# Patient Record
Sex: Male | Born: 1950
Health system: Southern US, Community
[De-identification: ages and names within clinical notes are randomized; demographics above are authoritative.]

## PROBLEM LIST (undated history)

## (undated) DIAGNOSIS — Z9889 Other specified postprocedural states: Secondary | ICD-10-CM

## (undated) DIAGNOSIS — I4510 Unspecified right bundle-branch block: Secondary | ICD-10-CM

## (undated) DIAGNOSIS — E559 Vitamin D deficiency, unspecified: Secondary | ICD-10-CM

## (undated) DIAGNOSIS — G4733 Obstructive sleep apnea (adult) (pediatric): Secondary | ICD-10-CM

## (undated) DIAGNOSIS — E785 Hyperlipidemia, unspecified: Secondary | ICD-10-CM

## (undated) DIAGNOSIS — I10 Essential (primary) hypertension: Secondary | ICD-10-CM

## (undated) DIAGNOSIS — G459 Transient cerebral ischemic attack, unspecified: Secondary | ICD-10-CM

## (undated) DIAGNOSIS — I679 Cerebrovascular disease, unspecified: Secondary | ICD-10-CM

## (undated) DIAGNOSIS — I2510 Atherosclerotic heart disease of native coronary artery without angina pectoris: Secondary | ICD-10-CM

## (undated) HISTORY — DX: Obstructive sleep apnea (adult) (pediatric): G47.33

## (undated) HISTORY — DX: Vitamin D deficiency, unspecified: E55.9

## (undated) HISTORY — DX: Hyperlipidemia, unspecified: E78.5

## (undated) HISTORY — DX: Transient cerebral ischemic attack, unspecified: G45.9

## (undated) HISTORY — DX: Other specified postprocedural states: Z98.890

## (undated) HISTORY — DX: Unspecified right bundle-branch block: I45.10

---

## 2001-09-18 ENCOUNTER — Encounter: Payer: Self-pay | Admitting: Cardiology

## 2001-09-18 ENCOUNTER — Ambulatory Visit (HOSPITAL_COMMUNITY): Admission: RE | Admit: 2001-09-18 | Discharge: 2001-09-18 | Payer: Self-pay | Admitting: Cardiology

## 2001-09-22 ENCOUNTER — Ambulatory Visit (HOSPITAL_COMMUNITY): Admission: RE | Admit: 2001-09-22 | Discharge: 2001-09-22 | Payer: Self-pay | Admitting: *Deleted

## 2011-04-28 DIAGNOSIS — R002 Palpitations: Secondary | ICD-10-CM

## 2013-01-23 ENCOUNTER — Other Ambulatory Visit (INDEPENDENT_AMBULATORY_CARE_PROVIDER_SITE_OTHER): Payer: Self-pay | Admitting: Otolaryngology

## 2013-01-23 DIAGNOSIS — H905 Unspecified sensorineural hearing loss: Secondary | ICD-10-CM

## 2013-02-15 ENCOUNTER — Ambulatory Visit
Admission: RE | Admit: 2013-02-15 | Discharge: 2013-02-15 | Disposition: A | Discharge status: Home / Self Care | Payer: 59 | Source: Ambulatory Visit | Attending: Otolaryngology | Admitting: Otolaryngology

## 2013-02-15 DIAGNOSIS — H905 Unspecified sensorineural hearing loss: Secondary | ICD-10-CM

## 2013-02-15 MED ORDER — GADOBENATE DIMEGLUMINE 529 MG/ML IV SOLN
20.0000 mL | Freq: Once | INTRAVENOUS | Status: AC | PRN
  Administered 2013-02-15: 20 mL via INTRAVENOUS

## 2013-10-26 DIAGNOSIS — E559 Vitamin D deficiency, unspecified: Secondary | ICD-10-CM | POA: Insufficient documentation

## 2013-10-26 DIAGNOSIS — R5381 Other malaise: Secondary | ICD-10-CM | POA: Insufficient documentation

## 2015-10-08 ENCOUNTER — Encounter: Payer: Self-pay | Admitting: *Deleted

## 2015-10-09 ENCOUNTER — Encounter: Payer: Self-pay | Admitting: Cardiology

## 2015-10-09 ENCOUNTER — Encounter: Payer: Self-pay | Admitting: *Deleted

## 2015-10-09 ENCOUNTER — Ambulatory Visit (INDEPENDENT_AMBULATORY_CARE_PROVIDER_SITE_OTHER): Payer: 59 | Admitting: Cardiology

## 2015-10-09 VITALS — BP 158/80 | HR 67 | Ht 71.0 in | Wt 225.0 lb

## 2015-10-09 DIAGNOSIS — R030 Elevated blood-pressure reading, without diagnosis of hypertension: Secondary | ICD-10-CM | POA: Diagnosis not present

## 2015-10-09 DIAGNOSIS — R002 Palpitations: Secondary | ICD-10-CM | POA: Diagnosis not present

## 2015-10-09 DIAGNOSIS — R0600 Dyspnea, unspecified: Secondary | ICD-10-CM

## 2015-10-09 DIAGNOSIS — R55 Syncope and collapse: Secondary | ICD-10-CM

## 2015-10-09 DIAGNOSIS — IMO0001 Reserved for inherently not codable concepts without codable children: Secondary | ICD-10-CM

## 2015-10-09 DIAGNOSIS — R0609 Other forms of dyspnea: Secondary | ICD-10-CM | POA: Diagnosis not present

## 2015-10-09 DIAGNOSIS — G4733 Obstructive sleep apnea (adult) (pediatric): Secondary | ICD-10-CM

## 2015-10-09 NOTE — Patient Instructions (Signed)
Your physician recommends that you continue on your current medications as directed. Please refer to the Current Medication list given to you today. Your physician has requested that you have an echocardiogram. Echocardiography is a painless test that uses sound waves to create images of your heart. It provides your doctor with information about the size and shape of your heart and how well your heart's chambers and valves are working. This procedure takes approximately one hour. There are no restrictions for this procedure. Your physician has requested that you have en exercise stress myoview. For further information please visit [email or web address]. Please follow instruction sheet, as given. Your physician has recommended that you wear an event monitor. Event monitors are medical devices that record the heart's electrical activity. Doctors most often [place] these monitors to diagnose arrhythmias. Arrhythmias are problems with the speed or rhythm of the heartbeat. The monitor is a small, portable device. You can wear one while you do your normal daily activities. This is usually used to diagnose what is causing palpitations/syncope (passing out). Your physician recommends that you schedule a follow-up appointment in: 6 weeks.

## 2015-10-09 NOTE — Progress Notes (Signed)
Cardiology Office Note  Date: 10/09/2015   ID: Jesus Juarez, DOB Jun 06, 1950, MRN [id number]  PCP: Rory Percy, MD  Consulting Cardiologist: Rozann Lesches, MD   Chief Complaint  Patient presents with  . Exertional dyspnea  . Near Syncope  . Palpitations    History of Present Illness: Jesus Juarez is a 65 y.o. male referred for cardiology consultation by Dr. Nadara Mustard. He is here today with his wife. He states that over the last few months he has been more aware of a feeling of exertional shortness of breath and fatigue. He and his wife enjoy walking in his neighborhood, and with his typical route he reports feeling more breathless without any other obvious change in speed or effort. Also feels a vague discomfort on the right side of his chest and sometimes a brief sense of mild palpitations.  He describes a more distinct episode that occurred while he was visiting his son, was playing with his grandchild on the floor, stood up and then walked toward the door when he suddenly felt a sense of rapid palpitations and lightheadedness causing him to almost fall down to the floor. He had to catch himself. This lasted for approximately 30 seconds to a minute.  He reports previous cardiac evaluation approximately 20 years ago at which time he had normal coronary arteries at heart catheterization (I cannot locate the formal report). He has not undergone any interval ischemic or structural cardiac testing. No family history of premature CAD, but he does state that his mother has SVT.  He has also had elevations in blood pressure that are being followed by Dr. Nadara Mustard. He was not able to tolerate lisinopril approximately one year ago. He is not on any antihypertensive medicines at this time. He also has a history of long-standing obstructive sleep apnea. He does not feel rested in the morning, does use CPAP regularly. Dr. Nadara Mustard is working on getting him a follow-up sleep study assessment.  He is  retired from [employer]. Tries to stay active. No unusual degree of stress.  Past Medical History  Diagnosis Date  . Obstructive sleep apnea   . Vitamin D deficiency   . History of cardiac catheterization     Normal coronaries - approximately 20 years ago    History reviewed. No pertinent past surgical history.  Current Outpatient Prescriptions  Medication Sig Dispense Refill  . aspirin 81 MG tablet Take 81 mg by mouth. occasionally    . B Complex Vitamins (VITAMIN-B COMPLEX PO) Take by mouth. Occasionally    . cholecalciferol (VITAMIN D) 1000 units tablet Take 1,000 Units by mouth 2 (two) times daily.    . Multiple Vitamin (MULTIVITAMIN) tablet Take 1 tablet by mouth daily.     No current facility-administered medications for this visit.   Allergies:  Penicillins   Social History: The patient  reports that he has never smoked. He does not have any smokeless tobacco history on file. He reports that he does not drink alcohol or use illicit drugs.   Family History: The patient's family history includes Hypertension in his mother; Supraventricular tachycardia in his mother.   ROS:  Please see the history of present illness. Otherwise, complete review of systems is positive for occasional headaches.  All other systems are reviewed and negative.   Physical Exam: VS:  BP 178/98 mmHg  Pulse 72  Ht 5\' 11"  (1.803 m)  Wt 225 lb (102.059 kg)  BMI 31.39 kg/m2  SpO2 98%, BMI Body mass  index is 31.39 kg/(m^2).  Wt Readings from Last 3 Encounters:  10/09/15 225 lb (102.059 kg)  10/02/15 220 lb (99.791 kg)    General: Patient appears comfortable at rest. HEENT: Conjunctiva and lids normal, oropharynx clear. Neck: Supple, no elevated JVP or carotid bruits, no thyromegaly. Lungs: Clear to auscultation, nonlabored breathing at rest. Cardiac: Regular rate and rhythm, no S3 or significant systolic murmur, no pericardial rub. Abdomen: Soft, nontender, bowel sounds present, no guarding or  rebound. Extremities: No pitting edema, distal pulses 2+. Skin: Warm and dry. Musculoskeletal: No kyphosis. Neuropsychiatric: Alert and oriented x3, affect grossly appropriate.  ECG: I personally reviewed the tracing from 10/02/2015 which showed normal sinus rhythm.  Recent Labwork:  March 2017: Cholesterol 183, triglycerides 216, HDL 40, LDL 100, hemoglobin 15.3, platelets 247, BUN 16, creatinine 1.2, potassium 4.6, AST 17, ALT 14   Other Studies Reviewed Today:  Brain MRI 02/15/2013: FINDINGS: Stable and normal for age cerebral volume. No restricted diffusion to suggest acute infarction. No midline shift, mass effect, evidence of mass lesion, ventriculomegaly, extra-axial collection or acute intracranial hemorrhage. Cervicomedullary junction and pituitary are within normal limits. Negative visualized cervical spine. Major intracranial vascular flow voids are stable. Stable and normal for age gray and white matter signal throughout the brain. No abnormal gray or white matter enhancement.  There is a benign appearing right parietal bone lesion with intrinsic increased T1 and T2 signal. No abnormal diffusion. This is unchanged and compatible with a small benign osseous hemangioma. Elsewhere normal bone marrow signal. Visualized orbit soft tissues are within normal limits. Minor paranasal sinus mucosal thickening. Negative scalp soft tissues.  Dedicated internal auditory canal imaging. Normal cerebellopontine angles. Normal bilateral cisternal and intracanalicular 7th and 8th cranial nerves segments. Preserved T2 signal in the cochlea and vestibular structures. Trace mastoid fluid, more so on the left. Negative visualized nasopharynx. No abnormal enhancement identified.  IMPRESSION: 1. Normal IAC imaging aside from trace mastoid fluid, significance doubtful.  2. Stable and normal MRI appearance of the brain.  Assessment and Plan:  1. Newly recognized exertional dyspnea  and fatigue over the last few months. He has had elevated blood pressure, currently not on antihypertensive regimen, recent LDL 100. Reportedly underwent reassuring cardiac evaluation approximately 20 years ago. He also has long-standing obstructive sleep apnea and there is potential that he is not getting adequate treatment with his current CPAP regimen. He has not had any interval ischemic or structural cardiac testing and we have discussed arranging an echocardiogram as well as an exercise Cardiolite for objective evaluation.  2. Intermittent palpitations and episode of near-syncope as outlined above. I reviewed his recent ECG. We will obtain a 30 day event recorder to try to investigate this further. He does not report very frequent palpitations but the most recent event was fairly prominent.  3. Elevated blood pressure, not on long-standing antihypertensives. Reportedly did not tolerate lisinopril about one year ago. Keep follow-up with Dr. Nadara Mustard for further treatment.  4. Long-standing obstructive sleep apnea, on CPAP.  Current medicines were reviewed with the patient today.   Orders Placed This Encounter  Procedures  . NM Myocar Multi W/Spect W/Wall Motion / EF  . Cardiac event monitor  . ECHOCARDIOGRAM COMPLETE    Disposition: FU with me after testing.   Signed, Satira Sark, MD, [facility] 10/09/2015 9:43 AM    [facility] [place], [place], [place] [address] Phone: [phone]; Fax: [phone]

## 2015-10-09 NOTE — Telephone Encounter (Signed)
This encounter was created in error - please disregard.

## 2015-10-15 ENCOUNTER — Other Ambulatory Visit: Payer: Self-pay

## 2015-10-15 ENCOUNTER — Ambulatory Visit (INDEPENDENT_AMBULATORY_CARE_PROVIDER_SITE_OTHER): Payer: 59

## 2015-10-15 DIAGNOSIS — R55 Syncope and collapse: Secondary | ICD-10-CM | POA: Diagnosis not present

## 2015-10-15 DIAGNOSIS — R0609 Other forms of dyspnea: Secondary | ICD-10-CM | POA: Diagnosis not present

## 2015-10-15 DIAGNOSIS — R0600 Dyspnea, unspecified: Secondary | ICD-10-CM

## 2015-10-15 LAB — ECHOCARDIOGRAM COMPLETE
AVLVOTPG: 8 mmHg
CHL CUP MV DEC (S): 235
EERAT: 10.84
EWDT: 235 ms
FS: 30 % (ref 28–44)
IVS/LV PW RATIO, ED: 1.09
LA diam end sys: 39 mm
LA vol index: 28 mL/m2
LA vol: 64 mL
LADIAMINDEX: 1.7 cm/m2
LASIZE: 39 mm
LAVOLA4C: 63.4 mL
LV PW d: 11.7 mm — AB (ref 0.6–1.1)
LV SIMPSON'S DISK: 64
LV TDI E'LATERAL: 7.16
LV TDI E'MEDIAL: 6.58
LV dias vol index: 35 mL/m2
LV sys vol: 29 mL (ref 21–61)
LVDIAVOL: 81 mL (ref 62–150)
LVEEAVG: 10.84
LVEEMED: 10.84
LVELAT: 7.16 cm/s
LVOT VTI: 29.7 cm
LVOT area: 3.14 cm2
LVOT peak vel: 137 cm/s
LVOTD: 20 mm
LVOTSV: 93 mL
LVSYSVOLIN: 13 mL/m2
MV Peak grad: 2 mmHg
MVPKAVEL: 102 m/s
MVPKEVEL: 77.6 m/s
Stroke v: 52 ml
TAPSE: 23.6 mm

## 2015-10-16 ENCOUNTER — Telehealth: Payer: Self-pay | Admitting: *Deleted

## 2015-10-16 NOTE — Telephone Encounter (Signed)
-----   Message from Satira Sark, MD sent at 10/16/2015 11:42 AM EDT ----- Results reviewed. LVEF is normal range which is reassuring. He has mild diastolic dysfunction (LV stiffness) which could go along with elevated blood pressure and his OSA. No definite findings to explain increasing dyspnea on exertion by this test. Await stress testing. A copy of this test should be forwarded to Rory Percy, MD.

## 2015-10-16 NOTE — Telephone Encounter (Addendum)
Patient informed and copy sent to PCP. 

## 2015-10-21 ENCOUNTER — Encounter (HOSPITAL_COMMUNITY): Payer: Self-pay

## 2015-10-21 ENCOUNTER — Encounter (HOSPITAL_COMMUNITY)
Admission: RE | Admit: 2015-10-21 | Discharge: 2015-10-21 | Disposition: A | Discharge status: Home / Self Care | Payer: 59 | Source: Ambulatory Visit | Attending: Cardiology | Admitting: Cardiology

## 2015-10-21 ENCOUNTER — Inpatient Hospital Stay (HOSPITAL_COMMUNITY): Admission: RE | Admit: 2015-10-21 | Payer: 59 | Source: Ambulatory Visit

## 2015-10-21 DIAGNOSIS — R0600 Dyspnea, unspecified: Secondary | ICD-10-CM

## 2015-10-21 DIAGNOSIS — R0609 Other forms of dyspnea: Secondary | ICD-10-CM | POA: Diagnosis not present

## 2015-10-21 DIAGNOSIS — R55 Syncope and collapse: Secondary | ICD-10-CM | POA: Diagnosis present

## 2015-10-21 HISTORY — DX: Essential (primary) hypertension: I10

## 2015-10-21 LAB — NM MYOCAR MULTI W/SPECT W/WALL MOTION / EF
CHL CUP NUCLEAR SDS: 2
CHL CUP NUCLEAR SRS: 0
CHL CUP NUCLEAR SSS: 2
Estimated workload: 10.1 METS
Exercise duration (min): 8 min
Exercise duration (sec): 14 s
LHR: 0.3
LV sys vol: 37 mL
LVDIAVOL: 90 mL (ref 62–150)
MPHR: 156 {beats}/min
Peak HR: 142 {beats}/min
Percent HR: 91 %
RPE: 13
Rest HR: 62 {beats}/min
TID: 1.01

## 2015-10-21 MED ORDER — REGADENOSON 0.4 MG/5ML IV SOLN
INTRAVENOUS | Status: AC
  Filled 2015-10-21: qty 5

## 2015-10-21 MED ORDER — SODIUM CHLORIDE 0.9% FLUSH
INTRAVENOUS | Status: AC
  Administered 2015-10-21: 10 mL via INTRAVENOUS
  Filled 2015-10-21: qty 10

## 2015-10-21 MED ORDER — TECHNETIUM TC 99M TETROFOSMIN IV KIT
30.0000 | PACK | Freq: Once | INTRAVENOUS | Status: AC | PRN
  Administered 2015-10-21: 30.5 via INTRAVENOUS

## 2015-10-21 MED ORDER — TECHNETIUM TC 99M TETROFOSMIN IV KIT
10.0000 | PACK | Freq: Once | INTRAVENOUS | Status: AC | PRN
  Administered 2015-10-21: 10.9 via INTRAVENOUS

## 2015-10-22 ENCOUNTER — Ambulatory Visit (INDEPENDENT_AMBULATORY_CARE_PROVIDER_SITE_OTHER): Payer: 59

## 2015-10-22 ENCOUNTER — Telehealth: Payer: Self-pay | Admitting: Cardiology

## 2015-10-22 DIAGNOSIS — R55 Syncope and collapse: Secondary | ICD-10-CM

## 2015-10-22 DIAGNOSIS — R002 Palpitations: Secondary | ICD-10-CM | POA: Diagnosis not present

## 2015-10-22 NOTE — Telephone Encounter (Signed)
Copy to pmd.   

## 2015-10-22 NOTE — Telephone Encounter (Signed)
Would like results of stress test

## 2015-10-22 NOTE — Telephone Encounter (Signed)
Notes Recorded by Laurine Blazer, LPN on 075-GRM at 624THL PM Patient notified via voice mail.  -------------------------------------------------------------------------------------------- Satira Sark, MD  Sent: Wed October 22, 2015 4:13 PM   To: Laurine Blazer, LPN    Sometimes there can be abnormal ST segment changes on the exercise portion of a Cardiolite study that do not necessarily correspond to a perfusion abnormality or diagnostic CAD. In this case, these changes are a less concerning abnormality when the actual perfusion images are normal.  Notes Recorded by Laurine Blazer, LPN on 075-GRM at 579FGE PM Patient notified. Patient wanting more explanation in regards to the abnormal ST segment changes noted. Did explain nonspecific finding in regards to normal stress test without any perfusion defects.  Notes Recorded by Satira Sark, MD on 10/21/2015 at 3:00 PM Results reviewed. Reassuring overall in that the perfusion imaging was normal as was LVEF. The abnormal ST segment changes are noted, however less specific in the setting of normal myocardial perfusion. Do not necessarily need to push forward with further cardiac testing, although if his symptoms continue and do not improve with treatment of OSA, we can always discuss a cardiac catheterization. A copy of this test should be forwarded to Rory Percy, MD.

## 2015-11-27 NOTE — Progress Notes (Deleted)
Cardiology Office Note  Date: 11/27/2015   ID: Jesus Juarez, DOB 1950-10-13, MRN [id number]  PCP: Rory Percy, MD  Primary Cardiologist: Rozann Lesches, MD   No chief complaint on file.   History of Present Illness: Jesus Juarez is a 65 y.o. male seen in consultation back in June for evaluation of exertional dyspnea and fatigue as well as an episode of palpitations and reported near syncope. He was referred for further cardiac testing.  Echocardiogram and Myoview results are outlined below. He had normal LVEF with mild diastolic dysfunction and no major valvular abnormalities. Abnormal ST segment changes were seen on GXT portion of his stress test, however perfusion imaging was normal. He also wore a 30 day event recorder which showed sinus rhythm and sinus tachycardia, no arrhythmias or pauses.   Past Medical History:  Diagnosis Date  . History of cardiac catheterization    Normal coronaries - approximately 20 years ago  . Hypertension   . Obstructive sleep apnea   . Vitamin D deficiency     No past surgical history on file.  Current Outpatient Prescriptions  Medication Sig Dispense Refill  . aspirin 81 MG tablet Take 81 mg by mouth. occasionally    . B Complex Vitamins (VITAMIN-B COMPLEX PO) Take by mouth. Occasionally    . cholecalciferol (VITAMIN D) 1000 units tablet Take 1,000 Units by mouth 2 (two) times daily.    . Multiple Vitamin (MULTIVITAMIN) tablet Take 1 tablet by mouth daily.     No current facility-administered medications for this visit.    Allergies:  Penicillins   Social History: The patient  reports that he has never smoked. He does not have any smokeless tobacco history on file. He reports that he does not drink alcohol or use drugs.   Family History: The patient's family history includes Hypertension in his mother; Supraventricular tachycardia in his mother.   ROS:  Please see the history of present illness. Otherwise, complete review of  systems is positive for {NONE DEFAULTED:18576::"none"}.  All other systems are reviewed and negative.   Physical Exam: VS:  There were no vitals taken for this visit., BMI There is no height or weight on file to calculate BMI.  Wt Readings from Last 3 Encounters:  10/09/15 225 lb (102.1 kg)  10/02/15 220 lb (99.8 kg)    General: Patient appears comfortable at rest. HEENT: Conjunctiva and lids normal, oropharynx clear with moist mucosa. Neck: Supple, no elevated JVP or carotid bruits, no thyromegaly. Lungs: Clear to auscultation, nonlabored breathing at rest. Cardiac: Regular rate and rhythm, no S3 or significant systolic murmur, no pericardial rub. Abdomen: Soft, nontender, no hepatomegaly, bowel sounds present, no guarding or rebound. Extremities: No pitting edema, distal pulses 2+. Skin: Warm and dry. Musculoskeletal: No kyphosis. Neuropsychiatric: Alert and oriented x3, affect grossly appropriate.  ECG: I personally reviewed the tracing from 10/02/2015 which showed normal sinus rhythm.  Recent Labwork:  March 2017: Cholesterol 183, triglycerides 216, HDL 40, LDL 100, hemoglobin 15.3, platelets 247, BUN 16, creatinine 1.2, potassium 4.6, AST 17, ALT 14   Other Studies Reviewed Today:  Echocardiogram 10/15/2015: Study Conclusions  - Left ventricle: The cavity size was normal. Wall thickness was   increased in a pattern of mild LVH. Systolic function was normal.   The estimated ejection fraction was in the range of 60% to 65%.   Wall motion was normal; there were no regional wall motion   abnormalities. Doppler parameters are consistent with abnormal  left ventricular relaxation (grade 1 diastolic dysfunction). - Mitral valve: Calcified annulus. - Left atrium: The atrium was at the upper limits of normal in   size. - Right atrium: The atrium was mildly dilated. Central venous   pressure (est): 3 mm Hg. - Atrial septum: No defect or patent foramen ovale was identified. -  Tricuspid valve: There was physiologic regurgitation. - Pulmonary arteries: PA peak pressure: 16 mm Hg (S). - Pericardium, extracardiac: There was no pericardial effusion.  Impressions:  - Mild LVH with LVEF 60-65%. Grade 1 diastolic dysfunction with   normal estimated LV filling pressure. Mildly calcified mitral   annulus. Upper normal left atrial chamber size. Normal estimated   PASP 16 mmHg.  Exercise Myoview 10/21/2015:  Blood pressure demonstrated a hypertensive response to exercise.  Horizontal ST segment depression ST segment depression of 2 mm was noted during stress in the II, III and aVF leads. Nonspecific finding in setting of hypertensive response  The study is normal. There are no myocardial perfusion defects.  This is a low risk study.  The left ventricular ejection fraction is normal (55-65%).  Assessment and Plan:    Current medicines were reviewed with the patient today.  No orders of the defined types were placed in this encounter.   Disposition:  Signed, Satira Sark, MD, [facility] 11/27/2015 8:00 AM    [facility] [place], [place], [place] [address] Phone: [phone]; Fax: [phone]

## 2015-11-28 ENCOUNTER — Ambulatory Visit: Payer: 59 | Admitting: Cardiology

## 2015-11-28 ENCOUNTER — Telehealth: Payer: Self-pay | Admitting: Cardiology

## 2015-11-28 NOTE — Telephone Encounter (Signed)
Patient informed and copy sent to PCP. 

## 2015-11-28 NOTE — Telephone Encounter (Signed)
-----   Message from Satira Sark, MD sent at 11/27/2015  8:36 AM EDT ----- Results reviewed. Will discuss at office visit pending for tomorrow. No arrhythmias were noted. A copy of this test should be forwarded to Rory Percy, MD.

## 2015-11-28 NOTE — Telephone Encounter (Signed)
Jesus Juarez had to cancel his appointment today due to be out of town.  He has re-scheduled. He would like to know the results of his heart monitor.  Please call cell #.

## 2015-12-30 ENCOUNTER — Encounter: Payer: Self-pay | Admitting: Cardiology

## 2015-12-30 ENCOUNTER — Ambulatory Visit (INDEPENDENT_AMBULATORY_CARE_PROVIDER_SITE_OTHER): Payer: 59 | Admitting: Cardiology

## 2015-12-30 VITALS — BP 175/91 | HR 73 | Ht 71.0 in | Wt 225.2 lb

## 2015-12-30 DIAGNOSIS — R0609 Other forms of dyspnea: Secondary | ICD-10-CM | POA: Diagnosis not present

## 2015-12-30 DIAGNOSIS — IMO0001 Reserved for inherently not codable concepts without codable children: Secondary | ICD-10-CM

## 2015-12-30 DIAGNOSIS — G4733 Obstructive sleep apnea (adult) (pediatric): Secondary | ICD-10-CM

## 2015-12-30 DIAGNOSIS — R002 Palpitations: Secondary | ICD-10-CM | POA: Diagnosis not present

## 2015-12-30 DIAGNOSIS — R030 Elevated blood-pressure reading, without diagnosis of hypertension: Secondary | ICD-10-CM

## 2015-12-30 DIAGNOSIS — Z9989 Dependence on other enabling machines and devices: Secondary | ICD-10-CM

## 2015-12-30 DIAGNOSIS — R0600 Dyspnea, unspecified: Secondary | ICD-10-CM

## 2015-12-30 NOTE — Patient Instructions (Addendum)
Your physician recommends that you schedule a follow-up appointment in: [place]  Your physician recommends that you continue on your current medications as directed. Please refer to the Current Medication list given to you today.  You have been referred to PULMONOLOGY   Thank you for choosing [facility]!!

## 2015-12-30 NOTE — Progress Notes (Signed)
Cardiology Office Note  Date: 12/30/2015   ID: Jesus Juarez, DOB [birth year], MRN [id number]  PCP: Jesus Percy, MD  Primary Cardiologist: Jesus Lesches, MD   Chief Complaint  Patient presents with  . Follow-up testing    History of Present Illness: Jesus Juarez is a 65 y.o. male seen in consultation back in June. He is a day with his wife for a follow-up visit. He does not report any progression and dyspnea exertion or episodes of orthostatic dizziness in the interim. No chest pain.  Echocardiogram and Myoview results are outlined below. 30 day event monitor was also reviewed showing sinus rhythm and sinus tachycardia with no obvious arrhythmias or pauses. He did have follow-up sleep testing as well with known history of severe OSA - this was a study done at home. Copy to be scanned into EPIC.  Today we went over the results of his testing. He has normal LVEF with some diastolic dysfunction, and I do think better blood pressure control will be important. He has been hesitant to try another antihypertensive having not tolerated lisinopril in the past. ARB may be a better choice, possibly even with a low dose diuretic. For now he wanted to hold off. He also needs evaluation for his sleep apnea management and we will be making a referral for further evaluation.  I suspect a sleep titration study may be needed since he is already on CPAP.  Past Medical History:  Diagnosis Date  . History of cardiac catheterization    Normal coronaries - approximately 20 years ago  . Hypertension   . Obstructive sleep apnea   . Vitamin D deficiency     History reviewed. No pertinent surgical history.  Current Outpatient Prescriptions  Medication Sig Dispense Refill  . aspirin 81 MG tablet Take 81 mg by mouth. occasionally    . B Complex Vitamins (VITAMIN-B COMPLEX PO) Take by mouth. Occasionally    . cholecalciferol (VITAMIN D) 1000 units tablet Take 1,000 Units by mouth 2 (two) times daily.     . Multiple Vitamin (MULTIVITAMIN) tablet Take 1 tablet by mouth daily.     No current facility-administered medications for this visit.    Allergies:  Penicillins   Social History: The patient  reports that he has never smoked. He has never used smokeless tobacco. He reports that he does not drink alcohol or use drugs.   ROS:  Please see the history of present illness. Otherwise, complete review of systems is positive for none.  All other systems are reviewed and negative.   Physical Exam: VS:  BP (!) 175/91   Pulse 73   Ht 5\' 11"  (1.803 m)   Wt 225 lb 3.2 oz (102.2 kg)   SpO2 97%   BMI 31.41 kg/m , BMI Body mass index is 31.41 kg/m.  Wt Readings from Last 3 Encounters:  12/30/15 225 lb 3.2 oz (102.2 kg)  10/09/15 225 lb (102.1 kg)  10/02/15 220 lb (99.8 kg)    General: Patient appears comfortable at rest. HEENT: Conjunctiva and lids normal, oropharynx clear. Neck: Supple, no elevated JVP or carotid bruits, no thyromegaly. Lungs: Clear to auscultation, nonlabored breathing at rest. Cardiac: Regular rate and rhythm, no S3 or significant systolic murmur, no pericardial rub. Abdomen: Soft, nontender, bowel sounds present, no guarding or rebound. Extremities: No pitting edema, distal pulses 2+.  ECG: I personally reviewed the tracing from 10/02/2015 which showed normal sinus rhythm.  Recent Labwork:  March 2017: Cholesterol 183, triglycerides  216, HDL 40, LDL 100, hemoglobin 15.3, platelets 247, BUN 16, creatinine 1.2, potassium 4.6, AST 17, ALT 14   Other Studies Reviewed Today:  Echocardiogram 10/15/2015: Study Conclusions  - Left ventricle: The cavity size was normal. Wall thickness was   increased in a pattern of mild LVH. Systolic function was normal.   The estimated ejection fraction was in the range of 60% to 65%.   Wall motion was normal; there were no regional wall motion   abnormalities. Doppler parameters are consistent with abnormal   left ventricular  relaxation (grade 1 diastolic dysfunction). - Mitral valve: Calcified annulus. - Left atrium: The atrium was at the upper limits of normal in   size. - Right atrium: The atrium was mildly dilated. Central venous   pressure (est): 3 mm Hg. - Atrial septum: No defect or patent foramen ovale was identified. - Tricuspid valve: There was physiologic regurgitation. - Pulmonary arteries: PA peak pressure: 16 mm Hg (S). - Pericardium, extracardiac: There was no pericardial effusion.  Impressions:  - Mild LVH with LVEF 60-65%. Grade 1 diastolic dysfunction with   normal estimated LV filling pressure. Mildly calcified mitral   annulus. Upper normal left atrial chamber size. Normal estimated   PASP 16 mmHg.  Exercise Myoview 10/21/2015:  Blood pressure demonstrated a hypertensive response to exercise.  Horizontal ST segment depression ST segment depression of 2 mm was noted during stress in the II, III and aVF leads. Nonspecific finding in setting of hypertensive response  The study is normal. There are no myocardial perfusion defects.  This is a low risk study.  The left ventricular ejection fraction is normal (55-65%).  Assessment and Plan:  1. Severe obstructive sleep apnea on CPAP. Recent home sleep evaluation results are being scanned into EPIC. We are referring him for further evaluation of his OSA, he may need to have a sleep titration study.  2. Elevated blood pressure. We again talked about antihypertensive therapy, also hoping that perhaps better control of his OSA will be helpful. He is hesitant to start new medications now. We may consider an ARB plus minus low-dose diuretic. This would also be important with diastolic dysfunction and could help with dyspnea on exertion.  3. Still expenses intermittent brief palpitations but has had no recurring dizziness, and no syncope. Cardiac monitor did not demonstrate any arrhythmias.  Current medicines were reviewed with the patient  today.   Orders Placed This Encounter  Procedures  . Ambulatory referral to Pulmonology    Disposition: Follow-up with me in 3 months.  Signed, Jesus Sark, MD, [facility] 12/30/2015 4:17 PM    [facility] [place], [place], [place] [address] Phone: [phone]; Fax: [phone]

## 2016-03-08 ENCOUNTER — Ambulatory Visit (INDEPENDENT_AMBULATORY_CARE_PROVIDER_SITE_OTHER): Payer: 59 | Admitting: Pulmonary Disease

## 2016-03-08 ENCOUNTER — Encounter: Payer: Self-pay | Admitting: Pulmonary Disease

## 2016-03-08 DIAGNOSIS — G4733 Obstructive sleep apnea (adult) (pediatric): Secondary | ICD-10-CM

## 2016-03-08 NOTE — Progress Notes (Signed)
Subjective:    Patient ID: Jesus Juarez, male    DOB: 25-Oct-1950, 65 y.o.   MRN: [id number]  HPI   Chief Complaint  Patient presents with  . Sleep Consult    Referred by Dr. Domenic Polite,  Pt. has had a sleep study 2-3 months ago, witnessed apneas, Pt. does currently uses a CPAP machine, Pt. c/o daytime somnolence, still waking up frequentlly at night, DME: [facility]    65 year old never smoker presents for management of obstructive sleep apnea. He was diagnosed more than 15 years ago and since then is maintained on CPAP about 11 cm with a full face mask with good improvement in his daytime somnolence and energy levels. He had an episode of presyncope about 4 months ago and underwent cardiology evaluation including stress test, echo and a 30 day Holter monitor-which did not reveal a cause. PCP undertook home sleep testing 12/2015 which showed severe OSA with AHI 49/hour. 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 Bedtime is around 10 PM, sleep latency is minimal, he sleeps on his side with one pillow, reports 3-4 nocturnal awakenings and is out of bed by 7 AM feeling tired without dryness of mouth or headaches. He denies nocturia He is not gained significant weight over the past few years. His wife sleeps in a different room but has not heard snoring through his CPAP mask   Past Medical History:  Diagnosis Date  . History of cardiac catheterization    Normal coronaries - approximately 20 years ago  . Hypertension   . Obstructive sleep apnea   . Vitamin D deficiency      No past surgical history on file.   Allergies  Allergen Reactions  . Penicillins Hives     Social History   Social History  . Marital  status: Married    Spouse name: N/A  . Number of children: N/A  . Years of education: N/A   Occupational History  . Not on file.   Social History Main Topics  . Smoking status: Never Smoker  . Smokeless tobacco: Never Used  . Alcohol use No  . Drug use: No  . Sexual activity: Not on file   Other Topics Concern  . Not on file   Social History Narrative  . No narrative on file     Family History  Problem Relation Age of Onset  . Supraventricular tachycardia Mother   . Hypertension Mother       Review of Systems Constitutional: negative for anorexia, fevers and sweats  Eyes: negative for irritation, redness and visual disturbance  Ears, nose, mouth, throat, and face: negative for earaches, epistaxis, nasal congestion and sore throat  Respiratory: negative for cough, dyspnea on exertion, sputum and wheezing  Cardiovascular: negative for chest pain, dyspnea, lower extremity edema, orthopnea, palpitations and syncope  Gastrointestinal: negative for abdominal pain, constipation, diarrhea, melena, nausea and vomiting  Genitourinary:negative for dysuria, frequency and hematuria  Hematologic/lymphatic: negative for bleeding, easy bruising and lymphadenopathy  Musculoskeletal:negative for arthralgias, muscle weakness and stiff joints  Neurological: negative for coordination problems, gait problems, headaches and weakness  Endocrine: negative for diabetic symptoms including polydipsia, polyuria and weight loss     Objective:  Physical Exam  Gen. Pleasant, well-nourished, in no distress, normal affect ENT - no lesions, no post nasal drip, class 2 airway Neck: No JVD, no thyromegaly, no carotid bruits Lungs: no use of accessory muscles, no dullness to percussion, clear without rales or rhonchi  Cardiovascular: Rhythm regular, heart sounds  normal, no murmurs or gallops, no peripheral edema Abdomen: soft and non-tender, no hepatosplenomegaly, BS normal. Musculoskeletal: No  deformities, no cyanosis or clubbing Neuro:  alert, non focal       Assessment & Plan:

## 2016-03-08 NOTE — Progress Notes (Signed)
   Subjective:    Patient ID: Jesus Juarez, male    DOB: 1950/08/12, 65 y.o.   MRN: [id number]  HPI    Review of Systems  Constitutional: Negative.  Negative for fever and unexpected weight change.  HENT: Positive for sinus pressure. Negative for congestion, dental problem, ear pain, nosebleeds, postnasal drip, rhinorrhea, sneezing, sore throat and trouble swallowing.   Eyes: Negative.  Negative for redness and itching.  Respiratory: Positive for shortness of breath. Negative for cough, chest tightness and wheezing.   Cardiovascular: Negative.  Negative for palpitations and leg swelling.  Gastrointestinal: Negative.  Negative for nausea and vomiting.  Endocrine: Negative.   Genitourinary: Negative.  Negative for dysuria.  Musculoskeletal: Negative.  Negative for joint swelling.  Skin: Negative.  Negative for rash.  Allergic/Immunologic: Positive for environmental allergies.  Neurological: Positive for dizziness. Negative for headaches.  Hematological: Negative.  Does not bruise/bleed easily.  Psychiatric/Behavioral: Negative.  Negative for dysphoric mood. The patient is not nervous/anxious.        Objective:   Physical Exam        Assessment & Plan:

## 2016-03-08 NOTE — Patient Instructions (Signed)
CPAP titration study

## 2016-03-08 NOTE — Assessment & Plan Note (Signed)
Weight loss encouraged, compliance with goal of at least 4-6 hrs every night is the expectation. Advised against medications with sedative side effects Cautioned against driving when sleepy - understanding that sleepiness will vary on a day to day basis   Given that it's been so many years we'll proceed with a CPAP titration study We will also obtain a download of his machine and ensure that his settings are appropriate I reassured him that OSA is not related to his episode of syncope

## 2016-03-15 ENCOUNTER — Telehealth: Payer: Self-pay

## 2016-03-15 NOTE — Telephone Encounter (Signed)
Tried to call Pt. About his download that Dr. Elsworth Soho review.   He stated to inform pt. Good usage. CPAP 10cm effective. No changes at this time.

## 2016-03-16 ENCOUNTER — Telehealth: Payer: Self-pay | Admitting: Pulmonary Disease

## 2016-03-16 NOTE — Telephone Encounter (Signed)
Returned Pt. Call, another phone message was placed and sent to Dr. Elsworth Soho

## 2016-03-16 NOTE — Telephone Encounter (Signed)
Dr. Elsworth Soho  I informed this pt. About your message when you took a look his download and stated "Good usage. CPAP 10cm effective. No changes at this time." He is suppose to have a CPAP Titration 04/23/2016. Since he is doing well on his current therapy, he wanted to know if you still wanted him to go through with this.

## 2016-03-17 NOTE — Telephone Encounter (Signed)
Spoke with patient-aware that CPAP titration can be cancelled per RA since current settings confirmed okay on most recent download.   Pt noted fatigue going on and wonders if having CPAP titration could show he may need different settings. States his PCP questions if his sleep and CPAP usage/pressure settings are causing the fatigue. Pt is willing to have CPAP titration.   RA can we keep order placed? Thanks.

## 2016-03-17 NOTE — Telephone Encounter (Signed)
Spoke with the pt  He already has appt to have CPAP titration  Nothing further needed

## 2016-03-17 NOTE — Telephone Encounter (Signed)
Okay to proceed with CPAP titration

## 2016-03-17 NOTE — Telephone Encounter (Signed)
OK to cancel - since current settings  Confirmed OK

## 2016-03-22 ENCOUNTER — Encounter: Payer: Self-pay | Admitting: Pulmonary Disease

## 2016-04-12 ENCOUNTER — Ambulatory Visit: Payer: 59 | Admitting: Cardiology

## 2016-04-22 ENCOUNTER — Telehealth: Payer: Self-pay | Admitting: *Deleted

## 2016-04-22 NOTE — Telephone Encounter (Signed)
-----   Message from Rigoberto Noel, MD sent at 04/22/2016  2:33 PM EST ----- Nida Manfredi, Pl let him know Also CPAP 10 cm seems to be adequate based on download  RA ----- Message ----- From: Joellen Jersey Sent: 04/22/2016  12:23 PM To: Rigoberto Noel, MD, Virl Cagey, CMA  [employer] HAS DENIED THIS PT'S CPAP TITRATION STUDY I HAVE CANCELLED THE APPT NOT SURE WHAT YOU WANT TO DO FOR HIM HE IS AWARE AND WILL AWAIT TO HEAR FROM YOU THANKS LIBBY

## 2016-04-23 NOTE — Telephone Encounter (Signed)
lmtcb x1 for pt. 

## 2016-04-27 NOTE — Telephone Encounter (Signed)
Pt called back and he is aware of RA recs of the cpap download.  Pt voiced his understanding and nothing further is needed.

## 2016-04-27 NOTE — Telephone Encounter (Signed)
lmomtcb x 2  

## 2016-04-28 ENCOUNTER — Encounter (HOSPITAL_BASED_OUTPATIENT_CLINIC_OR_DEPARTMENT_OTHER): Payer: 59

## 2016-05-18 ENCOUNTER — Ambulatory Visit: Payer: 59 | Admitting: Cardiology

## 2016-05-26 DIAGNOSIS — R69 Illness, unspecified: Secondary | ICD-10-CM | POA: Diagnosis not present

## 2016-07-20 DIAGNOSIS — G4733 Obstructive sleep apnea (adult) (pediatric): Secondary | ICD-10-CM | POA: Diagnosis not present

## 2016-08-17 DIAGNOSIS — H8102 Meniere's disease, left ear: Secondary | ICD-10-CM | POA: Diagnosis not present

## 2016-08-17 DIAGNOSIS — H6123 Impacted cerumen, bilateral: Secondary | ICD-10-CM | POA: Diagnosis not present

## 2016-08-17 DIAGNOSIS — H9042 Sensorineural hearing loss, unilateral, left ear, with unrestricted hearing on the contralateral side: Secondary | ICD-10-CM | POA: Diagnosis not present

## 2016-09-29 DIAGNOSIS — R69 Illness, unspecified: Secondary | ICD-10-CM | POA: Diagnosis not present

## 2016-09-30 ENCOUNTER — Telehealth: Payer: Self-pay | Admitting: *Deleted

## 2016-09-30 NOTE — Telephone Encounter (Signed)
Pt says last night felt "fluttering" became SOB with some tightness lasting for 15 mins denied any dizziness/swelling/weight gain/chest pain - says he had coffee just prior to this episode - wife checked BP was 176/102 checked a few mins later came down 150/99 HR remained at 66. Pt says this hasn't happened since LOV 12/2015 - and denies any symptoms today hasn't checked BP this morning. Schedule appt for 6/11 with Dr Domenic Polite (pt is past due for f/u) advised pt to monitor symptoms and BP and if became more frequent or worsened to call back or evaluation at ED - routed to provider as Juluis Rainier

## 2016-10-08 ENCOUNTER — Encounter: Payer: Self-pay | Admitting: *Deleted

## 2016-10-11 ENCOUNTER — Ambulatory Visit (INDEPENDENT_AMBULATORY_CARE_PROVIDER_SITE_OTHER): Payer: Medicare HMO | Admitting: Cardiology

## 2016-10-11 ENCOUNTER — Encounter: Payer: Self-pay | Admitting: Cardiology

## 2016-10-11 VITALS — BP 186/92 | HR 67 | Ht 71.5 in | Wt 223.4 lb

## 2016-10-11 DIAGNOSIS — I10 Essential (primary) hypertension: Secondary | ICD-10-CM

## 2016-10-11 DIAGNOSIS — R69 Illness, unspecified: Secondary | ICD-10-CM | POA: Diagnosis not present

## 2016-10-11 DIAGNOSIS — R002 Palpitations: Secondary | ICD-10-CM

## 2016-10-11 NOTE — Progress Notes (Signed)
Cardiology Office Note  Date: 10/11/2016   ID: Jesus Juarez, DOB 07/30/1950, MRN [id number]  PCP: Rory Percy, MD  Primary Cardiologist: Rozann Lesches, MD   Chief Complaint  Patient presents with  . Cardiac follow-up    History of Present Illness: Jesus Juarez is a 66 y.o. male last seen in August 2017. He presents with his wife for a follow-up visit. He called the office in late May reporting an episode of sudden heart palpitations and chest pain, per telephone note there is mention that it lasted for 15 minutes, however he indicates that it was really only for seconds, certainly less than 30 seconds. He felt mildly lightheaded but did not have syncope. Interestingly, this occurred after having strong coffee in the morning, otherwise he has not been bothered by progressive palpitations or chest pain, remains very active remodeling a house and also playing soccer.  He tells me that his mother has a history of what sounds like PSVT and has required adenosine. It may well be that he has brief episodes of SVT explaining some of the symptoms. I personally reviewed his ECG today which shows normal sinus rhythm with nonspecific ST changes, no pre-excitation.  He remains hesitant to consider any long-term medications.  Past Medical History:  Diagnosis Date  . History of cardiac catheterization    Normal coronaries - approximately 20 years ago  . Hypertension   . Obstructive sleep apnea   . Vitamin D deficiency     History reviewed. No pertinent surgical history.  Current Outpatient Prescriptions  Medication Sig Dispense Refill  . B Complex Vitamins (VITAMIN-B COMPLEX PO) Take by mouth. Occasionally    . cholecalciferol (VITAMIN D) 1000 units tablet Take 1,000 Units by mouth 2 (two) times daily.    . Multiple Vitamin (MULTIVITAMIN) tablet Take 1 tablet by mouth daily.     No current facility-administered medications for this visit.    Allergies:  Penicillins and Sulfur    Social History: The patient  reports that he has never smoked. He has never used smokeless tobacco. He reports that he does not drink alcohol or use drugs.   ROS:  Please see the history of present illness. Otherwise, complete review of systems is positive for none.  All other systems are reviewed and negative.   Physical Exam: VS:  BP (!) 186/92   Pulse 67   Ht 5' 11.5" (1.816 m)   Wt 223 lb 6.4 oz (101.3 kg)   BMI 30.72 kg/m , BMI Body mass index is 30.72 kg/m.  Wt Readings from Last 3 Encounters:  10/11/16 223 lb 6.4 oz (101.3 kg)  03/08/16 224 lb (101.6 kg)  12/30/15 225 lb 3.2 oz (102.2 kg)    General: Patient appears comfortable at rest. HEENT: Conjunctiva and lids normal, oropharynx clear. Neck: Supple, no elevated JVP or carotid bruits, no thyromegaly. Lungs: Clear to auscultation, nonlabored breathing at rest. Cardiac: Regular rate and rhythm, no S3 or significant systolic murmur, no pericardial rub. Abdomen: Soft, nontender, bowel sounds present, no guarding or rebound. Extremities: No pitting edema, distal pulses 2+.  ECG: I personally reviewed the tracing from 10/02/2015 which showed normal sinus rhythm.  Recent Labwork:  March 2017: Cholesterol 183, triglycerides 216, HDL 40, LDL 100, hemoglobin 15.3, platelets 247, BUN 16, creatinine 1.2, potassium 4.6, AST 17, ALT 14   Other Studies Reviewed Today:  Echocardiogram 10/15/2015: Study Conclusions  - Left ventricle: The cavity size was normal. Wall thickness was increased in a  pattern of mild LVH. Systolic function was normal. The estimated ejection fraction was in the range of 60% to 65%. Wall motion was normal; there were no regional wall motion abnormalities. Doppler parameters are consistent with abnormal left ventricular relaxation (grade 1 diastolic dysfunction). - Mitral valve: Calcified annulus. - Left atrium: The atrium was at the upper limits of normal in size. - Right atrium: The  atrium was mildly dilated. Central venous pressure (est): 3 mm Hg. - Atrial septum: No defect or patent foramen ovale was identified. - Tricuspid valve: There was physiologic regurgitation. - Pulmonary arteries: PA peak pressure: 16 mm Hg (S). - Pericardium, extracardiac: There was no pericardial effusion.  Impressions:  - Mild LVH with LVEF 60-65%. Grade 1 diastolic dysfunction with normal estimated LV filling pressure. Mildly calcified mitral annulus. Upper normal left atrial chamber size. Normal estimated PASP 16 mmHg.  Exercise Myoview 10/21/2015:  Blood pressure demonstrated a hypertensive response to exercise.  Horizontal ST segment depression ST segment depression of 2 mm was noted during stress in the II, III and aVF leads. Nonspecific finding in setting of hypertensive response  The study is normal. There are no myocardial perfusion defects.  This is a low risk study.  The left ventricular ejection fraction is normal (55-65%).  Assessment and Plan:  1. Intermittent palpitations and chest pain, could be brief episodes of PSVT although this has not been documented. We will continue observation for now. I have asked him to let me know if he experiences increasing frequency or duration as we may do repeat monitoring. He does not want to take any standing medications at this time. Ischemic workup from June of last year was low risk. LVEF is normal range.  2. History of hypertension, blood pressure elevated today. He is hesitant to take any medications, I have asked him to follow-up with Dr. Nadara Mustard.  Current medicines were reviewed with the patient today.   Orders Placed This Encounter  Procedures  . EKG 12-Lead    Disposition: Follow-up in one year, sooner if needed.  Signed, Satira Sark, MD, [facility] 10/11/2016 4:20 PM    [facility] [place], [place], [place] [address] Phone: [phone]; Fax: [phone]

## 2016-10-11 NOTE — Patient Instructions (Signed)

## 2016-11-08 DIAGNOSIS — G4733 Obstructive sleep apnea (adult) (pediatric): Secondary | ICD-10-CM | POA: Diagnosis not present

## 2016-12-14 DIAGNOSIS — H8102 Meniere's disease, left ear: Secondary | ICD-10-CM | POA: Diagnosis not present

## 2016-12-14 DIAGNOSIS — H6123 Impacted cerumen, bilateral: Secondary | ICD-10-CM | POA: Diagnosis not present

## 2016-12-15 ENCOUNTER — Telehealth: Payer: Self-pay | Admitting: Cardiology

## 2016-12-15 NOTE — Telephone Encounter (Signed)
There is no specific cardiac contraindication for him to use prednisone. He is correct in his concerns about the potential for it to increase blood pressure, although this is usually temporary. Since he has already had elevated blood pressure documented, he will need to keep an eye on this and if necessary see Dr. Nadara Mustard.

## 2016-12-15 NOTE — Telephone Encounter (Signed)
Patient has been predrisone 10mg    Starting with 6 doses For ear inflamation. Would like to know if he is ok to start this medication

## 2016-12-15 NOTE — Telephone Encounter (Signed)
Patient was prescribed prednisone 10 mg due to ear inflammation and is supposed to take 6 tablets today. Patient knows this can raise blood pressure and wants to know if it is ok for him to take.

## 2016-12-15 NOTE — Telephone Encounter (Signed)
Patients wife Mateo Flow notified and verbalized understanding.

## 2016-12-16 ENCOUNTER — Telehealth: Payer: Self-pay

## 2016-12-16 NOTE — Telephone Encounter (Signed)
If he is having acute symptoms with elevated blood pressure, it would be reasonable for him to go to the ER to see if he needs any treatment to bring down his blood pressure. We have talked about hypertension before, and I have recommended to him that he may need to consider taking a regular antihypertensive under the direction of Dr. Nadara Mustard. I know that he has been hesitant to take medications in general. As mentioned yesterday, steroids can further contribute to elevated blood pressure. He still needs to try to get further recommendations from [facility] regarding whether he should stay on the steroids and if a perhaps a medication needs to be started for his blood pressure.

## 2016-12-16 NOTE — Telephone Encounter (Signed)
Patient notified and verbalized understanding. 

## 2016-12-16 NOTE — Telephone Encounter (Signed)
Patients wife contacted office stating that after starting prednisone today (2 with breakfast 1 with lunch) patient started feeling very clammy and felt like BP was elevated. Wife checked BP it was 199/102 HR 93. Advised patient per Dr. Chuck Hint recommendation yesterday to contact PCP. Wife states that was done but they have not heard back and were very concerned with this high BP. BP rechecked while on the phone 198/103 HR 87

## 2016-12-21 DIAGNOSIS — H8102 Meniere's disease, left ear: Secondary | ICD-10-CM | POA: Diagnosis not present

## 2016-12-21 DIAGNOSIS — H903 Sensorineural hearing loss, bilateral: Secondary | ICD-10-CM | POA: Diagnosis not present

## 2016-12-21 DIAGNOSIS — H6122 Impacted cerumen, left ear: Secondary | ICD-10-CM | POA: Diagnosis not present

## 2017-02-08 DIAGNOSIS — R69 Illness, unspecified: Secondary | ICD-10-CM | POA: Diagnosis not present

## 2017-02-28 DIAGNOSIS — G4733 Obstructive sleep apnea (adult) (pediatric): Secondary | ICD-10-CM | POA: Diagnosis not present

## 2017-03-01 DIAGNOSIS — Z6830 Body mass index (BMI) 30.0-30.9, adult: Secondary | ICD-10-CM | POA: Diagnosis not present

## 2017-03-01 DIAGNOSIS — G4730 Sleep apnea, unspecified: Secondary | ICD-10-CM | POA: Diagnosis not present

## 2017-03-01 DIAGNOSIS — R05 Cough: Secondary | ICD-10-CM | POA: Diagnosis not present

## 2017-03-09 DIAGNOSIS — Z6829 Body mass index (BMI) 29.0-29.9, adult: Secondary | ICD-10-CM | POA: Diagnosis not present

## 2017-03-09 DIAGNOSIS — R002 Palpitations: Secondary | ICD-10-CM | POA: Diagnosis not present

## 2017-03-09 DIAGNOSIS — R05 Cough: Secondary | ICD-10-CM | POA: Diagnosis not present

## 2017-04-05 DIAGNOSIS — H02834 Dermatochalasis of left upper eyelid: Secondary | ICD-10-CM | POA: Diagnosis not present

## 2017-04-05 DIAGNOSIS — G43909 Migraine, unspecified, not intractable, without status migrainosus: Secondary | ICD-10-CM | POA: Diagnosis not present

## 2017-04-05 DIAGNOSIS — H02831 Dermatochalasis of right upper eyelid: Secondary | ICD-10-CM | POA: Diagnosis not present

## 2017-04-05 DIAGNOSIS — H2513 Age-related nuclear cataract, bilateral: Secondary | ICD-10-CM | POA: Diagnosis not present

## 2017-04-05 DIAGNOSIS — H40033 Anatomical narrow angle, bilateral: Secondary | ICD-10-CM | POA: Diagnosis not present

## 2017-04-19 DIAGNOSIS — H6123 Impacted cerumen, bilateral: Secondary | ICD-10-CM | POA: Diagnosis not present

## 2017-04-19 DIAGNOSIS — H6982 Other specified disorders of Eustachian tube, left ear: Secondary | ICD-10-CM | POA: Diagnosis not present

## 2017-04-19 DIAGNOSIS — H8102 Meniere's disease, left ear: Secondary | ICD-10-CM | POA: Diagnosis not present

## 2017-05-24 DIAGNOSIS — G4733 Obstructive sleep apnea (adult) (pediatric): Secondary | ICD-10-CM | POA: Diagnosis not present

## 2017-05-26 DIAGNOSIS — L84 Corns and callosities: Secondary | ICD-10-CM | POA: Diagnosis not present

## 2017-05-26 DIAGNOSIS — L570 Actinic keratosis: Secondary | ICD-10-CM | POA: Diagnosis not present

## 2017-06-04 DIAGNOSIS — Z6831 Body mass index (BMI) 31.0-31.9, adult: Secondary | ICD-10-CM | POA: Diagnosis not present

## 2017-06-04 DIAGNOSIS — L03115 Cellulitis of right lower limb: Secondary | ICD-10-CM | POA: Diagnosis not present

## 2017-07-06 DIAGNOSIS — G4733 Obstructive sleep apnea (adult) (pediatric): Secondary | ICD-10-CM | POA: Diagnosis not present

## 2017-07-20 DIAGNOSIS — R69 Illness, unspecified: Secondary | ICD-10-CM | POA: Diagnosis not present

## 2017-08-06 DIAGNOSIS — G4733 Obstructive sleep apnea (adult) (pediatric): Secondary | ICD-10-CM | POA: Diagnosis not present

## 2017-08-09 DIAGNOSIS — H6122 Impacted cerumen, left ear: Secondary | ICD-10-CM | POA: Diagnosis not present

## 2017-08-16 DIAGNOSIS — R69 Illness, unspecified: Secondary | ICD-10-CM | POA: Diagnosis not present

## 2017-08-31 DIAGNOSIS — Z6830 Body mass index (BMI) 30.0-30.9, adult: Secondary | ICD-10-CM | POA: Diagnosis not present

## 2017-08-31 DIAGNOSIS — J069 Acute upper respiratory infection, unspecified: Secondary | ICD-10-CM | POA: Diagnosis not present

## 2017-08-31 DIAGNOSIS — R05 Cough: Secondary | ICD-10-CM | POA: Diagnosis not present

## 2017-09-05 DIAGNOSIS — G4733 Obstructive sleep apnea (adult) (pediatric): Secondary | ICD-10-CM | POA: Diagnosis not present

## 2017-09-09 DIAGNOSIS — K219 Gastro-esophageal reflux disease without esophagitis: Secondary | ICD-10-CM | POA: Diagnosis not present

## 2017-09-09 DIAGNOSIS — I10 Essential (primary) hypertension: Secondary | ICD-10-CM | POA: Diagnosis not present

## 2017-09-09 DIAGNOSIS — N400 Enlarged prostate without lower urinary tract symptoms: Secondary | ICD-10-CM | POA: Diagnosis not present

## 2017-09-09 DIAGNOSIS — G4730 Sleep apnea, unspecified: Secondary | ICD-10-CM | POA: Diagnosis not present

## 2017-09-09 DIAGNOSIS — R5383 Other fatigue: Secondary | ICD-10-CM | POA: Diagnosis not present

## 2017-09-09 DIAGNOSIS — E559 Vitamin D deficiency, unspecified: Secondary | ICD-10-CM | POA: Diagnosis not present

## 2017-09-13 DIAGNOSIS — G4730 Sleep apnea, unspecified: Secondary | ICD-10-CM | POA: Diagnosis not present

## 2017-09-13 DIAGNOSIS — Z0000 Encounter for general adult medical examination without abnormal findings: Secondary | ICD-10-CM | POA: Diagnosis not present

## 2017-09-13 DIAGNOSIS — Z6830 Body mass index (BMI) 30.0-30.9, adult: Secondary | ICD-10-CM | POA: Diagnosis not present

## 2017-09-13 DIAGNOSIS — E559 Vitamin D deficiency, unspecified: Secondary | ICD-10-CM | POA: Diagnosis not present

## 2017-10-06 DIAGNOSIS — G4733 Obstructive sleep apnea (adult) (pediatric): Secondary | ICD-10-CM | POA: Diagnosis not present

## 2017-10-24 ENCOUNTER — Ambulatory Visit: Payer: Medicare HMO | Admitting: Cardiology

## 2017-11-05 DIAGNOSIS — G4733 Obstructive sleep apnea (adult) (pediatric): Secondary | ICD-10-CM | POA: Diagnosis not present

## 2017-11-07 DIAGNOSIS — G4733 Obstructive sleep apnea (adult) (pediatric): Secondary | ICD-10-CM | POA: Diagnosis not present

## 2017-11-15 DIAGNOSIS — R69 Illness, unspecified: Secondary | ICD-10-CM | POA: Diagnosis not present

## 2017-11-28 NOTE — Progress Notes (Signed)
Cardiology Office Note  Date: 11/29/2017   ID: Jesus Juarez, DOB 11/01/1950, MRN [id number]  PCP: Rory Percy, MD  Primary Cardiologist: Rozann Lesches, MD   Chief Complaint  Patient presents with  . Cardiac follow-up    History of Present Illness: Jesus Juarez is a 67 y.o. male last seen in June 2018.  He is here for a routine visit.  He tells me that he has been doing well, but reports no angina symptoms or unusual shortness of breath with typical activities.  He is in the process of remodeling home in [place] where he and his wife plan to retire.  He is doing a lot of the work himself.  He does report occasional palpitations, no lightheadedness or syncope.  I personally reviewed his ECG today which shows a sinus rhythm with R' in lead V1.  We went over his medications.  He takes aspirin occasionally for headache, not on a standing basis.  He continues to prefer holding off any specific medical therapy for treatment of hypertension.  His PCP is Dr. Nadara Mustard.  I have talked with him about exercise and a low-salt diet.  Past Medical History:  Diagnosis Date  . History of cardiac catheterization    Normal coronaries - approximately 20 years ago  . Hypertension   . Obstructive sleep apnea   . Vitamin D deficiency     History reviewed. No pertinent surgical history.  Current Outpatient Medications  Medication Sig Dispense Refill  . aspirin 81 MG tablet Take 81 mg by mouth. Occasionally    . B Complex Vitamins (VITAMIN-B COMPLEX PO) Take by mouth. Occasionally    . cholecalciferol (VITAMIN D) 1000 units tablet Take 1,000 Units by mouth 2 (two) times daily.    . Multiple Vitamin (MULTIVITAMIN) tablet Take 1 tablet by mouth daily.     No current facility-administered medications for this visit.    Allergies:  Penicillins and Sulfur   Social History: The patient  reports that he has never smoked. He has never used smokeless tobacco. He reports that he does not drink  alcohol or use drugs.   ROS:  Please see the history of present illness. Otherwise, complete review of systems is positive for none.  All other systems are reviewed and negative.   Physical Exam: VS:  BP (!) 175/90   Pulse 69   Ht 5' 11.5" (1.816 m)   Wt 224 lb 9.6 oz (101.9 kg)   SpO2 95%   BMI 30.89 kg/m , BMI Body mass index is 30.89 kg/m.  Wt Readings from Last 3 Encounters:  11/29/17 224 lb 9.6 oz (101.9 kg)  10/11/16 223 lb 6.4 oz (101.3 kg)  03/08/16 224 lb (101.6 kg)    General: Patient appears comfortable at rest. HEENT: Conjunctiva and lids normal, oropharynx clear. Neck: Supple, no elevated JVP or carotid bruits, no thyromegaly. Lungs: Clear to auscultation, nonlabored breathing at rest. Cardiac: Regular rate and rhythm, no S3 or significant systolic murmur, no pericardial rub. Abdomen: Soft, nontender, bowel sounds present. Extremities: No pitting edema, distal pulses 2+.  ECG: I personally reviewed the tracing from 10/11/2016 which showed sinus rhythm with nonspecific ST changes.  Recent Labwork:  March 2017: Cholesterol 183, triglycerides 216, HDL 40, LDL 100, hemoglobin 15.3, platelets 247, BUN 16, creatinine 1.2, potassium 4.6, AST 17, ALT 14   Other Studies Reviewed Today:  Echocardiogram 10/15/2015: Study Conclusions  - Left ventricle: The cavity size was normal. Wall thickness was increased in a  pattern of mild LVH. Systolic function was normal. The estimated ejection fraction was in the range of 60% to 65%. Wall motion was normal; there were no regional wall motion abnormalities. Doppler parameters are consistent with abnormal left ventricular relaxation (grade 1 diastolic dysfunction). - Mitral valve: Calcified annulus. - Left atrium: The atrium was at the upper limits of normal in size. - Right atrium: The atrium was mildly dilated. Central venous pressure (est): 3 mm Hg. - Atrial septum: No defect or patent foramen ovale was  identified. - Tricuspid valve: There was physiologic regurgitation. - Pulmonary arteries: PA peak pressure: 16 mm Hg (S). - Pericardium, extracardiac: There was no pericardial effusion.  Impressions:  - Mild LVH with LVEF 60-65%. Grade 1 diastolic dysfunction with normal estimated LV filling pressure. Mildly calcified mitral annulus. Upper normal left atrial chamber size. Normal estimated PASP 16 mmHg.  Exercise Myoview 10/21/2015:  Blood pressure demonstrated a hypertensive response to exercise.  Horizontal ST segment depression ST segment depression of 2 mm was noted during stress in the II, III and aVF leads. Nonspecific finding in setting of hypertensive response  The study is normal. There are no myocardial perfusion defects.  This is a low risk study.  The left ventricular ejection fraction is normal (55-65%).  Assessment and Plan:  1.  History of brief, intermittent palpitations.  He does not report any progression, no dizziness or syncope.  He has not had any recent chest pain and reports good exercise tolerance.  I reviewed his ECG which is stable.  We will continue with observation at this point.  If symptoms worsen, follow-up cardiac monitoring can be pursued.  2.  Elevated blood pressure.  We have discussed hypertension, treatment options.  He has preferred to hold off on medical therapy.  I encouraged him to maintain a low-salt diet, keep follow-up with PCP.  Current medicines were reviewed with the patient today.   Orders Placed This Encounter  Procedures  . EKG 12-Lead    Disposition: Follow-up in 1 year.  Signed, Satira Sark, MD, [facility] 11/29/2017 2:33 PM    [facility] [place], [place], [place] [address] Phone: [phone]; Fax: [phone]

## 2017-11-29 ENCOUNTER — Ambulatory Visit (INDEPENDENT_AMBULATORY_CARE_PROVIDER_SITE_OTHER): Payer: Medicare HMO | Admitting: Cardiology

## 2017-11-29 ENCOUNTER — Encounter: Payer: Self-pay | Admitting: Cardiology

## 2017-11-29 VITALS — BP 175/90 | HR 69 | Ht 71.5 in | Wt 224.6 lb

## 2017-11-29 DIAGNOSIS — I10 Essential (primary) hypertension: Secondary | ICD-10-CM

## 2017-11-29 DIAGNOSIS — R002 Palpitations: Secondary | ICD-10-CM | POA: Diagnosis not present

## 2017-11-29 NOTE — Patient Instructions (Signed)

## 2017-12-06 DIAGNOSIS — G4733 Obstructive sleep apnea (adult) (pediatric): Secondary | ICD-10-CM | POA: Diagnosis not present

## 2018-01-06 DIAGNOSIS — G4733 Obstructive sleep apnea (adult) (pediatric): Secondary | ICD-10-CM | POA: Diagnosis not present

## 2018-02-05 DIAGNOSIS — G4733 Obstructive sleep apnea (adult) (pediatric): Secondary | ICD-10-CM | POA: Diagnosis not present

## 2018-02-07 DIAGNOSIS — H8102 Meniere's disease, left ear: Secondary | ICD-10-CM | POA: Diagnosis not present

## 2018-02-07 DIAGNOSIS — H903 Sensorineural hearing loss, bilateral: Secondary | ICD-10-CM | POA: Diagnosis not present

## 2018-02-07 DIAGNOSIS — H6123 Impacted cerumen, bilateral: Secondary | ICD-10-CM | POA: Diagnosis not present

## 2018-02-28 DIAGNOSIS — G4733 Obstructive sleep apnea (adult) (pediatric): Secondary | ICD-10-CM | POA: Diagnosis not present

## 2018-03-08 DIAGNOSIS — G4733 Obstructive sleep apnea (adult) (pediatric): Secondary | ICD-10-CM | POA: Diagnosis not present

## 2018-03-21 DIAGNOSIS — R69 Illness, unspecified: Secondary | ICD-10-CM | POA: Diagnosis not present

## 2018-04-07 DIAGNOSIS — G4733 Obstructive sleep apnea (adult) (pediatric): Secondary | ICD-10-CM | POA: Diagnosis not present

## 2018-05-08 DIAGNOSIS — G4733 Obstructive sleep apnea (adult) (pediatric): Secondary | ICD-10-CM | POA: Diagnosis not present

## 2018-05-25 DIAGNOSIS — L819 Disorder of pigmentation, unspecified: Secondary | ICD-10-CM | POA: Diagnosis not present

## 2018-05-25 DIAGNOSIS — L570 Actinic keratosis: Secondary | ICD-10-CM | POA: Diagnosis not present

## 2018-05-25 DIAGNOSIS — L84 Corns and callosities: Secondary | ICD-10-CM | POA: Diagnosis not present

## 2018-05-25 DIAGNOSIS — L821 Other seborrheic keratosis: Secondary | ICD-10-CM | POA: Diagnosis not present

## 2018-05-25 DIAGNOSIS — D229 Melanocytic nevi, unspecified: Secondary | ICD-10-CM | POA: Diagnosis not present

## 2018-05-25 DIAGNOSIS — L814 Other melanin hyperpigmentation: Secondary | ICD-10-CM | POA: Diagnosis not present

## 2018-06-12 ENCOUNTER — Telehealth: Payer: Self-pay | Admitting: *Deleted

## 2018-06-12 ENCOUNTER — Emergency Department (HOSPITAL_COMMUNITY)
Admission: EM | Admit: 2018-06-12 | Discharge: 2018-06-12 | Disposition: A | Discharge status: Home / Self Care | Payer: Medicare HMO | Attending: Emergency Medicine | Admitting: Emergency Medicine

## 2018-06-12 ENCOUNTER — Emergency Department (HOSPITAL_COMMUNITY): Payer: Medicare HMO

## 2018-06-12 DIAGNOSIS — Z79899 Other long term (current) drug therapy: Secondary | ICD-10-CM | POA: Insufficient documentation

## 2018-06-12 DIAGNOSIS — I10 Essential (primary) hypertension: Secondary | ICD-10-CM

## 2018-06-12 DIAGNOSIS — R202 Paresthesia of skin: Secondary | ICD-10-CM | POA: Insufficient documentation

## 2018-06-12 DIAGNOSIS — R200 Anesthesia of skin: Secondary | ICD-10-CM | POA: Diagnosis not present

## 2018-06-12 DIAGNOSIS — Z8679 Personal history of other diseases of the circulatory system: Secondary | ICD-10-CM | POA: Insufficient documentation

## 2018-06-12 LAB — COMPREHENSIVE METABOLIC PANEL
ALK PHOS: 66 U/L (ref 38–126)
ALT: 26 U/L (ref 0–44)
AST: 26 U/L (ref 15–41)
Albumin: 4 g/dL (ref 3.5–5.0)
Anion gap: 10 (ref 5–15)
BUN: 15 mg/dL (ref 8–23)
CALCIUM: 9.1 mg/dL (ref 8.9–10.3)
CO2: 25 mmol/L (ref 22–32)
Chloride: 104 mmol/L (ref 98–111)
Creatinine, Ser: 1.24 mg/dL (ref 0.61–1.24)
GFR calc Af Amer: >60 mL/min (ref >60)
GFR calc non Af Amer: 60 mL/min — ABNORMAL LOW (ref >60)
Glucose, Bld: 114 mg/dL — ABNORMAL HIGH (ref 70–99)
Potassium: 4.3 mmol/L (ref 3.5–5.1)
Sodium: 139 mmol/L (ref 135–145)
Total Bilirubin: 0.5 mg/dL (ref 0.3–1.2)
Total Protein: 7.2 g/dL (ref 6.5–8.1)

## 2018-06-12 LAB — DIFFERENTIAL
Abs Immature Granulocytes: 0.04 10*3/uL (ref 0.00–0.07)
Basophils Absolute: 0 10*3/uL (ref 0.0–0.1)
Basophils Relative: 0 %
Eosinophils Absolute: 0.1 10*3/uL (ref 0.0–0.5)
Eosinophils Relative: 1 %
Immature Granulocytes: 1 %
Lymphocytes Relative: 19 %
Lymphs Abs: 1.7 10*3/uL (ref 0.7–4.0)
Monocytes Absolute: 0.7 10*3/uL (ref 0.1–1.0)
Monocytes Relative: 8 %
Neutro Abs: 6.1 10*3/uL (ref 1.7–7.7)
Neutrophils Relative %: 71 %

## 2018-06-12 LAB — PROTIME-INR
INR: 0.99
Prothrombin Time: 13 seconds (ref 11.4–15.2)

## 2018-06-12 LAB — CBC
HEMATOCRIT: 46.2 % (ref 39.0–52.0)
Hemoglobin: 15.2 g/dL (ref 13.0–17.0)
MCH: 29 pg (ref 26.0–34.0)
MCHC: 32.9 g/dL (ref 30.0–36.0)
MCV: 88 fL (ref 80.0–100.0)
NRBC: 0 % (ref 0.0–0.2)
Platelets: 237 10*3/uL (ref 150–400)
RBC: 5.25 MIL/uL (ref 4.22–5.81)
RDW: 12.8 % (ref 11.5–15.5)
WBC: 8.6 10*3/uL (ref 4.0–10.5)

## 2018-06-12 LAB — APTT: aPTT: 30 seconds (ref 24–36)

## 2018-06-12 MED ORDER — LABETALOL HCL 5 MG/ML IV SOLN
10.0000 mg | Freq: Once | INTRAVENOUS | Status: AC
  Administered 2018-06-12: 10 mg via INTRAVENOUS
  Filled 2018-06-12: qty 4

## 2018-06-12 MED ORDER — SODIUM CHLORIDE 0.9% FLUSH
3.0000 mL | Freq: Once | INTRAVENOUS | Status: AC
  Administered 2018-06-12: 3 mL via INTRAVENOUS

## 2018-06-12 MED ORDER — AMLODIPINE BESYLATE 5 MG PO TABS: 5.0000 mg | ORAL_TABLET | Freq: Every day | ORAL | 0 refills | Status: DC

## 2018-06-12 NOTE — Discharge Instructions (Addendum)
Get rechecked immediately if you develop any new or concerning symptoms.

## 2018-06-12 NOTE — ED Provider Notes (Signed)
[facility] EMERGENCY DEPARTMENT Provider Note   CSN: [id number] Arrival date & time: 06/12/18  1444     History   Chief Complaint Chief Complaint  Patient presents with  . Numbness    HPI EATHEN BUDREAU is a 68 y.o. male.  The history is provided by the patient. No language interpreter was used.   JADARIOUS DOBBINS is a 68 y.o. male who presents to the Emergency Department complaining of numbness. He reports intermittent numbness to the right arm and face that began yesterday. Episodes of numbness are mostly located in the arm and last about 30 seconds at a time. He reports altered sensation throughout the right upper extremity and at times it did feel heavy. The heaviness and altered sensation lasted longer than thirty seconds but is now resolved.  He denies any additional symptoms. No headache, nausea, vomiting, fever, cough, shortness of breath, weakness. He denies any medical problems and takes no medications. No prior similar symptoms. Past Medical History:  Diagnosis Date  . History of cardiac catheterization    Normal coronaries - approximately 20 years ago  . Hypertension   . Obstructive sleep apnea   . Vitamin D deficiency     Patient Active Problem List   Diagnosis Date Noted  . OSA (obstructive sleep apnea) 03/08/2016    No past surgical history on file.      Home Medications    Prior to Admission medications   Medication Sig Start Date End Date Taking? Authorizing Provider  B Complex Vitamins (VITAMIN-B COMPLEX PO) Take 1 tablet by mouth daily.    Yes [provider]  cholecalciferol (VITAMIN D) 1000 units tablet Take 1,000 Units by mouth 2 (two) times daily.   Yes [provider]  Multiple Vitamin (MULTIVITAMIN) tablet Take 1 tablet by mouth daily.   Yes [provider]  amLODipine (NORVASC) 5 MG tablet Take 1 tablet (5 mg total) by mouth daily. 06/12/18   Quintella Reichert, MD    Family History Family History    Problem Relation Age of Onset  . Supraventricular tachycardia Mother   . Hypertension Mother     Social History Social History   Tobacco Use  . Smoking status: Never Smoker  . Smokeless tobacco: Never Used  Substance Use Topics  . Alcohol use: No    Alcohol/week: 0.0 standard drinks  . Drug use: No     Allergies   Penicillins and Sulfur   Review of Systems Review of Systems  All other systems reviewed and are negative.    Physical Exam Updated Vital Signs BP (!) 175/88 (BP Location: Right Arm)   Pulse 72   Temp 98.2 F (36.8 C) (Oral)   Resp 16   Ht 5' 11.5" (1.816 m)   Wt 99.8 kg   SpO2 100%   BMI 30.26 kg/m   Physical Exam Vitals signs and nursing note reviewed.  Constitutional:      Appearance: He is well-developed.  HENT:     Head: Normocephalic and atraumatic.  Cardiovascular:     Rate and Rhythm: Normal rate and regular rhythm.     Heart sounds: No murmur.  Pulmonary:     Effort: Pulmonary effort is normal. No respiratory distress.     Breath sounds: Normal breath sounds.  Abdominal:     Palpations: Abdomen is soft.     Tenderness: There is no abdominal tenderness. There is no guarding or rebound.  Musculoskeletal:  General: No tenderness.  Skin:    General: Skin is warm and dry.  Neurological:     Mental Status: He is alert and oriented to person, place, and time.     Comments: Visual fields grossly intact. No facial asymmetry. No pronator drift. Five out of five strength in all four extremities with sensation to light touch intact in all four extremities  Psychiatric:        Behavior: Behavior normal.      ED Treatments / Results  Labs (all labs ordered are listed, but only abnormal results are displayed) Labs Reviewed  COMPREHENSIVE METABOLIC PANEL - Abnormal; Notable for the following components:      Result Value   Glucose, Bld 114 (*)    GFR calc non Af Amer 60 (*)    All other components within normal limits   PROTIME-INR  APTT  CBC  DIFFERENTIAL  CBG MONITORING, ED    EKG EKG Interpretation  Date/Time:  Monday June 12 2018 14:58:36 EST Ventricular Rate:  73 PR Interval:  144 QRS Duration: 94 QT Interval:  414 QTC Calculation: 456 R Axis:   54 Text Interpretation:  Normal sinus rhythm Normal ECG Confirmed by Quintella Reichert [phone]) on 06/12/2018 4:15:53 PM   Radiology Ct Head Wo Contrast  Result Date: 06/12/2018 CLINICAL DATA:  Numbness in right arm and right-sided face, 3 times in the last 24 hours. EXAM: CT HEAD WITHOUT CONTRAST TECHNIQUE: Contiguous axial images were obtained from the base of the skull through the vertex without intravenous contrast. COMPARISON:  None. FINDINGS: Brain: No evidence of acute infarction, hemorrhage, hydrocephalus, extra-axial collection or mass lesion/mass effect. Vascular: No hyperdense vessel or unexpected calcification. Skull: Normal. Negative for fracture or focal lesion. Sinuses/Orbits: No acute finding. Other: None. IMPRESSION: Normal brain. Electronically Signed   By: Dorise Bullion III M.D   On: 06/12/2018 16:57   Mr Brain Wo Contrast  Result Date: 06/12/2018 CLINICAL DATA:  Initial evaluation for intermittent right-sided numbness. EXAM: MRI HEAD WITHOUT CONTRAST TECHNIQUE: Multiplanar, multiecho pulse sequences of the brain and surrounding structures were obtained without intravenous contrast. COMPARISON:  Prior CT from earlier the same day. FINDINGS: Brain: Cerebral volume within normal limits for age. Minimal T2/FLAIR hyperintensity within the periventricular white matter, most like related chronic microvascular ischemic change, also felt to be within normal limits for age. No abnormal foci of restricted diffusion to suggest acute or subacute ischemia. Gray-white matter differentiation maintained. No encephalomalacia to suggest chronic infarction. No foci of susceptibility artifact to suggest acute or chronic intracranial hemorrhage. No mass  lesion, midline shift or mass effect. No hydrocephalus. No extra-axial fluid collection. Pituitary gland suprasellar region normal. Vascular: Major intracranial vascular flow voids maintained. Skull and upper cervical spine: Craniocervical junction within normal limits. Upper cervical spine normal. 2 cm well-circumscribed T1/T2 hyperintense lesion at the right parietal calvarium noted, indeterminate, but most likely benign and of doubtful significance. Visualized osseous structures otherwise unremarkable. Scalp soft tissues unremarkable. Sinuses/Orbits: Globes and orbital soft tissues within normal limits. Mastoid air cells are largely clear. Left mastoid effusion noted. Inner ear structures grossly normal. Other: None. IMPRESSION: 1. Negative brain MRI. No acute intracranial abnormality identified. 2. Left mastoid effusion. Electronically Signed   By: Jeannine Boga M.D.   On: 06/12/2018 20:50    Procedures Procedures (including critical care time)  Medications Ordered in ED Medications  sodium chloride flush (NS) 0.9 % injection 3 mL (3 mLs Intravenous Given 06/12/18 1617)  labetalol (NORMODYNE,TRANDATE) injection 10 mg (10  mg Intravenous Given 06/12/18 1759)     Initial Impression / Assessment and Plan / ED Course  I have reviewed the triage vital signs and the nursing notes.  Pertinent labs & imaging results that were available during my care of the patient were reviewed by me and considered in my medical decision making (see chart for details).     Patient here for evaluation of altered sensation to light touch throughout the right arm as well as right face. He is neurologically intact on examination. CT head and MRI are negative for CVA. He is noted to be hypertensive in the emergency department, not currently on any medications for hypertension. Question some element of hypertension contributing to his symptoms. Will start him on amlodipine, baby aspirin with close PCP follow-up as  well as return precautions.  Final Clinical Impressions(s) / ED Diagnoses   Final diagnoses:  Paresthesia  Essential hypertension    ED Discharge Orders         Ordered    amLODipine (NORVASC) 5 MG tablet  Daily     06/12/18 2141           Quintella Reichert, MD 06/12/18 2349

## 2018-06-12 NOTE — ED Notes (Signed)
Patient transported to MRI 

## 2018-06-12 NOTE — ED Triage Notes (Signed)
Pt in c/o episodes of numbness to his right arm and right side of his face that have occurred three times in the last 24 hours, episodes last approx 30 seconds to minutes but then resolve, denies any symptoms at this time

## 2018-06-12 NOTE — Telephone Encounter (Signed)
Patient calling with c/o numbness / tingling in his left arm today.  Did this several times yesterday.  Today he does notice a funny feeling in cheek / tingly.  Warm feeling. No chest pain or SOB.  Does c/o feeling weakness, just not feeling right.    Suggested he be evaluated by urgent care or pmd today.  Patient states that he is already out in [place] area - suggest he go to [facility] ED or urgent care today to be evaluated.  Patient verbalized understanding.

## 2018-06-12 NOTE — ED Notes (Signed)
Patient verbalizes understanding of discharge instructions. Opportunity for questioning and answers were provided. Armband removed by staff, pt discharged from ED ambulatory.   

## 2018-06-12 NOTE — ED Notes (Signed)
Patient transported to CT 

## 2018-06-13 ENCOUNTER — Telehealth: Payer: Self-pay | Admitting: Cardiology

## 2018-06-13 NOTE — Telephone Encounter (Signed)
Pt wife says they have not started amlodipine yet wanted to make sure that Dr Domenic Polite was ok with this and ASA 81 mg daily - pt BP this morning is 174/100 - also wanted to schedule appt (07/07/18) - pt says they will go this morning to pick up amlodipine

## 2018-06-13 NOTE — Telephone Encounter (Signed)
Yes, I think that is a reasonable option.  Go ahead and start the medication as prescribed and then keep an eye on blood pressure.

## 2018-06-13 NOTE — Telephone Encounter (Signed)
Pt voiced understanding

## 2018-06-13 NOTE — Telephone Encounter (Signed)
Patient's wife called in regarding trip to ER at [facility] on 06-12-18. They are wanting to start him on amlodipine, baby aspirin . Patient wants to know if Dr. Domenic Polite is ok with this medication . States that his BP is still running high.

## 2018-06-14 NOTE — Telephone Encounter (Addendum)
Started amlodipine 5 mg yesterday. BP 196/98 when rechecked while on phone. BP monitor is 67 years old, is correct size and is using same arm when checked. No c/o h/a, dizziness, chest pain, sob, blurred vision, swelling,numbnes or tingling. Reports not using CPAP last night. Advised that it can take several days to see decrease in BP from new medication (amlodipine). Advised to continue monitoring BP and symptoms. Advised to take BP one hour after BP meds, keep feet flat on the floor, arm heart level, waiting 5-10 minutes before checking it after sitting down. Advise if BP continues to increase, or he develops stroke symptoms, to go to the ED for an evaluation. Verbalized understanding of plan.

## 2018-06-14 NOTE — Telephone Encounter (Signed)
Wife calling to state that her husband's BP is still remaining high  Today at 945 193/113  Took medication at 8am

## 2018-06-20 ENCOUNTER — Telehealth: Payer: Self-pay | Admitting: Cardiology

## 2018-06-20 DIAGNOSIS — H02834 Dermatochalasis of left upper eyelid: Secondary | ICD-10-CM | POA: Diagnosis not present

## 2018-06-20 DIAGNOSIS — H40033 Anatomical narrow angle, bilateral: Secondary | ICD-10-CM | POA: Diagnosis not present

## 2018-06-20 DIAGNOSIS — H2513 Age-related nuclear cataract, bilateral: Secondary | ICD-10-CM | POA: Diagnosis not present

## 2018-06-20 DIAGNOSIS — G43109 Migraine with aura, not intractable, without status migrainosus: Secondary | ICD-10-CM | POA: Diagnosis not present

## 2018-06-20 DIAGNOSIS — H02831 Dermatochalasis of right upper eyelid: Secondary | ICD-10-CM | POA: Diagnosis not present

## 2018-06-20 MED ORDER — AMLODIPINE BESYLATE 5 MG PO TABS: 7.5000 mg | ORAL_TABLET | Freq: Every day | ORAL | 1 refills | Status: DC

## 2018-06-20 NOTE — Telephone Encounter (Signed)
Patient said he contacted office about elevated BP's (see in original message) that continue and also said that he felt a tingling feeling in his left hand and says his face felt warm. Reports using his CPAP nightly. Reports taking all doses of medications including amlodipine 5 mg.  No c/o dizziness, chest pain, sob, blurred vision, or headache.  Most recent BP is 169/95 & HR 77. Advised patient that he should contact his PCP about his tingling in left hand and warm feeling in face (PCP Rory Percy). Advised that message would be sent to his provider for recommendations. Upcoming appointment with Domenic Polite on 07/07/2018.

## 2018-06-20 NOTE — Telephone Encounter (Signed)
Pt c/o BP issue:  1. What are your last 5 BP readings? 175/97 this am , 206/111, 178/99, 184/98   2. Are you having any other symptoms (ex. Dizziness, headache, blurred vision, passed out)? Fatigue, side of his face feels warm   3. What is your medication issue?  States that he has been on a new medication for approximately a week now.

## 2018-06-20 NOTE — Telephone Encounter (Signed)
Patient informed and verbalized understanding of plan. 

## 2018-06-20 NOTE — Telephone Encounter (Signed)
Would consider increasing Norvasc to 7.5 mg daily and continue to track blood pressure.  May ultimately need to increase to 10 mg daily depending on blood pressure control.  Agree, would keep Dr. Nadara Mustard aware of his recurring symptoms, although recent head CT and MRI were negative.

## 2018-06-29 ENCOUNTER — Telehealth: Payer: Self-pay | Admitting: Cardiology

## 2018-06-29 NOTE — Telephone Encounter (Signed)
Paged by pt wife who states that the patient has a BP of 186/107>188/104>197/106 with numbness and tingling periodically. He was seen in the ED 06/12/2018 with similar symptoms in which his BP was noted to be elevated. Complete workup was done for CVA/TIA which was found to be negative. He was discharged on Amlodipine 5mg  which was then titrated through multiple phone encounters by Dr. Chuck Hint office for persistent BP elevations. He has been taking Amlodipine 7.5mg . he has not taken it yet this evening. I instructed him to take a total of 10mg  now and recheck BP in 1 hour. If BP is not lower than 150-160/60-80 and/or symptoms persist, he is to come to the ED for further evaluation. If responsive, will need to contact Dr. Chuck Hint office in the AM for an appointment. Further ED precautions given and patient and his wife are in agreement with the plan.   Kathyrn Drown NP-C [place] Pager: [phone]

## 2018-06-30 ENCOUNTER — Other Ambulatory Visit: Payer: Self-pay | Admitting: Pharmacist

## 2018-06-30 MED ORDER — AMLODIPINE BESYLATE 10 MG PO TABS: 10.0000 mg | ORAL_TABLET | Freq: Every day | ORAL | 0 refills | Status: DC

## 2018-06-30 NOTE — Telephone Encounter (Signed)
Called spoke with patient who agreed with this plan. Medication 10 mg was already called into the pharmacy, patient made aware, appointment scheduled for Monday.

## 2018-06-30 NOTE — Telephone Encounter (Signed)
Rx sent to [facility] [employer]. All questions answered.   Patient will monitor BP twice daily until appt on 3/2 and bring records to [place].  Additional recommendations given:  1. Limit caffeine 2. Limit sodium 3. Avoid nicotine 4. Stay hydrated

## 2018-06-30 NOTE — Telephone Encounter (Addendum)
Pt called to report that his BP is still elevated and he is still having tingling in his left arm and back... he denies headache, dizziness, chest pain, and SOB... His first dose of the increased dose of Amlodipine 10mg  was last night and he has not had it today... I advised him to go ahead and take it.Marland Kitchen Kathyrn Drown had increased it when the pt called in and she was on call)   His BP this morning was 174/103, 172/197, 148/94, 164/94...Marland KitchenMarland Kitchen HR 70-80  Pt says he is using an electronic cuff and it has new batteries..    I advised pt to continue to monitor and made him an appt Monday 07/03/18 with Cecilie Kicks NP.Marland Kitchen  Pt has moved to [facility] in the last 2 weeks and he is not wanting to travel to [place] anymore to see Dr. Domenic Polite..  Pt has spoken to Dr. Myles Gip office and  Dr. Domenic Polite has noted BP problems and had planned to see the pt 07/07/18... he has also noted the pt has had the tingling and he recommended he talk with his PMD Dr. Nadara Mustard... pt had recent negative CT and MRI..  I urged him to relax and give the med a chance and to be sure that he is resting for a good 30 minutes prior to checking it... and not to keep checking it in the same arm over and over.Marland Kitchen pts wife spoke with me and she was very anxious about the pts BP.. I advised her to try and relax otherwise his BP may not come down and if worried over the weekend to go to the ER.. They both verbalized understanding and agreed.    Will talk with our Pharmacist if the pt should make any other med changes prior to [facility] app.

## 2018-06-30 NOTE — Telephone Encounter (Addendum)
Patient's wife called [place] Office requesting an appointment today per instructions of telephone call to [place] last evening. BP readings 174.103, 172/197, 164/94, 148/94 (all readings since this AM) States that he continues to have some tingling. Wife states that he did increase the Amlodipine per instructions from NP Kathyrn Drown. Will forward message to Triage for further instructions.

## 2018-06-30 NOTE — Telephone Encounter (Signed)
It looks like the one-time dose increase of amlodipine 10 mg was moderately effective.  Is there any way that we could have this prescription sent to his pharmacy for amlodipine 10 mg daily until he is evaluated in the office?  Thank you

## 2018-07-02 NOTE — Progress Notes (Signed)
Cardiology Office Note   Date:  07/03/2018   ID:  Jesus Juarez, DOB [birth year], MRN [id number]  PCP:  Rory Percy, MD  Cardiologist:    Previously Dr. McDowell/ now Dr. Percival Spanish.   Chief Complaint  Patient presents with  . Hypertension      History of Present Illness: Jesus Juarez is a 68 y.o. male who presents for hypertension.  Recently seen in ER with numbness to Rt arm and face.     Hx of normal cath 20 yrs ago. Other hx HTN OSA Vit D def. Last nuc 2017 with low risk study. palpitations in 10/2017 event monitor with SR ST   In ER CT head was normal and MRI of brian without acute abnormality.   Lt mastoid effusion.    BP has ben 160/85 to 143/81  Sat 157/82 to 149/81    Today he noted that staying in hot shower and he felt better but BP 062 systolic -   His numbness is Rt face Rt arm and Rt side of body.  No change in motor activity.  We discussed possible cervical disease, BP alone.  He is asking about carotid disease.   Mother has hx of HTN.  He is active and he has stopped caffeine..  With activity symptoms have not occurred.  His amlodipine has only been on 10 mg for 3 days.  No chest pain.   Past Medical History:  Diagnosis Date  . History of cardiac catheterization    Normal coronaries - approximately 20 years ago  . Hypertension   . Obstructive sleep apnea   . Vitamin D deficiency     History reviewed. No pertinent surgical history.   Current Outpatient Medications  Medication Sig Dispense Refill  . amLODipine (NORVASC) 10 MG tablet Take 1 tablet (10 mg total) by mouth daily. 30 tablet 0  . aspirin EC 81 MG tablet Take 81 mg by mouth daily.    . B Complex Vitamins (VITAMIN-B COMPLEX PO) Take 1 tablet by mouth daily.     . cholecalciferol (VITAMIN D) 1000 units tablet Take 1,000 Units by mouth 2 (two) times daily.    . Multiple Vitamin (MULTIVITAMIN) tablet Take 1 tablet by mouth daily.     No current facility-administered medications for this visit.      Allergies:   Penicillins and Sulfur    Social History:  The patient  reports that he has never smoked. He has never used smokeless tobacco. He reports that he does not drink alcohol or use drugs.   Family History:  The patient's family history includes Hypertension in his mother; Supraventricular tachycardia in his mother.    ROS:  General:no colds or fevers, no weight changes Skin:no rashes or ulcers HEENT:no blurred vision, no congestion CV:see HPI PUL:see HPI GI:no diarrhea constipation or melena, no indigestion GU:no hematuria, no dysuria MS:no joint pain, no claudication Neuro:no syncope, no lightheadedness Endo:no diabetes, no thyroid disease  Wt Readings from Last 3 Encounters:  07/03/18 225 lb 6.4 oz (102.2 kg)  06/12/18 220 lb (99.8 kg)  11/29/17 224 lb 9.6 oz (101.9 kg)     PHYSICAL EXAM: VS:  BP (!) 142/86   Pulse 85   Ht 5' 11.5" (1.816 m)   Wt 225 lb 6.4 oz (102.2 kg)   SpO2 97%   BMI 31.00 kg/m  , BMI Body mass index is 31 kg/m. General:Pleasant affect, NAD Skin:Warm and dry, brisk capillary refill HEENT:normocephalic, sclera clear, mucus membranes moist Neck:supple,  no JVD, no bruits  Heart:S1S2 RRR without murmur, gallup, rub or click Lungs:clear without rales, rhonchi, or wheezes ZHG:DJME, non tender, + BS, do not palpate liver spleen or masses Ext:no lower ext edema, 2+ pedal pulses, 2+ radial pulses Neuro:alert and oriented, MAE, follows commands, + facial symmetry    EKG:  EKG is NOT ordered today. The ekg from ER normal EKG  Recent Labs: 06/12/2018: ALT 26; BUN 15; Creatinine, Ser 1.24; Hemoglobin 15.2; Platelets 237; Potassium 4.3; Sodium 139    Lipid Panel No results found for: CHOL, TRIG, HDL, CHOLHDL, VLDL, LDLCALC, LDLDIRECT     Other studies Reviewed: Additional studies/ records that were reviewed today include: . Echo 2017 Study Conclusions  - Left ventricle: The cavity size was normal. Wall thickness was   increased in  a pattern of mild LVH. Systolic function was normal.   The estimated ejection fraction was in the range of 60% to 65%.   Wall motion was normal; there were no regional wall motion   abnormalities. Doppler parameters are consistent with abnormal   left ventricular relaxation (grade 1 diastolic dysfunction). - Mitral valve: Calcified annulus. - Left atrium: The atrium was at the upper limits of normal in   size. - Right atrium: The atrium was mildly dilated. Central venous   pressure (est): 3 mm Hg. - Atrial septum: No defect or patent foramen ovale was identified. - Tricuspid valve: There was physiologic regurgitation. - Pulmonary arteries: PA peak pressure: 16 mm Hg (S). - Pericardium, extracardiac: There was no pericardial effusion.  Impressions:  - Mild LVH with LVEF 60-65%. Grade 1 diastolic dysfunction with   normal estimated LV filling pressure. Mildly calcified mitral   annulus. Upper normal left atrial chamber size. Normal estimated   PASP 16 mmHg. Exercise Myoview 10/21/2015:  Blood pressure demonstrated a hypertensive response to exercise.  Horizontal ST segment depression ST segment depression of 2 mm was noted during stress in the II, III and aVF leads. Nonspecific finding in setting of hypertensive response  The study is normal. There are no myocardial perfusion defects.  This is a low risk study.  The left ventricular ejection fraction is normal (55-65%).   ASSESSMENT AND PLAN:  1.  HTN, uncontrolled.  Improved with increase of amlodipine if no improvement will add losartan 50 mg -   Labs from ER stable check Echo for LVH.  2.  Rt facial numbness, Rt arm and Rt side of chest, will refer to neuro for further eval with normal CT head and MRI, ? Cervical disc. Disease.  Will check carotids as well.   Follow up with Dr. Percival Spanish  Current medicines are reviewed with the patient today.  The patient Has no concerns regarding medicines.  The following changes have  been made:  See above Labs/ tests ordered today include:see above  Disposition:   FU:  see above  Signed, Cecilie Kicks, NP  07/03/2018 11:53 AM    [address], [place], [place], [place] Phone: [phone]; Fax: [phone]

## 2018-07-03 ENCOUNTER — Ambulatory Visit (INDEPENDENT_AMBULATORY_CARE_PROVIDER_SITE_OTHER): Payer: Medicare HMO | Admitting: Cardiology

## 2018-07-03 ENCOUNTER — Encounter: Payer: Self-pay | Admitting: Cardiology

## 2018-07-03 VITALS — BP 142/86 | HR 85 | Ht 71.5 in | Wt 225.4 lb

## 2018-07-03 DIAGNOSIS — R200 Anesthesia of skin: Secondary | ICD-10-CM

## 2018-07-03 DIAGNOSIS — I10 Essential (primary) hypertension: Secondary | ICD-10-CM

## 2018-07-03 MED ORDER — LOSARTAN POTASSIUM 50 MG PO TABS: 50.0000 mg | ORAL_TABLET | Freq: Every day | ORAL | 3 refills | Status: DC

## 2018-07-03 NOTE — Patient Instructions (Addendum)
Your physician has recommended you make the following change in your medication:  Start LOSARTAN 31 [place] has requested that you have a carotid duplex. This test is an ultrasound of the carotid arteries in your neck. It looks at blood flow through these arteries that supply the brain with blood. Allow one hour for this exam. There are no restrictions or special instructions.  Your physician has requested that you have an echocardiogram. Echocardiography is a painless test that uses sound waves to create images of your heart. It provides your doctor with information about the size and shape of your heart and how well your heart's chambers and valves are working. This procedure takes approximately one hour. There are no restrictions for this procedure.  You have been referred to  [place] have been referred to [place] [facility]

## 2018-07-05 ENCOUNTER — Encounter: Payer: Self-pay | Admitting: Neurology

## 2018-07-05 ENCOUNTER — Ambulatory Visit (INDEPENDENT_AMBULATORY_CARE_PROVIDER_SITE_OTHER): Payer: Medicare HMO | Admitting: Neurology

## 2018-07-05 VITALS — BP 150/80 | HR 82 | Ht 71.5 in | Wt 225.0 lb

## 2018-07-05 DIAGNOSIS — G459 Transient cerebral ischemic attack, unspecified: Secondary | ICD-10-CM

## 2018-07-05 NOTE — Progress Notes (Signed)
[facility] Clinic Note - Initial Visit   Date: 07/05/18  [name] MRN: [id number] DOB: 11-11-1950   Dear Dr. Dorene Ar:  Thank you for your kind referral of Jesus Juarez for consultation of numbness of the right arm and face. Although his history is well known to you, please allow [place] to reiterate it for the purpose of our medical record. The patient was accompanied to the clinic by self.    History of Present Illness: Jesus Juarez is a 68 y.o. left-handed Caucasian male with hypertension OSA and hypertension presenting for evaluation of right sided paresthesias.   Starting on 06/11/2018, he began having spells of tingling involving the entire right arm and numbness over the face, lasting 30-seconds and then resolving.  This occurs 1-2 times per day. There is no speech changes, vision changes, or limb weakness.   He went to the ER on 2/10 where CT head and MRI head was normal. He was recommended to take aspirin 81mg , but is not compliant with taking this due to reading side effects.  He does not have history of hypertension and blood pressure was elevated 175/88 and was started on amlodipine.  Over the next two weeks, he continued to have spells of right face, arm, and torso numbness/tingling, the longest which lasted 5 minute.  He noticed that it was always be associated with his blood pressure being elevated.  He called his cardiologist who recommended increasing amlodipine to 10mg  daily.  He has not had any spells since 2/28.   He saw cardiology yesterday who has ordered echocardiogram and US carotids.  He has no family history of heart disease or stroke.  No known history of diabetes.  He did not tolerate statin therapy in the past and does not want to take this again.  Out-side paper records, electronic medical record, and images have been reviewed where available and summarized as:  MRI brain wo contrast 06/12/2018:  IMPRESSION: 1. Negative brain MRI. No  acute intracranial abnormality identified. 2. Left mastoid effusion.  CT head wo contrast 06/12/2018:  Normal   Past Medical History:  Diagnosis Date  . History of cardiac catheterization    Normal coronaries - approximately 20 years ago  . Hypertension   . Obstructive sleep apnea   . Vitamin D deficiency     History reviewed. No pertinent surgical history.   Medications:  Outpatient Encounter Medications as of 07/05/2018  Medication Sig  . amLODipine (NORVASC) 10 MG tablet Take 1 tablet (10 mg total) by mouth daily.  Marland Kitchen aspirin EC 81 MG tablet Take 81 mg by mouth daily.  . B Complex Vitamins (VITAMIN-B COMPLEX PO) Take 1 tablet by mouth daily.   . cholecalciferol (VITAMIN D) 1000 units tablet Take 1,000 Units by mouth 2 (two) times daily.  Marland Kitchen losartan (COZAAR) 50 MG tablet Take 1 tablet (50 mg total) by mouth daily.  . Multiple Vitamin (MULTIVITAMIN) tablet Take 1 tablet by mouth daily.   No facility-administered encounter medications on file as of 07/05/2018.     Allergies:  Allergies  Allergen Reactions  . Penicillins Hives    Did it involve swelling of the face/tongue/throat, SOB, or low BP? No Did it involve sudden or severe rash/hives, skin peeling, or any reaction on the inside of your mouth or nose? Yes Did you need to seek medical attention at a hospital or doctor's office? Unknown When did it last happen? If all above answers are "NO", may proceed with cephalosporin  use.   . Sulfur Rash    Family History: Family History  Problem Relation Age of Onset  . Supraventricular tachycardia Mother   . Hypertension Mother     Social History: Social History   Tobacco Use  . Smoking status: Never Smoker  . Smokeless tobacco: Never Used  Substance Use Topics  . Alcohol use: No    Alcohol/week: 0.0 standard drinks  . Drug use: No   Social History   Social History Narrative   Lives with wife in a one story home.  Has 4 children.     Retired from  Firefighter) in 2008.     Education: 2 year college degree.     Review of Systems:  CONSTITUTIONAL: No fevers, chills, night sweats, or weight loss.   EYES: No visual changes or eye pain ENT: No hearing changes.  No history of nose bleeds.   RESPIRATORY: No cough, wheezing and shortness of breath.   CARDIOVASCULAR: Negative for chest pain, and palpitations.   GI: Negative for abdominal discomfort, blood in stools or black stools.  No recent change in bowel habits.   GU:  No history of incontinence.   MUSCLOSKELETAL: No history of joint pain or swelling.  No myalgias.   SKIN: Negative for lesions, rash, and itching.   HEMATOLOGY/ONCOLOGY: Negative for prolonged bleeding, bruising easily, and swollen nodes.  No history of cancer.   ENDOCRINE: Negative for cold or heat intolerance, polydipsia or goiter.   PSYCH:  No depression or anxiety symptoms.   NEURO: As Above.   Vital Signs:  BP (!) 150/80   Pulse 82   Ht 5' 11.5" (1.816 m)   Wt 225 lb (102.1 kg)   SpO2 98%   BMI 30.94 kg/m    General Medical Exam:   General:  Well appearing, comfortable.   Eyes/ENT: see cranial nerve examination.   Neck:   No carotid bruits. Respiratory:  Clear to auscultation, good air entry bilaterally.   Cardiac:  Regular rate and rhythm, no murmur.   Extremities:  No deformities, edema, or skin discoloration.  Skin:  No rashes or lesions.  Neurological Exam: MENTAL STATUS including orientation to time, place, person, recent and remote memory, attention span and concentration, language, and fund of knowledge is normal.  Speech is not dysarthric.  CRANIAL NERVES: II:  No visual field defects.  Unremarkable fundi.   III-IV-VI: Pupils equal round and reactive to light.  Normal conjugate, extra-ocular eye movements in all directions of gaze.  No nystagmus.  No ptosis.   V:  Normal facial sensation.    VII:  Normal facial symmetry and movements.   VIII:  Normal hearing and vestibular  function.   IX-X:  Normal palatal movement.   XI:  Normal shoulder shrug and head rotation.   XII:  Normal tongue strength and range of motion, no deviation or fasciculation.  MOTOR:  No atrophy, fasciculations or abnormal movements.  No pronator drift.   Right Upper Extremity:    Left Upper Extremity:    Deltoid  5/5   Deltoid  5/5   Biceps  5/5   Biceps  5/5   Triceps  5/5   Triceps  5/5   Wrist extensors  5/5   Wrist extensors  5/5   Wrist flexors  5/5   Wrist flexors  5/5   Finger extensors  5/5   Finger extensors  5/5   Finger flexors  5/5   Finger flexors  5/5   Dorsal interossei  5/5   Dorsal interossei  5/5   Abductor pollicis  5/5   Abductor pollicis  5/5   Tone (Ashworth scale)  0  Tone (Ashworth scale)  0   Right Lower Extremity:    Left Lower Extremity:    Hip flexors  5/5   Hip flexors  5/5   Hip extensors  5/5   Hip extensors  5/5   Knee flexors  5/5   Knee flexors  5/5   Knee extensors  5/5   Knee extensors  5/5   Dorsiflexors  5/5   Dorsiflexors  5/5   Plantarflexors  5/5   Plantarflexors  5/5   Toe extensors  5/5   Toe extensors  5/5   Toe flexors  5/5   Toe flexors  5/5   Tone (Ashworth scale)  0  Tone (Ashworth scale)  0   MSRs:  Right                                                                 Left brachioradialis 2+  brachioradialis 2+  biceps 2+  biceps 2+  triceps 2+  triceps 2+  patellar 2+  patellar 2+  ankle jerk 2+  ankle jerk 2+  Hoffman no  Hoffman no  plantar response down  plantar response down   SENSORY:  Normal and symmetric perception of light touch, pinprick, vibration, and proprioception.  Romberg's sign absent.   COORDINATION/GAIT: Normal finger-to- nose-finger and heel-to-shin.  Intact rapid alternating movements bilaterally.  Gait narrow based and stable. Tandem and stressed gait intact.    IMPRESSION: Episodic right hemisensory changes involving the face, arm, and torso is most concerning for transient ischemic attack.  Exam is  non-focal today.  NIHSS 0.  He had MRI brain which was personally viewed and is normal with minimal microvascular changes.  Vascular risk factors include newly diagnosed hypertension and dyslipidemia.  He has no history of tobacco use or diabetes.  No family history of cardiovascular disease.  With his episodic symptoms related to blood pressure, pure sensory stuttering lacunar syndrome is considered.   I will order CTA head ASAP to evaluate his intracranial vessels US carotid and echo has been ordered by cardiology I have stressed the importance of medication compliance and encouraged him to take aspirin 81mg  daily and stating statin therapy, but he reports having side effects to statins and does not wish to take anything.  Start lorsartan 50mg  as prescribed by cardiology for hypertension, continue amlodipine 10mg  daily.  Monitor blood pressure at home Check fasting lipid panel, TSH, and HbA1c.   Stroke warning signs discussed and information provided   Thank you for allowing me to participate in patient's care.  If I can answer any additional questions, I would be pleased to do so.    Sincerely,    Levia Waltermire K. Posey Pronto, DO

## 2018-07-05 NOTE — Patient Instructions (Addendum)
Start aspirin 81mg  daily  Start losartan 50mg  daily and continue taking amlodipine  CT angiogram head  We will request your labs from your primary care doctor   Know these warning signs of stroke. Every second counts:  Sudden numbness or weakness of the face, arm or leg, especially on one side of the body,  Sudden confusion, trouble speaking or understanding,  Sudden trouble seeing in one eye or both eyes,  Sudden trouble walking, dizziness, loss of balance or coordination,  Sudden severe headache with no known cause.  If you or someone with you has one or more of these signs, don't delay!  Immediately call 9-1-1, or the emergency medical services (EMS) number so an ambulance (ideally with advanced life support) can be sent for you.  Also, check the time so that you will know when the symptoms first appeared. It's very important to take immediate action. Medical treatment may be available if action is taken early enough.   General guidelines for stroke risk factor management, if present  Hypertension  Target blood pressure <140/90, <130/80 for high risk; normal is 120/80  Hyperlipidemia  Target total cholesterol < 200  Target LDL <100, < 70 for high risk  Target HDL >45 for men, >55 for women  Target triglycerides <150  Diabetes  Target HgbA1c <7%  Physical inactivity  Target is exercise at least 3 times per week  Target waist circumference, in inches is <35 for women and <40 for men

## 2018-07-06 ENCOUNTER — Other Ambulatory Visit: Payer: Self-pay | Admitting: *Deleted

## 2018-07-06 ENCOUNTER — Other Ambulatory Visit (INDEPENDENT_AMBULATORY_CARE_PROVIDER_SITE_OTHER): Payer: Medicare HMO

## 2018-07-06 ENCOUNTER — Telehealth: Payer: Self-pay | Admitting: Neurology

## 2018-07-06 DIAGNOSIS — E559 Vitamin D deficiency, unspecified: Secondary | ICD-10-CM | POA: Diagnosis not present

## 2018-07-06 DIAGNOSIS — G459 Transient cerebral ischemic attack, unspecified: Secondary | ICD-10-CM | POA: Diagnosis not present

## 2018-07-06 LAB — LIPID PANEL
Cholesterol: 188 mg/dL (ref 0–200)
HDL: 47 mg/dL (ref >39.00)
NONHDL: 140.52
Total CHOL/HDL Ratio: 4
Triglycerides: 216 mg/dL — ABNORMAL HIGH (ref 0.0–149.0)
VLDL: 43.2 mg/dL — ABNORMAL HIGH (ref 0.0–40.0)

## 2018-07-06 LAB — HEMOGLOBIN A1C: Hgb A1c MFr Bld: 5.6 % (ref 4.6–6.5)

## 2018-07-06 LAB — TSH: TSH: 2.62 u[IU]/mL (ref 0.35–4.50)

## 2018-07-06 LAB — LDL CHOLESTEROL, DIRECT: Direct LDL: 123 mg/dL

## 2018-07-06 LAB — VITAMIN D 25 HYDROXY (VIT D DEFICIENCY, FRACTURES): VITD: 32.4 ng/mL (ref 30.00–100.00)

## 2018-07-06 NOTE — Telephone Encounter (Signed)
Order placed and [facility] notified.

## 2018-07-06 NOTE — Telephone Encounter (Signed)
Patient had labs drawn today. He called in wanting to know could he have his Vitamin D checked as well? He said in the past it has been low. Thanks

## 2018-07-06 NOTE — Telephone Encounter (Signed)
Try calling lab and see if they can add it.

## 2018-07-06 NOTE — Telephone Encounter (Signed)
Please advise 

## 2018-07-07 ENCOUNTER — Encounter

## 2018-07-07 ENCOUNTER — Ambulatory Visit: Payer: Medicare HMO | Admitting: Cardiology

## 2018-07-10 DIAGNOSIS — R69 Illness, unspecified: Secondary | ICD-10-CM | POA: Diagnosis not present

## 2018-07-12 ENCOUNTER — Telehealth: Payer: Self-pay | Admitting: *Deleted

## 2018-07-12 NOTE — Telephone Encounter (Signed)
Attempted to contact patient. No answer and mailbox is full.

## 2018-07-12 NOTE — Telephone Encounter (Signed)
-----   Message from Alda Berthold, DO sent at 07/11/2018  1:18 PM EDT ----- Please inform patient that his cholesterol is slightly elevated and we can try an alternative medication for cholesterol called zetia, which is not a statin.  His vitamin D is low-normal and he can take OTC vitamin D3 1000 units daily.  No signs of diabetes or thyroid problems.  Thanks.

## 2018-07-13 ENCOUNTER — Telehealth: Payer: Self-pay | Admitting: *Deleted

## 2018-07-13 ENCOUNTER — Other Ambulatory Visit: Payer: Self-pay | Admitting: Cardiology

## 2018-07-13 MED ORDER — AMLODIPINE BESYLATE 10 MG PO TABS: 10.0000 mg | ORAL_TABLET | Freq: Every day | ORAL | 2 refills | Status: DC

## 2018-07-13 NOTE — Telephone Encounter (Signed)
-----   Message from Jesus Berthold, DO sent at 07/11/2018  1:18 PM EDT ----- Please inform patient that his cholesterol is slightly elevated and we can try an alternative medication for cholesterol called zetia, which is not a statin.  His vitamin D is low-normal and he can take OTC vitamin D3 1000 units daily.  No signs of diabetes or thyroid problems.  Thanks.

## 2018-07-13 NOTE — Telephone Encounter (Signed)
I called patient and gave him the results and instructions.  He prefers to try and bring his cholesterol level down with diet and exercise before starting any medication.

## 2018-07-13 NOTE — Telephone Encounter (Signed)
New Message    *STAT* If patient is at the pharmacy, call can be transferred to refill team.   1. Which medications need to be refilled? (please list name of each medication and dose if known) Amlodipine 10mg   2. Which pharmacy/location (including street and city if local pharmacy) is medication to be sent to? [facility] [address]  3. Do they need a 30 day or 90 day supply? 30 day supply

## 2018-07-17 ENCOUNTER — Other Ambulatory Visit: Payer: Self-pay | Admitting: Cardiology

## 2018-07-17 ENCOUNTER — Other Ambulatory Visit: Payer: Self-pay

## 2018-07-17 ENCOUNTER — Ambulatory Visit (HOSPITAL_COMMUNITY)
Admission: RE | Admit: 2018-07-17 | Discharge: 2018-07-17 | Disposition: A | Discharge status: Home / Self Care | Payer: Medicare HMO | Source: Ambulatory Visit | Attending: Cardiovascular Disease | Admitting: Cardiovascular Disease

## 2018-07-17 ENCOUNTER — Ambulatory Visit (HOSPITAL_BASED_OUTPATIENT_CLINIC_OR_DEPARTMENT_OTHER): Payer: Medicare HMO

## 2018-07-17 DIAGNOSIS — G4730 Sleep apnea, unspecified: Secondary | ICD-10-CM | POA: Diagnosis not present

## 2018-07-17 DIAGNOSIS — R200 Anesthesia of skin: Secondary | ICD-10-CM | POA: Insufficient documentation

## 2018-07-17 DIAGNOSIS — I10 Essential (primary) hypertension: Secondary | ICD-10-CM | POA: Insufficient documentation

## 2018-07-17 DIAGNOSIS — I6523 Occlusion and stenosis of bilateral carotid arteries: Secondary | ICD-10-CM | POA: Insufficient documentation

## 2018-07-19 ENCOUNTER — Telehealth: Payer: Self-pay | Admitting: *Deleted

## 2018-07-19 NOTE — Telephone Encounter (Signed)
-----   Message from Charlie Pitter, [place] sent at 07/18/2018 10:23 AM EDT ----- Note - pt also had carotid duplex report under different result note

## 2018-07-19 NOTE — Telephone Encounter (Signed)
Called pt re: Echocardiogram results.  Voicemail is full and couldn't leave a message.

## 2018-07-19 NOTE — Telephone Encounter (Signed)
Pt returned my call and he has been made aware of his Echocardiogram / [place] Carotid US results and he verbalized understanding.

## 2018-07-20 ENCOUNTER — Telehealth: Payer: Self-pay | Admitting: Neurology

## 2018-07-20 ENCOUNTER — Ambulatory Visit
Admission: RE | Admit: 2018-07-20 | Discharge: 2018-07-20 | Disposition: A | Discharge status: Home / Self Care | Payer: Medicare HMO | Source: Ambulatory Visit | Attending: Neurology | Admitting: Neurology

## 2018-07-20 ENCOUNTER — Other Ambulatory Visit: Payer: Self-pay

## 2018-07-20 DIAGNOSIS — G459 Transient cerebral ischemic attack, unspecified: Secondary | ICD-10-CM | POA: Diagnosis not present

## 2018-07-20 DIAGNOSIS — I672 Cerebral atherosclerosis: Secondary | ICD-10-CM | POA: Diagnosis not present

## 2018-07-20 MED ORDER — IOPAMIDOL (ISOVUE-370) INJECTION 76%
75.0000 mL | Freq: Once | INTRAVENOUS | Status: AC | PRN
  Administered 2018-07-20: 75 mL via INTRAVENOUS

## 2018-07-20 MED ORDER — PRAVASTATIN SODIUM 20 MG PO TABS: 20.0000 mg | ORAL_TABLET | Freq: Every day | ORAL | 3 refills | Status: DC

## 2018-07-20 MED ORDER — CLOPIDOGREL BISULFATE 75 MG PO TABS: 75.0000 mg | ORAL_TABLET | Freq: Every day | ORAL | 3 refills | Status: DC

## 2018-07-20 NOTE — Telephone Encounter (Signed)
Patient left a message stating he was returning Dr Posey Pronto call

## 2018-07-20 NOTE — Telephone Encounter (Signed)
Results of CTA head discussed with patient which shows moderate to severe intracranial atherosclerotic disease, with occlusion of the LEFT M1 MCA 2 mm beyond its origin. Collateral flow reconstitutes the LEFT M2 and M3 branches.  US carotids is unremarkable (1-39%) bilaterally.   Left M1 disease correlates with right hemisensory changes, which he continues to have experience lasting 30 seconds to 4 minutes.    I will plan for ASAP cerebral angiogram to better assess his vessel status and determine if he is a candidate for endovascular therapy, given ongoing symptoms.   In the meantime, he will be started on plavix 75mg  daily + aspirin 81mg  (already taking) + pravastatin 20mg  daily.  He is intolerant to statins in the past, but willing to try low dose.   Stroke warning signs discussed, if symptoms last longer or he develops new neurological symptoms, call 911.  Vahan Wadsworth K. Posey Pronto, DO

## 2018-07-20 NOTE — Telephone Encounter (Signed)
Referral sent 

## 2018-07-21 ENCOUNTER — Telehealth: Payer: Self-pay | Admitting: Neurology

## 2018-07-21 NOTE — Telephone Encounter (Signed)
Patient called and has a few questions for you. Please Call at [phone]. Thanks

## 2018-07-21 NOTE — Telephone Encounter (Signed)
I called patient back and he has still not heard from Dr. Arlean Hopping office yet.  Informed him that I will call them again.  I called and had to leave another message for the scheduler to call me back.

## 2018-07-24 ENCOUNTER — Telehealth: Payer: Self-pay | Admitting: Neurology

## 2018-07-24 NOTE — Telephone Encounter (Signed)
Patient is calling in saying that he is unable to get in touch with Dr. Matthew Saras. This was a Doctor that he was referred to from [place]. HE has reached out 2xs to get an appt and can't get a return call. Please call him back at [phone]. Thanks!

## 2018-07-25 ENCOUNTER — Telehealth (HOSPITAL_COMMUNITY): Payer: Self-pay | Admitting: Radiology

## 2018-07-25 NOTE — Telephone Encounter (Signed)
Returned call to patient after discussing his care with Dr. Estanislado Pandy. He has purely sensory symptoms which have reduced in duration since starting aspirin and plavix. Symptoms continue to occur daily, lasting 30 seconds - 1 minute.  He does not have facial weakness, right arm/leg weakness, or speech change.  Due to coronavirus restrictions, elective cerebral angiograms are not being performed at this time.      Recommend continuing dual antiplatelet therapy with aspirin 81mg  + plavix 75mg  daily I would like him on a more potent statin medication, such as crestor or lipitor, but he is already experiencing myalgias on pravastatin 20mg  and he is not agreeable to trying a higher dose.   He is not diabetic and does not smoke. BP at home is ranging in 130s/80s. He was encouraged to stay well-hydrated and try to maintain SBP>120 to avoid hypoperfusion.  If he develops sensory symptoms lasting > 30 minutes or lateralizing weakness or speech changes, he was instructed to call 911. All questions answered.  Donika K. Posey Pronto, DO

## 2018-07-25 NOTE — Telephone Encounter (Signed)
Called pt to notify him that we are currently unable to perform his cerebral angiogram due to the current restrictions from the COVID-19 virus. The patient asks that I call Deveshwar back and see if this is not considered emergent due to the fact that he is having these sx and TIA's on a daily basis. I will contact Deveshwar and call the patient back. He agrees to this plan of care. JM

## 2018-07-25 NOTE — Telephone Encounter (Signed)
Called pt, told him that we are not able to schedule the cerebral angiogram with Deveshwar right now due to restrictions. Pt still wanted to proceed as he says he is having TIA sx daily. The patient's wife called back a couple of hours later and said that Dr. Posey Pronto wanted [place] to get this on the schedule ASAP. I called Dr. Estanislado Pandy and he states that he has spoken with Dr. Posey Pronto today and the plan is conservative management with antiplatelet therapy for the present time. JM

## 2018-07-27 ENCOUNTER — Other Ambulatory Visit (HOSPITAL_COMMUNITY): Payer: Self-pay | Admitting: Interventional Radiology

## 2018-07-27 DIAGNOSIS — G459 Transient cerebral ischemic attack, unspecified: Secondary | ICD-10-CM

## 2018-08-09 ENCOUNTER — Telehealth: Payer: Self-pay

## 2018-08-09 NOTE — Telephone Encounter (Signed)
Virtual Visit Pre-Appointment Phone Call  Steps For Call: [place] PHONE  1. Confirm consent - "In the setting of the current Covid19 crisis, you are scheduled for a (phone or video) visit with your provider on (date) at (time).  Just as we do with many in-office visits, in order for you to participate in this visit, we must obtain consent.  If you'd like, I can send this to your mychart (if signed up) or email for you to review.  Otherwise, I can obtain your verbal consent now.  All virtual visits are billed to your insurance company just like a normal visit would be.  By agreeing to a virtual visit, we'd like you to understand that the technology does not allow for your provider to perform an examination, and thus may limit your provider's ability to fully assess your condition.  Finally, though the technology is pretty good, we cannot assure that it will always work on either your or our end, and in the setting of a video visit, we may have to convert it to a phone-only visit.  In either situation, we cannot ensure that we have a secure connection.  Are you willing to proceed?"  2. Give patient instructions for WebEx download to smartphone as below if video visit  3. Advise patient to be prepared with any vital sign or heart rhythm information, their current medicines, and a piece of paper and pen handy for any instructions they may receive the day of their visit  4. Inform patient they will receive a phone call 15 minutes prior to their appointment time (may be from unknown caller ID) so they should be prepared to answer  5. Confirm that appointment type is correct in Epic appointment notes (video vs telephone)    TELEPHONE CALL NOTE  Jesus Juarez has been deemed a candidate for a follow-up tele-health visit to limit community exposure during the Covid-19 pandemic. I spoke with the patient via phone to ensure availability of phone/video source, confirm preferred email & phone  number, and discuss instructions and expectations.  I reminded Jesus Juarez to be prepared with any vital sign and/or heart rhythm information that could potentially be obtained via home monitoring, at the time of his visit. I reminded Jesus Juarez to expect a phone call at the time of his visit if his visit.  Did the patient verbally acknowledge consent to treatment?   [place] 08/09/2018 2:47 PM   DOWNLOADING THE [place]  - If Apple, go to [employer] and type in WebEx in the search bar. [employer], the blue/[place]heliah [place] circle. The app is free but as with any other app downloads, their phone may require them to verify saved payment information or Apple password. The patient does NOT have to create an account.  - If Android, ask patient to go to [employer] and type in WebEx in the search bar. [place] [employer], the blue/[place] circle. The app is free but as with any other app downloads, their phone may require them to verify saved payment information or Android password. The patient does NOT have to create an account.   CONSENT FOR TELE-HEALTH VISIT - PLEASE REVIEW  I hereby voluntarily request, consent and authorize [facility] and its employed or contracted physicians, physician assistants, nurse practitioners or other licensed health care professionals (the Practitioner), to provide me with telemedicine health care services (the "Services") as deemed necessary  by the treating Practitioner. I acknowledge and consent to receive the Services by the Practitioner via telemedicine. I understand that the telemedicine visit will involve communicating with the Practitioner through live audiovisual communication technology and the disclosure of certain medical information by electronic transmission. I acknowledge that I have been given the opportunity to request an in-person assessment or other available alternative prior to the  telemedicine visit and am voluntarily participating in the telemedicine visit.  I understand that I have the right to withhold or withdraw my consent to the use of telemedicine in the course of my care at any time, without affecting my right to future care or treatment, and that the Practitioner or I may terminate the telemedicine visit at any time. I understand that I have the right to inspect all information obtained and/or recorded in the course of the telemedicine visit and may receive copies of available information for a reasonable fee.  I understand that some of the potential risks of receiving the Services via telemedicine include:  Marland Kitchen Delay or interruption in medical evaluation due to technological equipment failure or disruption; . Information transmitted may not be sufficient (e.g. poor resolution of images) to allow for appropriate medical decision making by the Practitioner; and/or  . In rare instances, security protocols could fail, causing a breach of personal health information.  Furthermore, I acknowledge that it is my responsibility to provide information about my medical history, conditions and care that is complete and accurate to the best of my ability. I acknowledge that Practitioner's advice, recommendations, and/or decision may be based on factors not within their control, such as incomplete or inaccurate data provided by me or distortions of diagnostic images or specimens that may result from electronic transmissions. I understand that the practice of medicine is not an exact science and that Practitioner makes no warranties or guarantees regarding treatment outcomes. I acknowledge that I will receive a copy of this consent concurrently upon execution via email to the email address I last provided but may also request a printed copy by calling the office of [place].    I understand that my insurance will be billed for this visit.   I have read or had this consent read to me.  . I understand the contents of this consent, which adequately explains the benefits and risks of the Services being provided via telemedicine.  . I have been provided ample opportunity to ask questions regarding this consent and the Services and have had my questions answered to my satisfaction. . I give my informed consent for the services to be provided through the use of telemedicine in my medical care  By participating in this telemedicine visit I agree to the above.

## 2018-08-16 ENCOUNTER — Telehealth (HOSPITAL_COMMUNITY): Payer: Self-pay | Admitting: Radiology

## 2018-08-16 ENCOUNTER — Telehealth: Payer: Self-pay | Admitting: Cardiology

## 2018-08-16 NOTE — Telephone Encounter (Signed)
Pt returned my call concerning his cerebral angiogram scheduled with Deveshwar this month. I called him yesterday and left a VM for him to call to reschedule this procedure again due to the COVID restrictions being extended. The pt has great concern that he can not keep waiting on this as he has multiple TIA's on a very regular basis. I told him that I would contact Deveshwar and get his thoughts on how to proceed and call him back tomorrow. The patient is in agreement with this plan of care. JM

## 2018-08-16 NOTE — Telephone Encounter (Signed)
Call smartphone [phone] virtual consent/ my chart via text/ pre reg completed

## 2018-08-16 NOTE — Telephone Encounter (Signed)
Tried to call patient to reschedule cerebral angio with Deveshwar scheduled for 08/29/18. No answer and VM is full. Will try again. JM

## 2018-08-16 NOTE — Progress Notes (Signed)
Virtual Visit via Video Note   This visit type was conducted due to national recommendations for restrictions regarding the COVID-19 Pandemic (e.g. social distancing) in an effort to limit this patient's exposure and mitigate transmission in our community.  Due to his co-morbid illnesses, this patient is at least at moderate risk for complications without adequate follow up.  This format is felt to be most appropriate for this patient at this time.  All issues noted in this document were discussed and addressed.  A limited physical exam was performed with this format.  Please refer to the patient's chart for his consent to telehealth for [facility].   Evaluation Performed:  Follow-up visit  Date:  08/17/2018   ID:  Jesus Juarez, DOB [birth year], MRN [id number]  Patient Location: Home Provider Location: Home  PCP:  Rory Percy, MD  Cardiologist:  Minus Breeding, MD  Electrophysiologist:  None   Chief Complaint:  Arm numbness and dizziness  History of Present Illness:    Jesus Juarez is a 68 y.o. male who presents for evaluation of hypertension.  He has a history of a normal cath 20 yrs ago. His last nuclear was in 2017 with low risk study. He had palpitations in 10/2017 and an event monitor with SR/ST.   In ER in Feb he had hand numbness other symptoms suggestive of a possible TIA.  CT head was normal and MRI of brian without acute abnormality.     He has seen a neurologist.  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  He is due to have an angiogram.  He has been started on Plavix.  Today I reviewed these records in some detail with him and with his wife.  He continues to have right-sided numbness in his chest and face and fingers.  He is also been fatigued.  He is having more shortness of breath than he had previously.  He denies any chest pressure, neck or arm discomfort.  He is not describing palpitations, presyncope or syncope.   Is not describing PND or orthopnea.  He said no cough fevers or chills.  He has had some left leg edema.  The patient does not have symptoms concerning for COVID-19 infection (fever, chills, cough, or new shortness of breath).    Past Medical History:  Diagnosis Date   History of cardiac catheterization    Normal coronaries - approximately 20 years ago   Hypertension    Obstructive sleep apnea    Vitamin D deficiency    Prior to Admission medications   Medication Sig Start Date End Date Taking? Authorizing Provider  amLODipine (NORVASC) 10 MG tablet TAKE 1 TABLET BY MOUTH EVERY DAY 07/17/18  Yes Satira Sark, MD  aspirin EC 81 MG tablet Take 81 mg by mouth daily.   Yes [provider]  B Complex Vitamins (VITAMIN-B COMPLEX PO) Take 1 tablet by mouth daily.    Yes [provider]  cholecalciferol (VITAMIN D) 1000 units tablet Take 1,000 Units by mouth 2 (two) times daily.   Yes [provider]  clopidogrel (PLAVIX) 75 MG tablet Take 1 tablet (75 mg total) by mouth daily. 07/20/18  Yes Patel, Arvin Collard K, DO  Multiple Vitamin (MULTIVITAMIN) tablet Take 1 tablet by mouth daily.   Yes [provider]  pravastatin (PRAVACHOL) 20 MG tablet Take 1 tablet (20 mg total) by mouth daily. 07/20/18  Yes Narda Amber K, DO  Current Meds  Medication Sig   amLODipine (NORVASC) 10 MG tablet TAKE 1 TABLET BY MOUTH EVERY DAY   aspirin EC 81 MG tablet Take 81 mg by mouth daily.   B Complex Vitamins (VITAMIN-B COMPLEX PO) Take 1 tablet by mouth daily.    cholecalciferol (VITAMIN D) 1000 units tablet Take 1,000 Units by mouth 2 (two) times daily.   clopidogrel (PLAVIX) 75 MG tablet Take 1 tablet (75 mg total) by mouth daily.   Multiple Vitamin (MULTIVITAMIN) tablet Take 1 tablet by mouth daily.   pravastatin (PRAVACHOL) 20 MG tablet Take 1 tablet (20 mg total) by mouth daily.     Allergies:   Penicillins and Sulfur   Social History   Tobacco Use    Smoking status: Never Smoker   Smokeless tobacco: Never Used  Substance Use Topics   Alcohol use: No    Alcohol/week: 0.0 standard drinks   Drug use: No     Family Hx: The patient's family history includes Hypertension in his mother; Supraventricular tachycardia in his mother.  ROS:   Please see the history of present illness.    As stated in the HPI and negative for all other systems.   Prior CV studies:   The following studies were reviewed today:  CT, echo  Labs/Other Tests and Data Reviewed:    EKG:  An ECG dated 06/13/18 was personally reviewed today and demonstrated:  NSR, rate 73, no acute ST T wave changes  Recent Labs: 06/12/2018: ALT 26; BUN 15; Creatinine, Ser 1.24; Hemoglobin 15.2; Platelets 237; Potassium 4.3; Sodium 139 07/06/2018: TSH 2.62   Recent Lipid Panel Lab Results  Component Value Date/Time   CHOL 188 07/06/2018 07:29 AM   TRIG 216.0 (H) 07/06/2018 07:29 AM   HDL 47.00 07/06/2018 07:29 AM   CHOLHDL 4 07/06/2018 07:29 AM   LDLDIRECT 123.0 07/06/2018 07:29 AM    Wt Readings from Last 3 Encounters:  08/17/18 215 lb (97.5 kg)  07/05/18 225 lb (102.1 kg)  07/03/18 225 lb 6.4 oz (102.2 kg)     Objective:    Vital Signs:  BP (!) 156/78    Pulse 77    Ht 5\' 11"  (1.803 m)    Wt 215 lb (97.5 kg)    BMI 29.99 kg/m    Well nourished, well developed male in no acute distress.   ASSESSMENT & PLAN:    HTN: For now his blood pressure is slightly elevated.  He can keep a 2-week blood pressure diary and he understands that the goal will be blood pressures in the 630Z to 601U systolic and diastolic 70 to 93A.  NEURO SYMPTOMS:   This may well be related to the vascular disease described above.  He is following up with an angiogram.  From my standpoint there is no cardiac etiology that I would suspect.  I do not suspect this to be a dysrhythmia.  He has had normal left ventricular function on his echo.  I think he needs aggressive management for vascular  disease and continued work-up to see if it could be another etiology to the abnormalities identified.  He will have vascular risk factors addressed as below.   DYSLIPIDEMIA: His LDL was elevated at 123.  This was prior to starting the pravastatin.  I do not think he is medically at target with this but he should get this checked in another month or so and we can make adjustments with a goal LDL less than 70.  We talked for  a long time about gradually increasing his exercise and spent a long time talking to the patient and his wife about diet suggesting a Mediterranean diet.  SLEEP APNEA:   Uses CPAP  FATIGUE: The etiology of this is not clear.  It could be his medications which are new.  He has had a normal TSH and is not anemic.  At this point he does not want to try switching his medications since really suggesting started.  COVID-19 Education: The signs and symptoms of COVID-19 were discussed with the patient and how to seek care for testing (follow up with PCP or arrange E-visit).  The importance of social distancing was discussed today.  Time:   Today, I have spent 30 minutes with the patient with telehealth technology discussing the above problems.     Medication Adjustments/Labs and Tests Ordered: Current medicines are reviewed at length with the patient today.  Concerns regarding medicines are outlined above.   Tests Ordered: No orders of the defined types were placed in this encounter.   Medication Changes: No orders of the defined types were placed in this encounter.   Disposition:  Follow up 2 months  Signed, Minus Breeding, MD  08/17/2018 10:26 AM    [facility]

## 2018-08-17 ENCOUNTER — Telehealth (INDEPENDENT_AMBULATORY_CARE_PROVIDER_SITE_OTHER): Payer: Medicare HMO | Admitting: Cardiology

## 2018-08-17 ENCOUNTER — Encounter: Payer: Self-pay | Admitting: Cardiology

## 2018-08-17 VITALS — BP 156/78 | HR 77 | Ht 71.0 in | Wt 215.0 lb

## 2018-08-17 DIAGNOSIS — I10 Essential (primary) hypertension: Secondary | ICD-10-CM | POA: Insufficient documentation

## 2018-08-17 DIAGNOSIS — Z7189 Other specified counseling: Secondary | ICD-10-CM

## 2018-08-17 DIAGNOSIS — R200 Anesthesia of skin: Secondary | ICD-10-CM | POA: Insufficient documentation

## 2018-08-17 DIAGNOSIS — E785 Hyperlipidemia, unspecified: Secondary | ICD-10-CM | POA: Insufficient documentation

## 2018-08-17 DIAGNOSIS — R002 Palpitations: Secondary | ICD-10-CM | POA: Insufficient documentation

## 2018-08-17 DIAGNOSIS — I679 Cerebrovascular disease, unspecified: Secondary | ICD-10-CM | POA: Insufficient documentation

## 2018-08-18 ENCOUNTER — Other Ambulatory Visit: Payer: Self-pay | Admitting: Cardiology

## 2018-08-18 NOTE — Telephone Encounter (Signed)
Amlodipine 10 mg refilled. 

## 2018-08-22 ENCOUNTER — Telehealth: Payer: Self-pay | Admitting: Student

## 2018-08-22 NOTE — Telephone Encounter (Signed)
Patient called office complaining of symptoms.  Called patient at [phone] to review symptoms at the request of Dr. Estanislado Pandy. Patient states that he has daily intermittent "TIAs". States that he experiences transient numbness/tingling of right side (face, arm, leg). Also complains of intermittent dizziness when changing positions (sitting to standing, etc.). Denies weakness of any part of body, facial droop, vision changes, or syncope.  Discussed above with Dr. Estanislado Pandy who recommends current treatment plan- continue with conservative management (DAPT- Plavix and Aspirin) and monitor for symptom changes (head to ED if symptom changes noted). Plan for image-guided diagnostic cerebral arteriogram in June 2020 due to COVID-19. Message sent to [facility] (scheduler) to inform patient and schedule.  Bea Graff Koleton Duchemin, PA-C 08/22/2018, 11:55 AM

## 2018-08-23 ENCOUNTER — Telehealth (HOSPITAL_COMMUNITY): Payer: Self-pay | Admitting: Radiology

## 2018-08-23 NOTE — Telephone Encounter (Signed)
Called pt back and told him per Deveshwar we are still going with conservative management with medications and monitoring of his symptoms. I have scheduled his cerebral angiogram for 10/03/18 at 0830. He was also instructed that should he develop sx of facial droop, slurred speech, weakness, or word finding he is to call 911 and go to the ED ASAP. He agrees with this plan of care. JM

## 2018-08-24 NOTE — Patient Instructions (Signed)
Medication Instructions:  Continue current medications  If you need a refill on your cardiac medications before your next appointment, please call your pharmacy.  Labwork: None Ordered   Testing/Procedures: None Ordered Follow-Up: You will need a follow up appointment in 2 months.  Please call our office 2 months in advance to schedule this appointment.  You may see Minus Breeding, MD or one of the following Advanced Practice Providers on your designated Care Team:   Rosaria Ferries, PA-C . Jory Sims, DNP, ANP    , Jory Sims, DNP, [place], you and your health needs are our priority.  As part of our continuing mission to provide you with exceptional heart care, we have created designated Provider Care Teams.  These Care Teams include your primary Cardiologist (physician) and Advanced Practice Providers (APPs -  Physician Assistants and Nurse Practitioners) who all work together to provide you with the care you need, when you need it.  Thank you for choosing [facility] at [facility]!!

## 2018-08-29 ENCOUNTER — Ambulatory Visit (HOSPITAL_COMMUNITY): Admission: RE | Admit: 2018-08-29 | Payer: Medicare HMO | Source: Ambulatory Visit

## 2018-08-29 ENCOUNTER — Encounter (HOSPITAL_COMMUNITY): Payer: Self-pay

## 2018-09-28 DIAGNOSIS — R002 Palpitations: Secondary | ICD-10-CM | POA: Diagnosis not present

## 2018-09-28 DIAGNOSIS — N400 Enlarged prostate without lower urinary tract symptoms: Secondary | ICD-10-CM | POA: Diagnosis not present

## 2018-09-28 DIAGNOSIS — E559 Vitamin D deficiency, unspecified: Secondary | ICD-10-CM | POA: Diagnosis not present

## 2018-09-28 DIAGNOSIS — R5383 Other fatigue: Secondary | ICD-10-CM | POA: Diagnosis not present

## 2018-09-28 DIAGNOSIS — K219 Gastro-esophageal reflux disease without esophagitis: Secondary | ICD-10-CM | POA: Diagnosis not present

## 2018-09-28 DIAGNOSIS — G4730 Sleep apnea, unspecified: Secondary | ICD-10-CM | POA: Diagnosis not present

## 2018-09-28 DIAGNOSIS — I10 Essential (primary) hypertension: Secondary | ICD-10-CM | POA: Diagnosis not present

## 2018-10-02 ENCOUNTER — Other Ambulatory Visit: Payer: Self-pay | Admitting: Radiology

## 2018-10-03 ENCOUNTER — Encounter (HOSPITAL_COMMUNITY): Payer: Self-pay

## 2018-10-03 ENCOUNTER — Ambulatory Visit (HOSPITAL_COMMUNITY)
Admission: RE | Admit: 2018-10-03 | Discharge: 2018-10-03 | Disposition: A | Discharge status: Home / Self Care | Payer: Medicare HMO | Source: Ambulatory Visit | Attending: Interventional Radiology | Admitting: Interventional Radiology

## 2018-10-03 ENCOUNTER — Other Ambulatory Visit (HOSPITAL_COMMUNITY): Payer: Self-pay | Admitting: Interventional Radiology

## 2018-10-03 ENCOUNTER — Other Ambulatory Visit: Payer: Self-pay

## 2018-10-03 DIAGNOSIS — I10 Essential (primary) hypertension: Secondary | ICD-10-CM | POA: Insufficient documentation

## 2018-10-03 DIAGNOSIS — R200 Anesthesia of skin: Secondary | ICD-10-CM | POA: Diagnosis present

## 2018-10-03 DIAGNOSIS — G459 Transient cerebral ischemic attack, unspecified: Secondary | ICD-10-CM

## 2018-10-03 DIAGNOSIS — Z7982 Long term (current) use of aspirin: Secondary | ICD-10-CM | POA: Diagnosis not present

## 2018-10-03 DIAGNOSIS — Z8249 Family history of ischemic heart disease and other diseases of the circulatory system: Secondary | ICD-10-CM | POA: Insufficient documentation

## 2018-10-03 DIAGNOSIS — Z6831 Body mass index (BMI) 31.0-31.9, adult: Secondary | ICD-10-CM | POA: Diagnosis not present

## 2018-10-03 DIAGNOSIS — G4733 Obstructive sleep apnea (adult) (pediatric): Secondary | ICD-10-CM | POA: Diagnosis not present

## 2018-10-03 DIAGNOSIS — I6603 Occlusion and stenosis of bilateral middle cerebral arteries: Secondary | ICD-10-CM | POA: Diagnosis not present

## 2018-10-03 DIAGNOSIS — Z79899 Other long term (current) drug therapy: Secondary | ICD-10-CM | POA: Insufficient documentation

## 2018-10-03 DIAGNOSIS — E559 Vitamin D deficiency, unspecified: Secondary | ICD-10-CM | POA: Diagnosis not present

## 2018-10-03 DIAGNOSIS — G4730 Sleep apnea, unspecified: Secondary | ICD-10-CM | POA: Diagnosis not present

## 2018-10-03 DIAGNOSIS — Z0001 Encounter for general adult medical examination with abnormal findings: Secondary | ICD-10-CM | POA: Diagnosis not present

## 2018-10-03 DIAGNOSIS — I639 Cerebral infarction, unspecified: Secondary | ICD-10-CM | POA: Diagnosis not present

## 2018-10-03 DIAGNOSIS — Z880 Allergy status to penicillin: Secondary | ICD-10-CM | POA: Diagnosis not present

## 2018-10-03 HISTORY — PX: IR US GUIDE VASC ACCESS RIGHT: IMG2390

## 2018-10-03 HISTORY — PX: IR ANGIO INTRA EXTRACRAN SEL COM CAROTID INNOMINATE BILAT MOD SED: IMG5360

## 2018-10-03 HISTORY — PX: IR ANGIO VERTEBRAL SEL SUBCLAVIAN INNOMINATE UNI L MOD SED: IMG5364

## 2018-10-03 HISTORY — PX: IR ANGIO VERTEBRAL SEL VERTEBRAL UNI R MOD SED: IMG5368

## 2018-10-03 LAB — BASIC METABOLIC PANEL
Anion gap: 10 (ref 5–15)
BUN: 18 mg/dL (ref 8–23)
CO2: 23 mmol/L (ref 22–32)
Calcium: 8.9 mg/dL (ref 8.9–10.3)
Chloride: 106 mmol/L (ref 98–111)
Creatinine, Ser: 1.07 mg/dL (ref 0.61–1.24)
GFR calc Af Amer: >60 mL/min (ref >60)
GFR calc non Af Amer: >60 mL/min (ref >60)
Glucose, Bld: 100 mg/dL — ABNORMAL HIGH (ref 70–99)
Potassium: 3.8 mmol/L (ref 3.5–5.1)
Sodium: 139 mmol/L (ref 135–145)

## 2018-10-03 LAB — CBC
HCT: 41.7 % (ref 39.0–52.0)
Hemoglobin: 14.1 g/dL (ref 13.0–17.0)
MCH: 29.7 pg (ref 26.0–34.0)
MCHC: 33.8 g/dL (ref 30.0–36.0)
MCV: 88 fL (ref 80.0–100.0)
Platelets: 220 10*3/uL (ref 150–400)
RBC: 4.74 MIL/uL (ref 4.22–5.81)
RDW: 12.6 % (ref 11.5–15.5)
WBC: 6.3 10*3/uL (ref 4.0–10.5)
nRBC: 0 % (ref 0.0–0.2)

## 2018-10-03 LAB — PROTIME-INR
INR: 1 (ref 0.8–1.2)
Prothrombin Time: 13.5 seconds (ref 11.4–15.2)

## 2018-10-03 MED ORDER — SODIUM CHLORIDE 0.9 % IV SOLN: Freq: Once | INTRAVENOUS | Status: DC

## 2018-10-03 MED ORDER — FENTANYL CITRATE (PF) 100 MCG/2ML IJ SOLN
INTRAMUSCULAR | Status: AC | PRN
  Administered 2018-10-03: 25 ug via INTRAVENOUS

## 2018-10-03 MED ORDER — SODIUM CHLORIDE 0.9 % IV SOLN
INTRAVENOUS | Status: AC
  Administered 2018-10-03: 11:00:00 via INTRAVENOUS

## 2018-10-03 MED ORDER — FENTANYL CITRATE (PF) 100 MCG/2ML IJ SOLN
INTRAMUSCULAR | Status: AC
  Filled 2018-10-03: qty 2

## 2018-10-03 MED ORDER — NITROGLYCERIN 1 MG/10 ML FOR IR/CATH LAB
INTRA_ARTERIAL | Status: AC | PRN
  Administered 2018-10-03: 200 ug via INTRA_ARTERIAL

## 2018-10-03 MED ORDER — VERAPAMIL HCL 2.5 MG/ML IV SOLN
INTRAVENOUS | Status: AC | PRN
  Administered 2018-10-03: 2.5 mg via INTRA_ARTERIAL

## 2018-10-03 MED ORDER — CLOPIDOGREL BISULFATE 75 MG PO TABS
ORAL_TABLET | ORAL | Status: AC | PRN
  Administered 2018-10-03: 75 mg via ORAL

## 2018-10-03 MED ORDER — VERAPAMIL HCL 2.5 MG/ML IV SOLN
INTRAVENOUS | Status: AC
  Filled 2018-10-03: qty 2

## 2018-10-03 MED ORDER — CLOPIDOGREL BISULFATE 75 MG PO TABS
ORAL_TABLET | ORAL | Status: AC
  Filled 2018-10-03: qty 1

## 2018-10-03 MED ORDER — ASPIRIN 81 MG PO CHEW
CHEWABLE_TABLET | ORAL | Status: AC
  Filled 2018-10-03: qty 1

## 2018-10-03 MED ORDER — ASPIRIN 81 MG PO CHEW
CHEWABLE_TABLET | ORAL | Status: AC | PRN
  Administered 2018-10-03: 81 mg via ORAL

## 2018-10-03 MED ORDER — SODIUM CHLORIDE 0.9 % IV SOLN
INTRAVENOUS | Status: AC | PRN
  Administered 2018-10-03: 10 mL/h via INTRAVENOUS

## 2018-10-03 MED ORDER — LIDOCAINE HCL 1 % IJ SOLN
INTRAMUSCULAR | Status: AC
  Filled 2018-10-03: qty 20

## 2018-10-03 MED ORDER — NITROGLYCERIN 1 MG/10 ML FOR IR/CATH LAB
INTRA_ARTERIAL | Status: AC
  Filled 2018-10-03: qty 10

## 2018-10-03 MED ORDER — MIDAZOLAM HCL 2 MG/2ML IJ SOLN
INTRAMUSCULAR | Status: AC | PRN
  Administered 2018-10-03: 1 mg via INTRAVENOUS

## 2018-10-03 MED ORDER — IOHEXOL 300 MG/ML  SOLN
150.0000 mL | Freq: Once | INTRAMUSCULAR | Status: AC | PRN
  Administered 2018-10-03: 75 mL via INTRA_ARTERIAL

## 2018-10-03 MED ORDER — HEPARIN SODIUM (PORCINE) 1000 UNIT/ML IJ SOLN
INTRAMUSCULAR | Status: AC
  Filled 2018-10-03: qty 1

## 2018-10-03 MED ORDER — AMLODIPINE BESYLATE 10 MG PO TABS
10.0000 mg | ORAL_TABLET | Freq: Once | ORAL | Status: AC
  Administered 2018-10-03: 11:00:00 10 mg via ORAL
  Filled 2018-10-03: qty 1

## 2018-10-03 MED ORDER — HEPARIN SODIUM (PORCINE) 1000 UNIT/ML IJ SOLN
INTRAMUSCULAR | Status: AC | PRN
  Administered 2018-10-03: 2000 [IU] via INTRAVENOUS

## 2018-10-03 MED ORDER — MIDAZOLAM HCL 2 MG/2ML IJ SOLN
INTRAMUSCULAR | Status: AC
  Filled 2018-10-03: qty 2

## 2018-10-03 NOTE — Sedation Documentation (Signed)
Nasal Cannula removed per request of Deveshwar

## 2018-10-03 NOTE — Procedures (Signed)
S/P 4 vesse; cerebral arteriogram RT radial approach Findings. 1.Occluded LT MCA M1  2.approx 75% stenosis of the RT MCA M 1 seg

## 2018-10-03 NOTE — Discharge Instructions (Signed)
Radial Site Care ° °This sheet gives you information about how to care for yourself after your procedure. Your health care provider may also give you more specific instructions. If you have problems or questions, contact your health care provider. °What can I expect after the procedure? °After the procedure, it is common to have: °· Bruising and tenderness at the catheter insertion area. °Follow these instructions at home: °Medicines °· Take over-the-counter and prescription medicines only as told by your health care provider. °Insertion site care °· Follow instructions from your health care provider about how to take care of your insertion site. Make sure you: °? Wash your hands with soap and water before you change your bandage (dressing). If soap and water are not available, use hand sanitizer. °? Change your dressing as told by your health care provider. °? Leave stitches (sutures), skin glue, or adhesive strips in place. These skin closures may need to stay in place for 2 weeks or longer. If adhesive strip edges start to loosen and curl up, you may trim the loose edges. Do not remove adhesive strips completely unless your health care provider tells you to do that. °· Check your insertion site every day for signs of infection. Check for: °? Redness, swelling, or pain. °? Fluid or blood. °? Pus or a bad smell. °? Warmth. °· Do not take baths, swim, or use a hot tub until your health care provider approves. °· You may shower 24-48 hours after the procedure, or as directed by your health care provider. °? Remove the dressing and gently wash the site with plain soap and water. °? Pat the area dry with a clean towel. °? Do not rub the site. That could cause bleeding. °· Do not apply powder or lotion to the site. °Activity ° °· For 24 hours after the procedure, or as directed by your health care provider: °? Do not flex or bend the affected arm. °? Do not push or pull heavy objects with the affected arm. °? Do not  drive yourself home from the hospital or clinic. You may drive 24 hours after the procedure unless your health care provider tells you not to. °? Do not operate machinery or power tools. °· Do not lift anything that is heavier than 10 lb (4.5 kg), or the limit that you are told, until your health care provider says that it is safe. °· Ask your health care provider when it is okay to: °? Return to work or school. °? Resume usual physical activities or sports. °? Resume sexual activity. °General instructions °· If the catheter site starts to bleed, raise your arm and put firm pressure on the site. If the bleeding does not stop, get help right away. This is a medical emergency. °· If you went home on the same day as your procedure, a responsible adult should be with you for the first 24 hours after you arrive home. °· Keep all follow-up visits as told by your health care provider. This is important. °Contact a health care provider if: °· You have a fever. °· You have redness, swelling, or yellow drainage around your insertion site. °Get help right away if: °· You have unusual pain at the radial site. °· The catheter insertion area swells very fast. °· The insertion area is bleeding, and the bleeding does not stop when you hold steady pressure on the area. °· Your arm or hand becomes pale, cool, tingly, or numb. °These symptoms may represent a serious problem   that is an emergency. Do not wait to see if the symptoms will go away. Get medical help right away. Call your local emergency services (911 in the [place] Do not drive yourself to the hospital. °Summary °· After the procedure, it is common to have bruising and tenderness at the site. °· Follow instructions from your health care provider about how to take care of your radial site wound. Check the wound every day for signs of infection. °· Do not lift anything that is heavier than 10 lb (4.5 kg), or the limit that you are told, until your health care provider says  that it is safe. °This information is not intended to replace advice given to you by your health care provider. Make sure you discuss any questions you have with your health care provider. °Document Released: 05/22/2010 Document Revised: 05/25/2017 Document Reviewed: 05/25/2017 °Elsevier Interactive Patient Education © 2019 [employer] Inc[employer] ° °

## 2018-10-03 NOTE — H&P (Signed)
Chief Complaint: Patient was seen in consultation today for cerebral arteriogram at the request of Dr Morrie Sheldon   Supervising Physician: Luanne Bras  Patient Status: [facility] - Out-pt  History of Present Illness: Jesus Juarez is a 68 y.o. male   Presented to ED 06/12/18 for numbness right arm and face Denies N/V; denies speech or visual changes HTN-- treated  Referred to Dr Narda Amber  CTA Head 07/20/18:  IMPRESSION: 1. IMPRESSION: 1. Moderate to severe intracranial atherosclerotic disease. Complete occlusion of the LEFT M1 MCA 2 mm beyond its origin. Collateral flow reconstitutes the LEFT M2 and M3 branches. 50% stenosis RIGHT M1 MCA. 2. No acute intracranial findings. 2. No acute intracranial findings.  Intermittent numbness continues-- no additionalsymptoms  Referred to Dr Estanislado Pandy for evaluation/treatment Now scheduled for cerebral arteriogram   Past Medical History:  Diagnosis Date  . History of cardiac catheterization    Normal coronaries - approximately 20 years ago  . Hypertension   . Obstructive sleep apnea   . Vitamin D deficiency     History reviewed. No pertinent surgical history.  Allergies: Penicillins and Sulfur  Medications: Prior to Admission medications   Medication Sig Start Date End Date Taking? Authorizing Provider  amLODipine (NORVASC) 10 MG tablet TAKE 1 TABLET BY MOUTH EVERY DAY 08/18/18  Yes Minus Breeding, MD  aspirin EC 81 MG tablet Take 81 mg by mouth daily.   Yes [provider]  B Complex Vitamins (VITAMIN-B COMPLEX PO) Take 1 tablet by mouth daily.    Yes [provider]  cholecalciferol (VITAMIN D) 1000 units tablet Take 1,000 Units by mouth 2 (two) times daily.   Yes [provider]  clopidogrel (PLAVIX) 75 MG tablet Take 1 tablet (75 mg total) by mouth daily. 07/20/18  Yes Patel, Arvin Collard K, DO  Multiple Vitamin (MULTIVITAMIN) tablet Take 1 tablet by mouth daily.   Yes [provider]   pravastatin (PRAVACHOL) 20 MG tablet Take 1 tablet (20 mg total) by mouth daily. 07/20/18  Yes Patel, Donika K, DO  vitamin C (ASCORBIC ACID) 500 MG tablet Take 500 mg by mouth daily.   Yes [provider]     Family History  Problem Relation Age of Onset  . Supraventricular tachycardia Mother   . Hypertension Mother     Social History   Socioeconomic History  . Marital status: Married    Spouse name: Not on file  . Number of children: 4  . Years of education: 30  . Highest education level: Associate degree: academic program  Occupational History  . Occupation: retired  Scientific laboratory technician  . Financial resource strain: Not on file  . Food insecurity:    Worry: Not on file    Inability: Not on file  . Transportation needs:    Medical: Not on file    Non-medical: Not on file  Tobacco Use  . Smoking status: Never Smoker  . Smokeless tobacco: Never Used  Substance and Sexual Activity  . Alcohol use: No    Alcohol/week: 0.0 standard drinks  . Drug use: No  . Sexual activity: Not on file  Lifestyle  . Physical activity:    Days per week: Not on file    Minutes per session: Not on file  . Stress: Not on file  Relationships  . Social connections:    Talks on phone: Not on file    Gets together: Not on file    Attends religious service: Not on file  Active member of club or organization: Not on file    Attends meetings of clubs or organizations: Not on file    Relationship status: Not on file  Other Topics Concern  . Not on file  Social History Narrative   Lives with wife in a one story home.  Has 4 children.     Retired from Firefighter) in 2008.     Education: 2 year college degree.     Review of Systems: A 12 point ROS discussed and pertinent positives are indicated in the HPI above.  All other systems are negative.  Review of Systems  Constitutional: Negative for activity change, appetite change, fatigue, fever and unexpected weight change.   HENT: Negative for sore throat, tinnitus and trouble swallowing.   Eyes: Negative for visual disturbance.  Respiratory: Negative for cough and shortness of breath.   Cardiovascular: Negative for chest pain.  Gastrointestinal: Negative for abdominal pain.  Musculoskeletal: Negative for back pain and gait problem.  Neurological: Positive for numbness. Negative for dizziness, tremors, seizures, syncope, facial asymmetry, speech difficulty, weakness, light-headedness and headaches.  Psychiatric/Behavioral: Negative for behavioral problems and confusion.    Vital Signs: BP 133/66   Pulse 66   Temp 97.7 F (36.5 C) (Oral)   Resp 20   Ht 5' 11.5" (1.816 m)   Wt 215 lb (97.5 kg)   SpO2 99%   BMI 29.57 kg/m   Physical Exam Vitals signs reviewed.  HENT:     Head: Atraumatic.  Eyes:     Extraocular Movements: Extraocular movements intact.  Neck:     Musculoskeletal: Normal range of motion.  Cardiovascular:     Rate and Rhythm: Normal rate and regular rhythm.     Heart sounds: Normal heart sounds.  Pulmonary:     Effort: Pulmonary effort is normal.     Breath sounds: Normal breath sounds.  Abdominal:     General: Bowel sounds are normal.  Musculoskeletal: Normal range of motion.     Right lower leg: No edema.     Left lower leg: No edema.  Skin:    General: Skin is warm.  Neurological:     Mental Status: He is alert and oriented to person, place, and time.  Psychiatric:        Mood and Affect: Mood normal.        Behavior: Behavior normal.        Thought Content: Thought content normal.        Judgment: Judgment normal.     Imaging: No results found.  Labs:  CBC: Recent Labs    06/12/18 1514 10/03/18 0704  WBC 8.6 6.3  HGB 15.2 14.1  HCT 46.2 41.7  PLT 237 220    COAGS: Recent Labs    06/12/18 1514  INR 0.99  APTT 30    BMP: Recent Labs    06/12/18 1514 10/03/18 0704  NA 139 139  K 4.3 3.8  CL 104 106  CO2 25 23  GLUCOSE 114* 100*  BUN 15 18   CALCIUM 9.1 8.9  CREATININE 1.24 1.07  GFRNONAA 60* >60  GFRAA >60 >60    LIVER FUNCTION TESTS: Recent Labs    06/12/18 1514  BILITOT 0.5  AST 26  ALT 26  ALKPHOS 66  PROT 7.2  ALBUMIN 4.0    TUMOR MARKERS: No results for input(s): AFPTM, CEA, CA199, CHROMGRNA in the last 8760 hours.  Assessment and Plan:  Rt facial and arm numbness --- off and  on since 06/2018 HTN - now treated CTA revealing: IMPRESSION: 1. Moderate to severe intracranial atherosclerotic disease. Complete occlusion of the LEFT M1 MCA 2 mm beyond its origin. Collateral flow reconstitutes the LEFT M2 and M3 branches. 50% stenosis RIGHT M1 MCA. 2. No acute intracranial findings.  Now scheduled for cerebral arteriogram Risks and benefits of cerebral angiogram with intervention were discussed with the patient including, but not limited to bleeding, infection, vascular injury, contrast induced renal failure, stroke or even death.  This interventional procedure involves the use of X-rays and because of the nature of the planned procedure, it is possible that we will have prolonged use of X-ray fluoroscopy.  Potential radiation risks to you include (but are not limited to) the following: - A slightly elevated risk for cancer  several years later in life. This risk is typically less than 0.5% percent. This risk is low in comparison to the normal incidence of human cancer, which is 33% for women and 50% for men according to the [place]. - Radiation induced injury can include skin redness, resembling a rash, tissue breakdown / ulcers and hair loss (which can be temporary or permanent).   The likelihood of either of these occurring depends on the difficulty of the procedure and whether you are sensitive to radiation due to previous procedures, disease, or genetic conditions.   IF your procedure requires a prolonged use of radiation, you will be notified and given written instructions for further  action.  It is your responsibility to monitor the irradiated area for the 2 weeks following the procedure and to notify your physician if you are concerned that you have suffered a radiation induced injury.    All of the patient's questions were answered, patient is agreeable to proceed.  Consent signed and in chart.  Thank you for this interesting consult.  I greatly enjoyed meeting KAZI REPPOND and look forward to participating in their care.  A copy of this report was sent to the requesting provider on this date.  Electronically Signed: Lavonia Drafts, PA-C 10/03/2018, 8:00 AM   I spent a total of  30 Minutes   in face to face in clinical consultation, greater than 50% of which was counseling/coordinating care for cerebral arteriogram

## 2018-10-04 ENCOUNTER — Encounter (HOSPITAL_COMMUNITY): Payer: Self-pay | Admitting: Interventional Radiology

## 2018-10-24 ENCOUNTER — Telehealth: Payer: Self-pay | Admitting: Cardiology

## 2018-10-24 NOTE — Telephone Encounter (Signed)
smartphone/ consent/ my chart/ pre reg completed °

## 2018-10-26 DIAGNOSIS — G4733 Obstructive sleep apnea (adult) (pediatric): Secondary | ICD-10-CM | POA: Diagnosis not present

## 2018-10-30 ENCOUNTER — Telehealth: Payer: Medicare HMO | Admitting: Cardiology

## 2018-10-30 NOTE — Telephone Encounter (Signed)

## 2018-10-30 NOTE — Progress Notes (Signed)
Cardiology Office Note   Date:  10/31/2018   ID:  Jesus Juarez, DOB 1950-07-05, MRN [id number]  PCP:  Rory Percy, MD  Cardiologist:   Minus Breeding, MD   No chief complaint on file.     History of Present Illness: Jesus Juarez is a 68 y.o. male who presents for evaluation of hypertension.  He has a history of a normal cath 20 yrs ago. His last nuclear was in 2017 with low risk study. He had palpitations in 10/2017 and an event monitor with SR/ST. In ER in Feb he had hand numbness other symptoms suggestive of a possible TIA.  CT head was normal and MRI of brian without acute abnormality.     He has seen a neurologist.  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   I saw him virtually for this.  He has since had an angiogram.    The L MCA had complete occlusion.   He had a 75% stenosis of the R MCA prox M1 segment as well.     He called after his appt and reported left leg swelling.  He says that this is been happening since he started his medications including his amlodipine.  He does not feel any new shortness of breath although at times he is breathless with exercise.  This has been chronic.  Is trying to walk an hour a day.  Some days he does okay with this.  Some days he feels more fatigued.  Couple of days he is so tired he slept for a few days.  He does wear CPAP at night but is never really had this interrogated to see if it works well.  He has had no new PND or orthopnea.  He is had no new palpitations, presyncope or syncope.  He has some occasional right-sided numbness.  He has some right-sided chest discomfort that goes across his chest.  He is had this evaluated with a stress perfusion study in 2017.  This is not with exertion.  It happens sporadically.  There are no associated symptoms.  Past Medical History:  Diagnosis Date  . History of cardiac catheterization    Normal coronaries - approximately 20 years ago  .  Hypertension   . Obstructive sleep apnea   . Vitamin D deficiency     Past Surgical History:  Procedure Laterality Date  . IR ANGIO INTRA EXTRACRAN SEL COM CAROTID INNOMINATE BILAT MOD SED  10/03/2018  . IR ANGIO VERTEBRAL SEL SUBCLAVIAN INNOMINATE UNI L MOD SED  10/03/2018  . IR ANGIO VERTEBRAL SEL VERTEBRAL UNI R MOD SED  10/03/2018  . IR US GUIDE VASC ACCESS RIGHT  10/03/2018     Current Outpatient Medications  Medication Sig Dispense Refill  . amLODipine (NORVASC) 10 MG tablet TAKE 1 TABLET BY MOUTH EVERY DAY 30 tablet 5  . aspirin EC 81 MG tablet Take 81 mg by mouth daily.    . B Complex Vitamins (VITAMIN-B COMPLEX PO) Take 1 tablet by mouth daily.     . cholecalciferol (VITAMIN D) 1000 units tablet Take 1,000 Units by mouth 2 (two) times daily.    . clopidogrel (PLAVIX) 75 MG tablet Take 1 tablet (75 mg total) by mouth daily. 30 tablet 3  . Multiple Vitamin (MULTIVITAMIN) tablet Take 1 tablet by mouth daily.    . pravastatin (PRAVACHOL) 20 MG tablet Take 1 tablet (20 mg total) by mouth daily. [place]  tablet 3  . vitamin C (ASCORBIC ACID) 500 MG tablet Take 500 mg by mouth daily.     No current facility-administered medications for this visit.     Allergies:   Penicillins and Sulfur    ROS:  Please see the history of present illness.   Otherwise, review of systems are positive for none.   All other systems are reviewed and negative.    PHYSICAL EXAM: VS:  BP (!) 162/87   Pulse 78   Ht 5' 11.5" (1.816 m)   Wt 227 lb (103 kg)   BMI 31.22 kg/m  , BMI Body mass index is 31.22 kg/m. GENERAL:  Well appearing HEENT:  Pupils equal round and reactive, fundi not visualized, oral mucosa unremarkable NECK:  No jugular venous distention, waveform within normal limits, carotid upstroke brisk and symmetric, no bruits, no thyromegaly LYMPHATICS:  No cervical, inguinal adenopathy LUNGS:  Clear to auscultation bilaterally BACK:  No CVA tenderness CHEST:  Unremarkable HEART:  PMI not displaced  or sustained,S1 and S2 within normal limits, no S3, no S4, no clicks, no rubs, soft apical early peaking nonradiating systolic murmur, no diastolic murmurs ABD:  Flat, positive bowel sounds normal in frequency in pitch, no bruits, no rebound, no guarding, no midline pulsatile mass, no hepatomegaly, no splenomegaly EXT:  2 plus pulses throughout, no edema, no cyanosis no clubbing    EKG:  EKG is not ordered today.   Recent Labs: 06/12/2018: ALT 26 07/06/2018: TSH 2.62 10/03/2018: BUN 18; Creatinine, Ser 1.07; Hemoglobin 14.1; Platelets 220; Potassium 3.8; Sodium 139    Lipid Panel    Component Value Date/Time   CHOL 188 07/06/2018 0729   TRIG 216.0 (H) 07/06/2018 0729   HDL 47.00 07/06/2018 0729   CHOLHDL 4 07/06/2018 0729   VLDL 43.2 (H) 07/06/2018 0729   LDLDIRECT 123.0 07/06/2018 0729      Wt Readings from Last 3 Encounters:  10/31/18 227 lb (103 kg)  10/03/18 215 lb (97.5 kg)  08/17/18 215 lb (97.5 kg)      Other studies Reviewed: Additional studies/ records that were reviewed today include: Labs. Review of the above records demonstrates:  Please see elsewhere in the note.     ASSESSMENT AND PLAN:   HTN:   His blood pressure is elevated today bu he says it is reasonably well controlled but he is aggressive at home.  However, he is consistently been elevated on readings here.  He is having swelling probably related to the Norvasc.  Going to reduce that to 5 and add chlorthalidone 25 mg daily and he can keep a blood pressure diary.    NEURO SYMPTOMS:   He has a vascular disease as described.  He needs continued risk reduction for atherosclerotic disease.  There does not seem to be an embolic etiology to his occlusive disease.  DYSLIPIDEMIA:    His goal LDL is less than 70.  I am going to empirically increase pravastatin to 40 mg daily.  He has not tolerated much in the way of statins but he agrees to try this.  When he comes back to see me he should be fasting and get a  lipid profile.   SLEEP APNEA:   He uses CPAP.  However, he is not sure that this device works appropriately in order to see if we can help him with this.  FATIGUE:  This may be related to the sleep apnea above.  He is had normal TSH and CBC.  No further  work-up.  CHEST PAIN: He has some atypical right-sided chest pain.  He had a normal echo this year.  He had a normal perfusion study in 2017.  There is no suggestion of distal ischemia and is unchanged from 2017.  No further work-up.  Current medicines are reviewed at length with the patient today.  The patient does not have concerns regarding medicines.  The following changes have been made:  As above  Labs/ tests ordered today include: None No orders of the defined types were placed in this encounter.    Disposition:   FU with me in 3 months.      Signed, Minus Breeding, MD  10/31/2018 9:17 AM    [facility]

## 2018-10-31 ENCOUNTER — Ambulatory Visit: Payer: Medicare HMO | Admitting: Cardiology

## 2018-10-31 ENCOUNTER — Other Ambulatory Visit: Payer: Self-pay

## 2018-10-31 ENCOUNTER — Encounter: Payer: Self-pay | Admitting: Cardiology

## 2018-10-31 VITALS — BP 162/87 | HR 78 | Ht 71.5 in | Wt 227.0 lb

## 2018-10-31 DIAGNOSIS — R002 Palpitations: Secondary | ICD-10-CM | POA: Diagnosis not present

## 2018-10-31 DIAGNOSIS — E785 Hyperlipidemia, unspecified: Secondary | ICD-10-CM

## 2018-10-31 DIAGNOSIS — I679 Cerebrovascular disease, unspecified: Secondary | ICD-10-CM

## 2018-10-31 DIAGNOSIS — I10 Essential (primary) hypertension: Secondary | ICD-10-CM

## 2018-10-31 MED ORDER — CHLORTHALIDONE 25 MG PO TABS: 25.0000 mg | ORAL_TABLET | Freq: Every day | ORAL | 3 refills | Status: DC

## 2018-10-31 MED ORDER — AMLODIPINE BESYLATE 5 MG PO TABS: 5.0000 mg | ORAL_TABLET | Freq: Every day | ORAL | 3 refills | Status: DC

## 2018-10-31 MED ORDER — PRAVASTATIN SODIUM 40 MG PO TABS: 40.0000 mg | ORAL_TABLET | Freq: Every day | ORAL | 3 refills | Status: DC

## 2018-10-31 NOTE — Patient Instructions (Signed)
Medication Instructions:  DECREASE YOUR AMLODIPINE TO 5 MG DAILY   START CHLORTHALIDONE 25 MG DAILY   INCREASE YOUR PRAVACHOL TO 40  MG DAILY   If you need a refill on your cardiac medications before your next appointment, please call your pharmacy.   Lab work: WHEN YOU RETURN FOR YOUR FOLLOW UP COME TO OFFICE FASTING SO YOUR CHOLESTEROL CAN BE CHECKED  Testing/Procedures: NONE  Follow-Up: At [facility], you and your health needs are our priority.  As part of our continuing mission to provide you with exceptional heart care, we have created designated Provider Care Teams.  These Care Teams include your primary Cardiologist (physician) and Advanced Practice Providers (APPs -  Physician Assistants and Nurse Practitioners) who all work together to provide you with the care you need, when you need it. You will need a follow up appointment in 3 months IN THE MORNING SO FASTING LABS CAN BE DONE Please call our office 2 months in advance to schedule this appointment.  You may see Minus Breeding, MD or one of the following Advanced Practice Providers on your designated Care Team:   Rosaria Ferries, PA-C . Jory Sims, DNP, ANP

## 2018-11-06 DIAGNOSIS — R69 Illness, unspecified: Secondary | ICD-10-CM | POA: Diagnosis not present

## 2018-11-08 ENCOUNTER — Other Ambulatory Visit: Payer: Self-pay | Admitting: Neurology

## 2018-11-14 ENCOUNTER — Encounter: Payer: Self-pay | Admitting: *Deleted

## 2018-11-14 NOTE — Telephone Encounter (Signed)
This encounter was created in error - please disregard.

## 2018-11-15 ENCOUNTER — Telehealth: Payer: Self-pay | Admitting: *Deleted

## 2018-11-15 NOTE — Telephone Encounter (Signed)
  Will forward to Dr Percival Spanish for review   Yes reduced amlodipine to 5mg     ----- Message -----  From: Nurse Satira Anis  Sent: 11/14/18, 9:55 PM  To: [name]  Subject: RE: Non-Urgent Medical Question    Hello Mrr Kue,  Just wanted to confirm you reduced your Amlodipine to 5 mg as advised at the recent visit with Dr Percival Spanish?       ----- Message -----    From:Roemello Nyoka Lint    Sent:11/12/2018 12:17 PM EDT     PP:HKFEX Hochrein, MD  Subject:Non-Urgent Medical Question    Last two days about an hour after taking meds I have felt very light headed while sitting in a chair. The light headed feelings come and go throughout the day. Took BP today after light headedness 113/65, 113/67. Also feeling very tired and sleepy. Do we need to be concerned about this?

## 2018-11-17 NOTE — Telephone Encounter (Signed)
Discussed with Dr Percival Spanish and will have patient decrease Amlodipine to 2.5 mg, sent my chart message

## 2018-11-20 ENCOUNTER — Telehealth: Payer: Self-pay

## 2018-11-20 NOTE — Telephone Encounter (Signed)
Called patient, he will stay on the 5 mg for now- as his BP has been 128 and 130's/80's and he has not had any more issues with it being low or having symptoms. He did have some issues with it going into the 140's and he will continue to monitor while on the 5 and let us know if any issues, and if so he will decrease to 2.5 mg.  Will route to MD to make aware.

## 2018-12-06 DIAGNOSIS — H6123 Impacted cerumen, bilateral: Secondary | ICD-10-CM | POA: Diagnosis not present

## 2019-01-11 ENCOUNTER — Other Ambulatory Visit (HOSPITAL_COMMUNITY): Payer: Self-pay | Admitting: Interventional Radiology

## 2019-01-11 DIAGNOSIS — I771 Stricture of artery: Secondary | ICD-10-CM

## 2019-01-24 ENCOUNTER — Other Ambulatory Visit: Payer: Self-pay

## 2019-01-24 ENCOUNTER — Ambulatory Visit (HOSPITAL_COMMUNITY)
Admission: RE | Admit: 2019-01-24 | Discharge: 2019-01-24 | Disposition: A | Discharge status: Home / Self Care | Payer: Medicare HMO | Source: Ambulatory Visit | Attending: Interventional Radiology | Admitting: Interventional Radiology

## 2019-01-24 DIAGNOSIS — I771 Stricture of artery: Secondary | ICD-10-CM

## 2019-01-24 DIAGNOSIS — I6601 Occlusion and stenosis of right middle cerebral artery: Secondary | ICD-10-CM | POA: Diagnosis not present

## 2019-01-24 LAB — POCT I-STAT CREATININE: Creatinine, Ser: 1 mg/dL (ref 0.61–1.24)

## 2019-01-24 MED ORDER — IOHEXOL 350 MG/ML SOLN
100.0000 mL | Freq: Once | INTRAVENOUS | Status: AC | PRN
  Administered 2019-01-24: 75 mL via INTRAVENOUS

## 2019-01-30 DIAGNOSIS — R079 Chest pain, unspecified: Secondary | ICD-10-CM | POA: Insufficient documentation

## 2019-01-30 NOTE — Progress Notes (Signed)
Cardiology Office Note   Date:  02/01/2019   ID:  Jesus Juarez, DOB 06/20/1950, MRN [id number]  PCP:  Rory Percy, MD  Cardiologist:   Minus Breeding, MD   Chief Complaint  Patient presents with  . Chest Pain      History of Present Illness: Jesus Juarez is a 68 y.o. male who presents for evaluation of hypertension.  He has a history of a normal cath 20 yrs ago. His last nuclear was in 2017 with low risk study. He had palpitations in 10/2017 and an event monitor with SR/ST. In ER in Feb he had hand numbness other symptoms suggestive of a possible TIA.  CT head was normal and MRI of brian without acute abnormality.     He has seen a neurologist.  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   I saw him virtually for this.  He had an angiogram.    The L MCA had complete occlusion.   He had a 75% stenosis of the R MCA prox M1 segment as well.     At the last visit I increased the Norvasc.  However, he was lightheaded with this and so he went back down to 2.5 mg.   It then went up and he went back to 5.  We also started chlorthalidone.  He has seen his blood pressure fluctuate a little bit.  He does have some mild orthostatic symptoms.  He has some hip and leg pains which he relates to the pravastatin.  He says his blood pressure sometimes in the 140s.  Sometimes it is in the 1 teens.  He sees the diastolics in the [id number] to 0000000.  He does get a little lightheaded when it is low.  He is not having active walking.  He is not having any chest pressure, neck or arm discomfort.  There might be occasional right-sided aching slightly.  Past Medical History:  Diagnosis Date  . History of cardiac catheterization    Normal coronaries - approximately 20 years ago  . Hypertension   . Obstructive sleep apnea   . Vitamin D deficiency     Past Surgical History:  Procedure Laterality Date  . IR ANGIO INTRA EXTRACRAN SEL COM CAROTID INNOMINATE BILAT  MOD SED  10/03/2018  . IR ANGIO VERTEBRAL SEL SUBCLAVIAN INNOMINATE UNI L MOD SED  10/03/2018  . IR ANGIO VERTEBRAL SEL VERTEBRAL UNI R MOD SED  10/03/2018  . IR US GUIDE VASC ACCESS RIGHT  10/03/2018     Current Outpatient Medications  Medication Sig Dispense Refill  . amLODipine (NORVASC) 5 MG tablet Take 5 mg by mouth as directed. Take 1/2 tablet daily    . aspirin EC 81 MG tablet Take 81 mg by mouth daily.    . B Complex Vitamins (VITAMIN-B COMPLEX PO) Take 1 tablet by mouth daily.     . chlorthalidone (HYGROTON) 25 MG tablet Take 1 tablet (25 mg total) by mouth daily. 90 tablet 3  . cholecalciferol (VITAMIN D) 1000 units tablet Take 1,000 Units by mouth 2 (two) times daily.    . clopidogrel (PLAVIX) 75 MG tablet TAKE 1 TABLET BY MOUTH EVERY DAY 90 tablet 1  . Multiple Vitamin (MULTIVITAMIN) tablet Take 1 tablet by mouth daily.    . pravastatin (PRAVACHOL) 40 MG tablet Take 1 tablet (40 mg total) by mouth daily. 90 tablet 3  . vitamin C (ASCORBIC ACID) 500 MG  tablet Take 500 mg by mouth daily.     No current facility-administered medications for this visit.     Allergies:   Penicillins and Sulfur    ROS:  Please see the history of present illness.   Otherwise, review of systems are positive for none.   All other systems are reviewed and negative.    PHYSICAL EXAM: VS:  BP (!) 147/78 (Patient Position: Sitting)   Pulse 75   Wt 225 lb (102.1 kg)   SpO2 98%   BMI 30.94 kg/m  , BMI Body mass index is 30.94 kg/m. GENERAL:  Well appearing NECK:  No jugular venous distention, waveform within normal limits, carotid upstroke brisk and symmetric, no bruits, no thyromegaly LUNGS:  Clear to auscultation bilaterally CHEST:  Unremarkable HEART:  PMI not displaced or sustained,S1 and S2 within normal limits, no S3, no S4, no clicks, no rubs, soft apical systolic murmur nonradiating, early peaking, no diastolic murmurs ABD:  Flat, positive bowel sounds normal in frequency in pitch, no bruits, no  rebound, no guarding, no midline pulsatile mass, no hepatomegaly, no splenomegaly EXT:  2 plus pulses throughout, no edema, no cyanosis no clubbing    EKG:  EKG is not  ordered today.   Recent Labs: 06/12/2018: ALT 26 07/06/2018: TSH 2.62 10/03/2018: BUN 18; Hemoglobin 14.1; Platelets 220; Potassium 3.8; Sodium 139 01/24/2019: Creatinine, Ser 1.00    Lipid Panel    Component Value Date/Time   CHOL 188 07/06/2018 0729   TRIG 216.0 (H) 07/06/2018 0729   HDL 47.00 07/06/2018 0729   CHOLHDL 4 07/06/2018 0729   VLDL 43.2 (H) 07/06/2018 0729   LDLDIRECT 123.0 07/06/2018 0729      Wt Readings from Last 3 Encounters:  02/01/19 225 lb (102.1 kg)  10/31/18 227 lb (103 kg)  10/03/18 215 lb (97.5 kg)      Other studies Reviewed: Additional studies/ records that were reviewed today include: Labs, CT results Review of the above records demonstrates:  See below  ASSESSMENT AND PLAN:   HTN:   His blood pressure is fluctuating slightly and we have a long conversation about this.  He does have some LVH on echo and I think he needs good blood pressure control.  There might be some slowed orthostasis.  He is going to take his amlodipine at night and see if he is got more even control with this.  Otherwise he will remain on meds as listed.   NEURO SYMPTOMS:   He had a repeat CTA.   He does have complete occlusion of the distal M1 which is unchanged with moderate severe segmental stenosis of the proximal M1 of the right MCA.  He is having this followed by neurology and neuroradiology.  This did not seem to be an embolic etiology.   DYSLIPIDEMIA:    His goal LDL is less than 70.   At the last visit I increased the Pravachol to 40 mg.  He did have a good response with this increase in his LDL of 63.  However, he has some muscle aches.  We talked at length about possibly stopping the pravastatin to see if his muscle aches were related to this.  If so he might need a PCSK9 inhibitor.  He is going to  consider whether he wants to do this as the aches that are mild  SLEEP APNEA:   He uses CPAP.  He has an appointment with Dr. Claiborne Billings today.  FATIGUE:  This may be related to the  sleep apnea above.  He is had normal TSH and CBC.  We will continue have follow-up of his sleep apnea and appropriate management to see if this helps.   CHEST PAIN:   He has still some right-sided atypical chest pain.  He had a negative perfusion study in 2017.  Given the atypical symptoms we will continue with risk reduction.    Current medicines are reviewed at length with the patient today.  The patient does not have concerns regarding medicines.  The following changes have been made:  None  Labs/ tests ordered today include:  None No orders of the defined types were placed in this encounter.    Disposition:   FU with me in 6  months.      Signed, Minus Breeding, MD  02/01/2019 8:25 AM    Jesus Juarez

## 2019-01-31 ENCOUNTER — Telehealth (HOSPITAL_COMMUNITY): Payer: Self-pay

## 2019-01-31 NOTE — Telephone Encounter (Signed)
Pt agreed to f/u in 6 months with cta head/neck w/contrast. AW

## 2019-02-01 ENCOUNTER — Encounter: Payer: Self-pay | Admitting: Cardiovascular Disease

## 2019-02-01 ENCOUNTER — Telehealth: Payer: Self-pay | Admitting: Student

## 2019-02-01 ENCOUNTER — Encounter: Payer: Self-pay | Admitting: Cardiology

## 2019-02-01 ENCOUNTER — Ambulatory Visit: Payer: Medicare HMO | Admitting: Cardiology

## 2019-02-01 ENCOUNTER — Telehealth: Payer: Self-pay | Admitting: *Deleted

## 2019-02-01 ENCOUNTER — Ambulatory Visit (INDEPENDENT_AMBULATORY_CARE_PROVIDER_SITE_OTHER): Payer: Medicare HMO | Admitting: Cardiovascular Disease

## 2019-02-01 ENCOUNTER — Other Ambulatory Visit: Payer: Self-pay

## 2019-02-01 VITALS — BP 147/78 | HR 75 | Temp 97.1°F | Ht 71.5 in | Wt 225.0 lb

## 2019-02-01 VITALS — BP 147/78 | HR 75 | Wt 225.0 lb

## 2019-02-01 DIAGNOSIS — I679 Cerebrovascular disease, unspecified: Secondary | ICD-10-CM

## 2019-02-01 DIAGNOSIS — E785 Hyperlipidemia, unspecified: Secondary | ICD-10-CM

## 2019-02-01 DIAGNOSIS — G4733 Obstructive sleep apnea (adult) (pediatric): Secondary | ICD-10-CM

## 2019-02-01 DIAGNOSIS — I10 Essential (primary) hypertension: Secondary | ICD-10-CM

## 2019-02-01 DIAGNOSIS — R079 Chest pain, unspecified: Secondary | ICD-10-CM

## 2019-02-01 NOTE — Telephone Encounter (Signed)
NIR.  Received call from patient requesting phone call to go over results of CTA head/neck 01/24/2019.  Called patient alongside Dr. Estanislado Pandy at [phone]. Explained to patient that his CTA was stable compared to recent diagnostic cerebral arteriogram 10/04/2018 (left MCA occlusion with collaterals, right MCA M1 segment stenosis). Discussed patient's symptoms. States that he "occasionally feels light headed". Denies headache, weakness, numbness/tingling, dizziness, syncope, vision changes, hearing changes, tinnitus, or speech difficulty. Since patient is grossly asymptomatic at this time, recommend continued conservative management including DAPT (Plavix 75 mg once daily and Aspirin 81 mg once daily) and routine imaging scans to monitor for changes, next to be CTA head/neck 6 months from prior scan (schedulers aware to schedule this scan). Patient asks if there is anything to be done regarding left MCA occlusion- informed patient that it is safer to watch this vessel (more risks associated with attempted revascularization of complete occlusion <5-7 days). Patient also states that he has pains in his legs/feet, and asks if it is related to his left MCA occlusion/right MCA M1 segment stenosis. Informed patient that it is unlikely related, and advised patient to follow-up with PCP regarding management of leg/foot pains. Patient also asks about activity level- advised patient to advance activity as tolerated. Advised patient to call 911 immediately if he notices any change to neurologic symptoms (weakness/numbness/tingling of one side of body, slurred speech, vision changes, dizziness/syncope, gait instability). All questions answered and concerns addressed.   Bea Graff Christobal Morado, PA-C 02/01/2019, 1:24 PM

## 2019-02-01 NOTE — Telephone Encounter (Signed)
[place] and s/w Nira Conn. Asked per per TK to link patient's CPAP machine to airview.

## 2019-02-01 NOTE — Patient Instructions (Signed)
Medication Instructions:  The current medical regimen is effective;  continue present plan and medications.  If you need a refill on your cardiac medications before your next appointment, please call your pharmacy.   Follow-Up: At [facility], you and your health needs are our priority.  As part of our continuing mission to provide you with exceptional heart care, we have created designated Provider Care Teams.  These Care Teams include your primary Cardiologist (physician) and Advanced Practice Providers (APPs -  Physician Assistants and Nurse Practitioners) who all work together to provide you with the care you need, when you need it. You will need a follow up appointment in 12 months (sleep).  Please call our office 2 months in advance to schedule this appointment.  You may see Dr.Thomas Claiborne Billings or one of the following Advanced Practice Providers on your designated Care Team: Almyra Deforest, [place] . Fabian Sharp, PA-C  Any Other Special Instructions Will Be Listed Below (If Applicable). We will do a download in 1 month- after we get it linked.

## 2019-02-01 NOTE — Patient Instructions (Signed)
Medication Instructions:  Your physician recommends that you continue on your current medications as directed. Please refer to the Current Medication list given to you today.  If you need a refill on your cardiac medications before your next appointment, please call your pharmacy.   Lab work: NONE  Testing/Procedures: NONE  Follow-Up: At [facility], you and your health needs are our priority.  As part of our continuing mission to provide you with exceptional heart care, we have created designated Provider Care Teams.  These Care Teams include your primary Cardiologist (physician) and Advanced Practice Providers (APPs -  Physician Assistants and Nurse Practitioners) who all work together to provide you with the care you need, when you need it. You will need a follow up appointment in 6 months.  Please call our office 2 months in advance to schedule this appointment.  You may see James Hochrein, MD or one of the following Advanced Practice Providers on your designated Care Team:   Rhonda Barrett, PA-C Kathryn Lawrence, DNP, ANP       

## 2019-02-04 ENCOUNTER — Encounter: Payer: Self-pay | Admitting: Cardiovascular Disease

## 2019-02-04 NOTE — Progress Notes (Signed)
Cardiology Office Note    Date:  02/04/2019   ID:  Jesus Juarez, DOB 1950/12/18, MRN [id number]  PCP:  Rory Percy, MD  Cardiologist:  Shelva Majestic, MD (sleep); Dr. Percival Spanish  New sleep evaluation  History of Present Illness:  Jesus Juarez is a 68 y.o. male who is followed by Dr. Percival Spanish for his cardiology care.  He has a long history of sleep apnea for greater than 25 years.  He presents to sleep clinic today to establish care after recently moving to  [place].  Jesus Juarez has a history of hypertension, and remote TIA with documented branch vessel occlusion of the left middle cerebral artery.  He has had issues with intermittent leg swelling.    Jesus Juarez with sleep apnea over 25 years ago in the early 1990s.   He has utilized several CPAP machines over the years.  He has not seen a sleep physician for a long time.  Recently had been [place] and his DME company was [employer].  It appears that he underwent a reevaluation with Novasom sleep study in August 2017 prior to receiving a new machine and his AHI was 51.9/h.  He had received a ResMed AirSense machine but over the past half a year he had reverted back to using his S9 Elite CPAP machine which is not wireless.  With the move he is packed up his new ResMed and apparently had not unpacked it yet to start using his new machine.  There is concern that since he had not been evaluated in several years that his settings may not be optimal and may need adjustment in verification.  For this reason he was scheduled for a new sleep evaluation with me.  Presently, he admits to 100% CPAP use.  We were able to obtain a download of his S9 Elite non-wireless CPAP machine from his card in the device.  Usage is 100% over 90 days and he is averaging 8 hours and 6 minutes of CPAP use.  At a 10 cm set pressure, AHI remains mildly increased at 6.3 with an apnea index of 3.6 and hypopnea index of 2.7.  He is unaware of breakthrough  snoring.  He denies bruxism, restless legs, hypnagogic hallucinations, or cataplectic events.  Prior initiating CPAP therapy he would have frequent awakenings, and awakening gasping for breath.  He also experienced significant daytime sleepiness.  In the office today I calculated an Epworth Sleepiness Scale score which endorsed at 10 and as shown below:  Epworth Sleepiness Scale: Situation   Chance of Dozing/Sleeping (0 = never , 1 = slight chance , 2 = moderate chance , 3 = high chance )   sitting and reading 1   watching TV 2   sitting inactive in a public place 1   being a passenger in a motor vehicle for an hour or more 2   lying down in the afternoon 2   sitting and talking to someone 0   sitting quietly after lunch (no alcohol) 2   while stopped for a few minutes in traffic as the driver 0   Total Score  10   He had seen Dr. Percival Spanish for his cardiology evaluation this morning and presents today for his sleep clinic evaluation with me.   Past Medical History:  Diagnosis Date  . History of cardiac catheterization    Normal coronaries - approximately 20 years ago  . Hypertension   . Obstructive sleep apnea   .  Vitamin D deficiency     Past Surgical History:  Procedure Laterality Date  . IR ANGIO INTRA EXTRACRAN SEL COM CAROTID INNOMINATE BILAT MOD SED  10/03/2018  . IR ANGIO VERTEBRAL SEL SUBCLAVIAN INNOMINATE UNI L MOD SED  10/03/2018  . IR ANGIO VERTEBRAL SEL VERTEBRAL UNI R MOD SED  10/03/2018  . IR US GUIDE VASC ACCESS RIGHT  10/03/2018    Current Medications: Outpatient Medications Prior to Visit  Medication Sig Dispense Refill  . amLODipine (NORVASC) 5 MG tablet Take 5 mg by mouth as directed. Take 1/2 tablet daily    . aspirin EC 81 MG tablet Take 81 mg by mouth daily.    . B Complex Vitamins (VITAMIN-B COMPLEX PO) Take 1 tablet by mouth daily.     . chlorthalidone (HYGROTON) 25 MG tablet Take 1 tablet (25 mg total) by mouth daily. 90 tablet 3  . cholecalciferol (VITAMIN D)  1000 units tablet Take 1,000 Units by mouth 2 (two) times daily.    . clopidogrel (PLAVIX) 75 MG tablet TAKE 1 TABLET BY MOUTH EVERY DAY 90 tablet 1  . Multiple Vitamin (MULTIVITAMIN) tablet Take 1 tablet by mouth daily.    . pravastatin (PRAVACHOL) 40 MG tablet Take 1 tablet (40 mg total) by mouth daily. 90 tablet 3  . vitamin C (ASCORBIC ACID) 500 MG tablet Take 500 mg by mouth daily.     No facility-administered medications prior to visit.      Allergies:   Penicillins and Sulfur   Social History   Socioeconomic History  . Marital status: Married    Spouse name: Not on file  . Number of children: 4  . Years of education: 10  . Highest education level: Associate degree: academic program  Occupational History  . Occupation: retired  Scientific laboratory technician  . Financial resource strain: Not on file  . Food insecurity    Worry: Not on file    Inability: Not on file  . Transportation needs    Medical: Not on file    Non-medical: Not on file  Tobacco Use  . Smoking status: Never Smoker  . Smokeless tobacco: Never Used  Substance and Sexual Activity  . Alcohol use: No    Alcohol/week: 0.0 standard drinks  . Drug use: No  . Sexual activity: Not on file  Lifestyle  . Physical activity    Days per week: Not on file    Minutes per session: Not on file  . Stress: Not on file  Relationships  . Social Herbalist on phone: Not on file    Gets together: Not on file    Attends religious service: Not on file    Active member of club or organization: Not on file    Attends meetings of clubs or organizations: Not on file    Relationship status: Not on file  Other Topics Concern  . Not on file  Social History Narrative   Lives with wife in a one story home.  Has 4 children.     Retired from [employer]) in 2008.     Education: 2 year college degree.    He retired at age 65.  He has 4 children and 1 grandchild.  Family History:  The patient's family history  includes Hypertension in his mother; Supraventricular tachycardia in his mother.  His father died at age 69.  His mother is still living.  ROS General: Negative; No fevers, chills, or night sweats;  HEENT: Negative; No  changes in vision or hearing, sinus congestion, difficulty swallowing Pulmonary: Negative; No cough, wheezing, shortness of breath, hemoptysis Cardiovascular: Negative; No chest pain, presyncope, syncope, palpitations GI: Negative; No nausea, vomiting, diarrhea, or abdominal pain GU: Negative; No dysuria, hematuria, or difficulty voiding Musculoskeletal: Negative; no myalgias, joint pain, or weakness Hematologic/Oncology: Negative; no easy bruising, bleeding Endocrine: Negative; no heat/cold intolerance; no diabetes Neuro: Negative; no changes in balance, headaches Skin: Negative; No rashes or skin lesions Psychiatric: Negative; No behavioral problems, depression Sleep: Negative; No snoring, daytime sleepiness, hypersomnolence, bruxism, restless legs, hypnogognic hallucinations, no cataplexy Other comprehensive 14 point system review is negative.   PHYSICAL EXAM:   VS:  BP (!) 147/78   Pulse 75   Temp (!) 97.1 F (36.2 C)   Ht 5' 11.5" (1.816 m)   Wt 225 lb (102.1 kg)   SpO2 98%   BMI 30.94 kg/m     Repeat blood pressure by me 136/78  Wt Readings from Last 3 Encounters:  02/01/19 225 lb (102.1 kg)  02/01/19 225 lb (102.1 kg)  10/31/18 227 lb (103 kg)    General: Alert, oriented, no distress.  Skin: normal turgor, no rashes, warm and dry HEENT: Normocephalic, atraumatic. Pupils equal round and reactive to light; sclera anicteric; extraocular muscles intact;  Nose without nasal septal hypertrophy Mouth/Parynx benign; Mallinpatti scale 2 Neck: No JVD, no carotid bruits; normal carotid upstroke Lungs: clear to ausculatation and percussion; no wheezing or rales Chest wall: without tenderness to palpitation Heart: PMI not displaced, RRR, s1 s2 normal, 1/6  systolic murmur, no diastolic murmur, no rubs, gallops, thrills, or heaves Abdomen: soft, nontender; no hepatosplenomehaly, BS+; abdominal aorta nontender and not dilated by palpation. Back: no CVA tenderness Pulses 2+ Musculoskeletal: full range of motion, normal strength, no joint deformities Extremities: no clubbing cyanosis or edema, Homan's sign negative  Neurologic: grossly nonfocal; Cranial nerves grossly wnl Psychologic: Normal mood and affect   Studies/Labs Reviewed:   EKG: Normal sinus rhythm at 60 bpm.  RV conduction delay.  Nonspecific ST changes.  Recent Labs: BMP Latest Ref Rng & Units 01/24/2019 10/03/2018 06/12/2018  Glucose 70 - 99 mg/dL - 100(H) 114(H)  BUN 8 - 23 mg/dL - 18 15  Creatinine 0.61 - 1.24 mg/dL 1.00 1.07 1.24  Sodium 135 - 145 mmol/L - 139 139  Potassium 3.5 - 5.1 mmol/L - 3.8 4.3  Chloride 98 - 111 mmol/L - 106 104  CO2 22 - 32 mmol/L - 23 25  Calcium 8.9 - 10.3 mg/dL - 8.9 9.1     Hepatic Function Latest Ref Rng & Units 06/12/2018  Total Protein 6.5 - 8.1 g/dL 7.2  Albumin 3.5 - 5.0 g/dL 4.0  AST 15 - 41 U/L 26  ALT 0 - 44 U/L 26  Alk Phosphatase 38 - 126 U/L 66  Total Bilirubin 0.3 - 1.2 mg/dL 0.5    CBC Latest Ref Rng & Units 10/03/2018 06/12/2018  WBC 4.0 - 10.5 K/uL 6.3 8.6  Hemoglobin 13.0 - 17.0 g/dL 14.1 15.2  Hematocrit 39.0 - 52.0 % 41.7 46.2  Platelets 150 - 400 K/uL 220 237   Lab Results  Component Value Date   MCV 88.0 10/03/2018   MCV 88.0 06/12/2018   Lab Results  Component Value Date   TSH 2.62 07/06/2018   Lab Results  Component Value Date   HGBA1C 5.6 07/06/2018     BNP No results found for: BNP  ProBNP No results found for: PROBNP   Lipid Panel  Component Value Date/Time   CHOL 188 07/06/2018 0729   TRIG 216.0 (H) 07/06/2018 0729   HDL 47.00 07/06/2018 0729   CHOLHDL 4 07/06/2018 0729   VLDL 43.2 (H) 07/06/2018 0729   LDLDIRECT 123.0 07/06/2018 0729     RADIOLOGY: Ct Angio Head W Or Wo  Contrast  Result Date: 01/24/2019 CLINICAL DATA:  Stenosis of artery EXAM: CT ANGIOGRAPHY HEAD AND NECK TECHNIQUE: Multidetector CT imaging of the head and neck was performed using the standard protocol during bolus administration of intravenous contrast. Multiplanar CT image reconstructions and MIPs were obtained to evaluate the vascular anatomy. Carotid stenosis measurements (when applicable) are obtained utilizing NASCET criteria, using the distal internal carotid diameter as the denominator. CONTRAST:  86m OMNIPAQUE IOHEXOL 350 MG/ML SOLN COMPARISON:  Report from catheter based cerebral angiography 10/03/2018, noncontrast CT head 06/12/2018, brain MRI 06/12/2018, CT angiography head 07/20/2018 FINDINGS: CT HEAD Brain: No evidence of acute intracranial hemorrhage. No demarcated cortical infarction. No evidence of intracranial mass. No midline shift or extra-axial fluid collection. Cerebral volume is normal for age. Vascular: Reported separately. Skull: No calvarial fracture. Unchanged nonspecific 1.6 x 0.7 cm lytic focus within the right parietal bone without aggressive features. Sinuses: Minimal ethmoid sinus mucosal thickening. No significant mastoid effusion. CTA NECK FINDINGS Aortic arch: Standard branching. Atherosclerotic calcification within the visualized aortic arch and major branch vessels. Right carotid system: CCA and ICA patent within the neck without significant stenosis (50% or greater). Mild scattered plaque. Left carotid system: CCA and ICA patent within the neck without significant stenosis (50% or greater). Scattered calcified and noncalcified plaque, most prominent at the bifurcation. There is a small ulcerated plaque laterally at the level of the bifurcation (series 16, images 234-237). Vertebral arteries: Bilateral vertebral arteries are patent within the neck without significant stenosis (50% or greater). Skeleton: No acute bony abnormality. Other neck: Multiple subcentimeter left  thyroid lobe nodules. No soft tissue neck mass. Cervical lymph nodes are prominent in number, although not enlarged. Upper chest: No consolidation within the imaged lung apices. Coronary artery atherosclerotic disease. Review of the MIP images confirms the above findings CTA HEAD FINDINGS Anterior circulation: Bilateral ICA siphons patent without significant stenosis. Similar to prior CTA 07/20/2018 there is moderate/severe segmental stenosis of the proximal M1 right middle cerebral artery. Distal to this, no high-grade stenosis is demonstrated within the M1 or M2 middle cerebral artery branches. There is occlusion of the proximal M1 left middle cerebral artery shortly beyond its origin with multiple small surrounding collateral vessels. Findings are unchanged as compared to prior CTA. As before, there is faint reconstitution of the distal M1 and more distal left MCA branch vessels. The bilateral anterior cerebral arteries are patent without significant proximal stenosis. No intracranial aneurysm is identified. Posterior circulation: Intracranial vertebral arteries are patent without high-grade stenosis. Basilar artery is patent without high-grade stenosis. Bilateral posterior cerebral arteries patent without high-grade proximal stenosis. Venous sinuses: Within limitations of contrast timing, no convincing thrombus. Anatomic variants: None significant Review of the MIP images confirms the above findings IMPRESSION: CT head: Unremarkable noncontrast CT appearance of the brain. No evidence of acute intracranial abnormality. CTA head: 1. Redemonstrated complete occlusion of the M1 left middle cerebral artery just beyond its origin. As before, there is faint distal reconstitution of the distal M1 and more distal left MCA branches. There are numerous small surrounding collateral vessels and findings are suggestive of moyamoya phenomenon. 2. Unchanged moderate/severe segmental stenosis within the proximal M1 right MCA.  CTA neck: 1.  Atherosclerotic disease within the aortic arch and major branch vessels of the neck as described. 2. Common carotid, internal carotid and vertebral arteries patent within the neck bilaterally without significant stenosis (50% or greater). 3. Small ulcerated plaque laterally at the left carotid bifurcation. 4. Cervical lymph nodes are prominent in number but not enlarged. Clinical correlation recommended. 5. Coronary artery atherosclerosis. Electronically Signed   By: Kellie Simmering   On: 01/24/2019 14:17   Ct Angio Neck W Or Wo Contrast  Result Date: 01/24/2019 CLINICAL DATA:  Stenosis of artery EXAM: CT ANGIOGRAPHY HEAD AND NECK TECHNIQUE: Multidetector CT imaging of the head and neck was performed using the standard protocol during bolus administration of intravenous contrast. Multiplanar CT image reconstructions and MIPs were obtained to evaluate the vascular anatomy. Carotid stenosis measurements (when applicable) are obtained utilizing NASCET criteria, using the distal internal carotid diameter as the denominator. CONTRAST:  41m OMNIPAQUE IOHEXOL 350 MG/ML SOLN COMPARISON:  Report from catheter based cerebral angiography 10/03/2018, noncontrast CT head 06/12/2018, brain MRI 06/12/2018, CT angiography head 07/20/2018 FINDINGS: CT HEAD Brain: No evidence of acute intracranial hemorrhage. No demarcated cortical infarction. No evidence of intracranial mass. No midline shift or extra-axial fluid collection. Cerebral volume is normal for age. Vascular: Reported separately. Skull: No calvarial fracture. Unchanged nonspecific 1.6 x 0.7 cm lytic focus within the right parietal bone without aggressive features. Sinuses: Minimal ethmoid sinus mucosal thickening. No significant mastoid effusion. CTA NECK FINDINGS Aortic arch: Standard branching. Atherosclerotic calcification within the visualized aortic arch and major branch vessels. Right carotid system: CCA and ICA patent within the neck without  significant stenosis (50% or greater). Mild scattered plaque. Left carotid system: CCA and ICA patent within the neck without significant stenosis (50% or greater). Scattered calcified and noncalcified plaque, most prominent at the bifurcation. There is a small ulcerated plaque laterally at the level of the bifurcation (series 16, images 234-237). Vertebral arteries: Bilateral vertebral arteries are patent within the neck without significant stenosis (50% or greater). Skeleton: No acute bony abnormality. Other neck: Multiple subcentimeter left thyroid lobe nodules. No soft tissue neck mass. Cervical lymph nodes are prominent in number, although not enlarged. Upper chest: No consolidation within the imaged lung apices. Coronary artery atherosclerotic disease. Review of the MIP images confirms the above findings CTA HEAD FINDINGS Anterior circulation: Bilateral ICA siphons patent without significant stenosis. Similar to prior CTA 07/20/2018 there is moderate/severe segmental stenosis of the proximal M1 right middle cerebral artery. Distal to this, no high-grade stenosis is demonstrated within the M1 or M2 middle cerebral artery branches. There is occlusion of the proximal M1 left middle cerebral artery shortly beyond its origin with multiple small surrounding collateral vessels. Findings are unchanged as compared to prior CTA. As before, there is faint reconstitution of the distal M1 and more distal left MCA branch vessels. The bilateral anterior cerebral arteries are patent without significant proximal stenosis. No intracranial aneurysm is identified. Posterior circulation: Intracranial vertebral arteries are patent without high-grade stenosis. Basilar artery is patent without high-grade stenosis. Bilateral posterior cerebral arteries patent without high-grade proximal stenosis. Venous sinuses: Within limitations of contrast timing, no convincing thrombus. Anatomic variants: None significant Review of the MIP images  confirms the above findings IMPRESSION: CT head: Unremarkable noncontrast CT appearance of the brain. No evidence of acute intracranial abnormality. CTA head: 1. Redemonstrated complete occlusion of the M1 left middle cerebral artery just beyond its origin. As before, there is faint distal reconstitution of the distal M1 and more distal left  MCA branches. There are numerous small surrounding collateral vessels and findings are suggestive of moyamoya phenomenon. 2. Unchanged moderate/severe segmental stenosis within the proximal M1 right MCA. CTA neck: 1. Atherosclerotic disease within the aortic arch and major branch vessels of the neck as described. 2. Common carotid, internal carotid and vertebral arteries patent within the neck bilaterally without significant stenosis (50% or greater). 3. Small ulcerated plaque laterally at the left carotid bifurcation. 4. Cervical lymph nodes are prominent in number but not enlarged. Clinical correlation recommended. 5. Coronary artery atherosclerosis. Electronically Signed   By: Kellie Simmering   On: 01/24/2019 14:17     Additional studies/ records that were reviewed today include:  I reviewed the records of Dr. Percival Spanish, imaging studies,Novasom sleep study from 2016/01/01 and obtained a download from his card in his old S9 Elite CPAP unit    ASSESSMENT:    1. OSA (obstructive sleep apnea)   2. Essential hypertension   3. Cerebrovascular disease   4. Hyperlipidemia with target LDL less than 70      PLAN:  Jesus Juarez is a 68 year old gentleman who has a history of hypertension, documented aortic atherosclerosis as well as a remote TIA.  [place] was [place] with obstructive sleep apnea in the early 1990s and has been on CPAP therapy for over 25 years.  His most recent sleep evaluation in August 2017 confirmed severe sleep apnea with an AHI of 51.9/h.  Apparently it seems that he may have received a new ResMed air sense 10 CPAP machine at that  time, but when he packed up for his move, he reverted back to using his old S9 Elite CPAP unit.  He brought this machine with him today and we were able to obtain a download from his card which reveals 100% compliance.  He is averaging 8 hours and 6 minutes of CPAP use per night.  At a 10 cm set pressure AHI remains mildly elevated at 6.3.  He has residual daytime sleepiness and his Epworth Sleepiness Scale score endorsed at 10.  I have recommended increasing his CPAP set pressure to 12 cm.  We will also contact his DME company and eating so that we can be linked to his ResMed air sense 10 CPAP unit which is wireless in which he has not been using.  We will then set that pressure at 12 cm as well.  He is now moved to [place].  If he prefers to have a DME company in [place] we will refer him locally so that he can get his supplies.  In 1 month after utilizing the new machine, we will obtain a download once he is linked to [place] to make certain his setting is appropriate and further adjustments will be made if necessary.  Blood pressure was improved we will recheck by me since he is seen Dr. Percival Spanish earlier today.  I discussed target blood pressure less than 130/80 with ideal blood pressure less than 120/80.  He is on pravastatin for his documented atherosclerosis.  He will follow-up with Dr. Percival Spanish.  I will see him in 1 year for reevaluation or sooner as needed.  Medication Adjustments/Labs and Tests Ordered: Current medicines are reviewed at length with the patient today.  Concerns regarding medicines are outlined above.  Medication changes, Labs and Tests ordered today are listed in the Patient Instructions below. Patient Instructions  Medication Instructions:  The current medical regimen is effective;  continue present plan and medications.  If you  need a refill on your cardiac medications before your next appointment, please call your pharmacy.   Follow-Up: At [facility], you and your health  needs are our priority.  As part of our continuing mission to provide you with exceptional heart care, we have created designated Provider Care Teams.  These Care Teams include your primary Cardiologist (physician) and Advanced Practice Providers (APPs -  Physician Assistants and Nurse Practitioners) who all work together to provide you with the care you need, when you need it. You will need a follow up appointment in 12 months (sleep).  Please call our office 2 months in advance to schedule this appointment.  You may see Dr.Thomas Claiborne Billings or one of the following Advanced Practice Providers on your designated Care Team: Almyra Deforest, [place] . Fabian Sharp, PA-C  Any Other Special Instructions Will Be Listed Below (If Applicable). We will do a download in 1 month- after we get it linked.       Signed, Shelva Majestic, MD  02/04/2019 9:06 AM    [facility] [address], [place], [place], [place]  [address] Phone: [phone]

## 2019-02-20 DIAGNOSIS — M79672 Pain in left foot: Secondary | ICD-10-CM | POA: Diagnosis not present

## 2019-02-20 DIAGNOSIS — M79671 Pain in right foot: Secondary | ICD-10-CM | POA: Diagnosis not present

## 2019-04-02 DIAGNOSIS — R69 Illness, unspecified: Secondary | ICD-10-CM | POA: Diagnosis not present

## 2019-04-02 DIAGNOSIS — G4733 Obstructive sleep apnea (adult) (pediatric): Secondary | ICD-10-CM | POA: Diagnosis not present

## 2019-04-04 DIAGNOSIS — H6123 Impacted cerumen, bilateral: Secondary | ICD-10-CM | POA: Diagnosis not present

## 2019-04-18 ENCOUNTER — Telehealth: Payer: Self-pay | Admitting: *Deleted

## 2019-04-18 NOTE — Telephone Encounter (Signed)
Advised patient, verbalized understanding.    Minus Breeding, MD  Stephani Police, RN 15 minutes ago (5:50 PM)   I would suggest that with these symptoms he either try to get in to see his PCP right away or go the the ED. I would not change his meds unless he has sustained hypotension. He has had BPs that fluctuate.   Message text   Stephani Police, RN routed conversation to You; C[facility] N[facility] Triage; Minus Breeding, MD 6 hours ago (11:40 AM)  Stephani Police, RN 6 hours ago (11:40 AM)     Pt called to report that since yesterday at 4pm he has been in bed with increased fatigue and weakness.. he says his extremities are very cold and had to use hand warmers to warm them up.Marland Kitchen   He says he has progressively been getting weak and tired but this is worse than his normal.. he is asking if his meds can be a factor.   His BP is 119/68 HR 70-80, he does not think he has a fever but I have asked him to check it when possible. He denies weakness or numbness on one side of his body.. no slurred speech.. he is a little dizzy.... he denies chest pain. No SOB. He has a mild headache in the front of his head "like a sinus headache". No other sinus symptoms.   Pt says he has been eating well no nausea or vomiting and no problems with urination.   I have asked him to check his BP twice a day for a few days and to let us know his readings for Dr. Rosezella Florida review since he is asking about cutting back on some meds.. he has had some increased bruising he attributes to his Plavix and wants Dr. Percival Spanish to know about.   I have advised him to talk with his PCP re: any other causes for his acute symptoms.   He will continue to monitor and to eat/ drink well. Will call if any worsening symptoms to suggest cardiac issues.   Will forward to Dr. Percival Spanish for review.        Documentation   Jessie Foot, RN 6 hours ago (11:30 AM)  Stephani Police, RN routed conversation to [employer] Triage 6 hours  ago (11:10 AM)  Stephani Police, RN  [name] 6 hours ago (11:10 AM)   Mr. Aurich, I am sorry to hear that you are not feeling well. I have tried calling you but left a message. Please feel free to call us at [phone] and ask for a triage nurse so we can further discuss your symptoms.  Ann RN    Gillian Scarce, Gerri Lins, MD 7 hours ago (10:58 AM)   I'm feeling very cold especially in my hands and feet. Very tired, can't think clearly, unsteady when I walk. I've been in bed since 4pm yesterday (Tuesday). Am I having side effects from meds? Please have a nurse call me.

## 2019-04-18 NOTE — Telephone Encounter (Signed)
Pt called to report that since yesterday at 4pm he has been in bed with increased fatigue and weakness.. he says his extremities are very cold and had to use hand warmers to warm them up.Marland Kitchen   He says he has progressively been getting weak and tired but this is worse than his normal.. he is asking if his meds can be a factor.   His BP is 119/68 HR 70-80, he does not think he has a fever but I have asked him to check it when possible. He denies weakness or numbness on one side of his body.. no slurred speech.. he is a little dizzy.... he denies chest pain. No SOB. He has a mild headache in the front of his head "like a sinus headache". No other sinus symptoms.   Pt says he has been eating well no nausea or vomiting and no problems with urination.   I have asked him to check his BP twice a day for a few days and to let us know his readings for Dr. Rosezella Florida review since he is asking about cutting back on some meds.. he has had some increased bruising he attributes to his Plavix and wants Dr. Percival Spanish to know about.   I have advised him to talk with his PCP re: any other causes for his acute symptoms.   He will continue to monitor and to eat/ drink well. Will call if any worsening symptoms to suggest cardiac issues.   Will forward to Dr. Percival Spanish for review.

## 2019-05-02 ENCOUNTER — Encounter

## 2019-05-09 ENCOUNTER — Other Ambulatory Visit: Payer: Self-pay | Admitting: Neurology

## 2019-05-10 ENCOUNTER — Telehealth: Payer: Self-pay | Admitting: Neurology

## 2019-05-10 ENCOUNTER — Encounter: Payer: Self-pay | Admitting: Neurology

## 2019-05-10 NOTE — Telephone Encounter (Signed)
Agree with what has been advised.  These are new complaints and I have not seen him since March, so he will need to set up a VV to discuss.

## 2019-05-10 NOTE — Telephone Encounter (Signed)
Patient called requesting a call back from a nurse. He said he's been feeling "woozy" for about three weeks now. Patient stated his cardiology doctor suggested he reach out to Dr. Posey Pronto for advice.

## 2019-05-10 NOTE — Telephone Encounter (Signed)
Pt has an appointment for tomorrow

## 2019-05-10 NOTE — Telephone Encounter (Signed)
Called and spoke with pt he stated that when he stands up to fast or makes sudden moves he gets dizzy. Pt stated he talked to cardio and they told him to contact neuro. Pt stated he has had increase in being tired. Pt asked If he has a way to check his BP he said he does I asked pt to check his BP sitting and then standing he stated he would and would let us know what it was. I asked PT if he has been drinking enough water he stated he thinks he may have been but not really sure, he asked if being dehydrated could make you feel that way, pt informed that dehydration could make you feel dizzy. Pt advised to drink fluids and be careful when standing.

## 2019-05-11 ENCOUNTER — Other Ambulatory Visit: Payer: Self-pay

## 2019-05-11 ENCOUNTER — Telehealth (INDEPENDENT_AMBULATORY_CARE_PROVIDER_SITE_OTHER): Payer: Medicare HMO | Admitting: Neurology

## 2019-05-11 VITALS — BP 136/74 | HR 71 | Ht 71.0 in | Wt 220.0 lb

## 2019-05-11 DIAGNOSIS — I951 Orthostatic hypotension: Secondary | ICD-10-CM | POA: Diagnosis not present

## 2019-05-11 DIAGNOSIS — I679 Cerebrovascular disease, unspecified: Secondary | ICD-10-CM

## 2019-05-11 NOTE — Progress Notes (Signed)
   Virtual Visit via Video Note The purpose of this virtual visit is to provide medical care while limiting exposure to the novel coronavirus.    Consent was obtained for video visit:  Yes.   Answered questions that patient had about telehealth interaction:  Yes.   I discussed the limitations, risks, security and privacy concerns of performing an evaluation and management service by telemedicine. I also discussed with the patient that there may be a patient responsible charge related to this service. The patient expressed understanding and agreed to proceed.  Pt location: Home Physician Location: office Name of referring provider:  Rory Percy, MD I connected with [name] at patients initiation/request on 05/11/2019 at 12:50 PM EST by video enabled telemedicine application and verified that I am speaking with the correct person using two identifiers. Pt MRN:  [id number] Pt DOB:  31-Jan-1951 Video Participants:  [name]   History of Present Illness: This is a 69 y.o. male previously seen for TIA in the setting of left MCA stenosis returning with new complaint of lightheadedness.  Over the past three weeks, he has been having spells of lightheadedness.  He has noticed that it usually occurs about 1 hour after taking his morning medications and is worse when he is changing positions or standing for prolonged period of time, such as when washing dishes.  He does not have any associated headache, vision changes, numbness/tingling or weakness.  He often tries to lay down which helps.  He takes amlodipine 5mg  and chlorthalidone 25mg  daily for high blood pressure.     Observations/Objective:   Vitals:   05/10/19 1345  BP: 136/74  Pulse: 71  Weight: 220 lb (99.8 kg)  Height: 5\' 11"  (1.803 m)   BP check at home with instructions to check sitting and standing: Sitting   BP 142/90  HR 81 Standing BP 122/83 HR 92  Patient is awake, alert, and appears comfortable.      Face is  symmetric.  Speech is not dysarthric. Antigravity in all extremities.  Gait not tested  Assessment and Plan:  1.  Orthostatic hypotension causing positional lightheadedness.  No associated neurological symptoms.  Patient reassured that I do not see any evidence of primary neurological condition for this.  With his 20-point drop in SBP from sitting to standing, this is most consistent with orthostatic hypotension. He takes amlodipine 5mg  and chlorthalidone 25mg  daily for hypertension.  I have asked him to follow-up with Dr. Percival Spanish who may want to adjust his BP medications In the meantime, I have encouraged him to stay well-hydrated and make positional changes slowly.   2.  History of TIA manifesting with right hemisensory symptoms (06/2018) in the setting of left MCA stenosis.  Appreciate Dr. Arlean Hopping evaluation.  Continue aspirin 81mg , plavix 75mg , and statin therapy   Follow Up Instructions:   I discussed the assessment and treatment plan with the patient. The patient was provided an opportunity to ask questions and all were answered. The patient agreed with the plan and demonstrated an understanding of the instructions.   The patient was advised to call back or seek an in-person evaluation if the symptoms worsen or if the condition fails to improve as anticipated.  Follow-up in 8 months, or sooner as needed   Alda Berthold, DO

## 2019-05-23 ENCOUNTER — Encounter: Payer: Self-pay | Admitting: *Deleted

## 2019-05-23 ENCOUNTER — Telehealth: Payer: Self-pay | Admitting: *Deleted

## 2019-05-23 NOTE — Telephone Encounter (Signed)
Patient called back to scheduled. When called went to voicemail. Left message and sent mychart message with date and time of appointment, to call back if does not work for him.

## 2019-05-23 NOTE — Telephone Encounter (Signed)
-----   Message from Minus Breeding, MD sent at 05/13/2019  9:52 PM EST ----- Needs follow up with me.   In office ----- Message ----- From: Alda Berthold, DO Sent: 05/11/2019   9:16 PM EST To: Minus Breeding, MD  Dr. Percival Spanish, I hope you and your family are well.  I had a virtual visit with Mr. Cranson, a mutual patient, for lightheadedness which seems to be consistent with orthostatic hypotension.  I do not see any neurological cause for symptoms.  I have asked him to follow-up with you to see if any adjustment needs to be made to his blood pressure medications.   Appreciate your help in his care. Donika

## 2019-05-23 NOTE — Telephone Encounter (Signed)
Tried to call patient, unable to leave message. Mychart message sent requesting he call office to schedule visit.

## 2019-05-24 ENCOUNTER — Encounter: Payer: Self-pay | Admitting: Cardiology

## 2019-05-24 DIAGNOSIS — H02831 Dermatochalasis of right upper eyelid: Secondary | ICD-10-CM | POA: Diagnosis not present

## 2019-05-24 DIAGNOSIS — H2513 Age-related nuclear cataract, bilateral: Secondary | ICD-10-CM | POA: Diagnosis not present

## 2019-05-24 DIAGNOSIS — Z7189 Other specified counseling: Secondary | ICD-10-CM | POA: Insufficient documentation

## 2019-05-24 DIAGNOSIS — H40033 Anatomical narrow angle, bilateral: Secondary | ICD-10-CM | POA: Diagnosis not present

## 2019-05-24 DIAGNOSIS — H02834 Dermatochalasis of left upper eyelid: Secondary | ICD-10-CM | POA: Diagnosis not present

## 2019-05-24 NOTE — Progress Notes (Signed)
Cardiology Office Note   Date:  05/25/2019   ID:  Jesus Juarez, DOB 11/27/1950, MRN [id number]  PCP:  Rory Percy, MD  Cardiologist:   Minus Breeding, MD   Chief Complaint  Patient presents with  . Dizziness      History of Present Illness: Jesus Juarez is a 69 y.o. male who presents for evaluation of hypertension.  He has a history of a normal cath 20 yrs ago. His last nuclear was in 2017 with low risk study. He had palpitations in 10/2017 and an event monitor with SR/ST. In ER in Feb he had hand numbness other symptoms suggestive of a possible TIA.  CT head was normal and MRI of brian without acute abnormality.     He has seen a neurologist.  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   I saw him virtually for this.  He had an angiogram.    The L MCA had complete occlusion.   He had a 75% stenosis of the R MCA prox M1 segment as well.     Since I last saw him Dr. Posey Pronto sent me a message saying that she had seen the patient and thought that he had dizziness possibly related to orthostatic hypotension.  He does feel dizzy when he stands up from a seated position.  Occasionally he feels lightheaded or foggy when he is just sitting there.  He has not had any frank syncope.  He says his blood pressure is typically in the 120s over 70s when he checks it.  He does have fatigue.  He does not have the energy to do things like he used to.  He got his CPAP adjusted recently.   Past Medical History:  Diagnosis Date  . History of cardiac catheterization    Normal coronaries - approximately 20 years ago  . Hypertension   . Obstructive sleep apnea   . Vitamin D deficiency     Past Surgical History:  Procedure Laterality Date  . IR ANGIO INTRA EXTRACRAN SEL COM CAROTID INNOMINATE BILAT MOD SED  10/03/2018  . IR ANGIO VERTEBRAL SEL SUBCLAVIAN INNOMINATE UNI L MOD SED  10/03/2018  . IR ANGIO VERTEBRAL SEL VERTEBRAL UNI R MOD SED  10/03/2018   . IR US GUIDE VASC ACCESS RIGHT  10/03/2018     Current Outpatient Medications  Medication Sig Dispense Refill  . amLODipine (NORVASC) 5 MG tablet Take 5 mg by mouth as directed.     Marland Kitchen aspirin EC 81 MG tablet Take 81 mg by mouth daily.    . B Complex Vitamins (VITAMIN-B COMPLEX PO) Take 1 tablet by mouth daily.     . cholecalciferol (VITAMIN D) 1000 units tablet Take 1,000 Units by mouth 2 (two) times daily.    . clopidogrel (PLAVIX) 75 MG tablet TAKE 1 TABLET BY MOUTH EVERY DAY 90 tablet 1  . Multiple Vitamin (MULTIVITAMIN) tablet Take 1 tablet by mouth daily.    . pravastatin (PRAVACHOL) 40 MG tablet Take 1 tablet (40 mg total) by mouth daily. 90 tablet 3  . vitamin C (ASCORBIC ACID) 500 MG tablet Take 500 mg by mouth daily.     No current facility-administered medications for this visit.    Allergies:   Penicillins and Sulfur    ROS:  Please see the history of present illness.   Otherwise, review of systems are positive for none .   All other systems are  reviewed and negative.    PHYSICAL EXAM: VS:  BP 134/70 (BP Location: Left Arm, Patient Position: Sitting, Cuff Size: Normal)   Pulse 88   Temp (!) 96.9 F (36.1 C)   Ht 5' 11.5" (1.816 m)   Wt 230 lb (104.3 kg)   BMI 31.63 kg/m  , BMI Body mass index is 31.63 kg/m. GENERAL:  Well appearing NECK:  No jugular venous distention, waveform within normal limits, carotid upstroke brisk and symmetric, no bruits, no thyromegaly LUNGS:  Clear to auscultation bilaterally CHEST:  Unremarkable HEART:  PMI not displaced or sustained,S1 and S2 within normal limits, no S3, no S4, no clicks, no rubs, very brief systolic murmur, no diastolic murmurs ABD:  Flat, positive bowel sounds normal in frequency in pitch, no bruits, no rebound, no guarding, no midline pulsatile mass, no hepatomegaly, no splenomegaly EXT:  2 plus pulses throughout, no edema, no cyanosis no clubbing   EKG:  EKG is not   ordered today.   Recent Labs: 06/12/2018:  ALT 26 07/06/2018: TSH 2.62 10/03/2018: BUN 18; Hemoglobin 14.1; Platelets 220; Potassium 3.8; Sodium 139 01/24/2019: Creatinine, Ser 1.00    Lipid Panel    Component Value Date/Time   CHOL 188 07/06/2018 0729   TRIG 216.0 (H) 07/06/2018 0729   HDL 47.00 07/06/2018 0729   CHOLHDL 4 07/06/2018 0729   VLDL 43.2 (H) 07/06/2018 0729   LDLDIRECT 123.0 07/06/2018 0729      Wt Readings from Last 3 Encounters:  05/25/19 230 lb (104.3 kg)  05/10/19 220 lb (99.8 kg)  02/01/19 225 lb (102.1 kg)      Other studies Reviewed: Additional studies/ records that were reviewed today include: Neuro records, CPAP report Review of the above records demonstrates:  See elsewhere  ASSESSMENT AND PLAN:   DIZZINESS:   I wonder if this could be related to his medications so I am going to get rid of the chlorthalidone.  He will let me know how his dizziness is.  However, his blood pressure might go up and this is addressed below.  HTN:  We talked specifically about weight loss and exercise to combat his blood pressure as on reducing his medications.   NEUROVASCULAR DISEASE: This is followed by neurology.  DYSLIPIDEMIA:    His goal LDL is less than 70.  His LDL is 63.  This was in May.  No change in therapy.   SLEEP APNEA:   He uses CPAP.  I brought our sleep nurse over to talk to him.  He is 100% compliant.  However, his AHI is 5.8 and he might need to change to his settings.  We will discuss this with Dr. Claiborne Billings on Monday.   FATIGUE:  I am hoping that he will feel less tired as we get his CPAP adjusted.  CHEST PAIN:    He has had no new symptoms since his perfusion study in 2017.  He will continue with risk reduction.  COVID EDUCATION:  We talked about the vaccine and I answered some questions.      Current medicines are reviewed at length with the patient today.  The patient does not have concerns regarding medicines.  The following changes have been made:  None  Labs/ tests ordered today  include:  None No orders of the defined types were placed in this encounter.    Disposition:   FU with me in 6  months.      Signed, Minus Breeding, MD  05/25/2019 12:52 PM  [facility]

## 2019-05-25 ENCOUNTER — Ambulatory Visit (INDEPENDENT_AMBULATORY_CARE_PROVIDER_SITE_OTHER): Payer: Medicare HMO | Admitting: Cardiology

## 2019-05-25 ENCOUNTER — Other Ambulatory Visit: Payer: Self-pay

## 2019-05-25 ENCOUNTER — Encounter: Payer: Self-pay | Admitting: Cardiology

## 2019-05-25 VITALS — BP 134/70 | HR 88 | Temp 96.9°F | Ht 71.5 in | Wt 230.0 lb

## 2019-05-25 DIAGNOSIS — Z7189 Other specified counseling: Secondary | ICD-10-CM

## 2019-05-25 DIAGNOSIS — G4730 Sleep apnea, unspecified: Secondary | ICD-10-CM | POA: Diagnosis not present

## 2019-05-25 DIAGNOSIS — R5383 Other fatigue: Secondary | ICD-10-CM | POA: Diagnosis not present

## 2019-05-25 DIAGNOSIS — E785 Hyperlipidemia, unspecified: Secondary | ICD-10-CM | POA: Diagnosis not present

## 2019-05-25 DIAGNOSIS — R072 Precordial pain: Secondary | ICD-10-CM

## 2019-05-25 NOTE — Patient Instructions (Signed)
Medication Instructions:  [place] *If you need a refill on your cardiac medications before your next appointment, please call your pharmacy*  Lab Work: NONE  Testing/Procedures: NONE  Follow-Up: At [facility], you and your health needs are our priority.  As part of our continuing mission to provide you with exceptional heart care, we have created designated Provider Care Teams.  These Care Teams include your primary Cardiologist (physician) and Advanced Practice Providers (APPs -  Physician Assistants and Nurse Practitioners) who all work together to provide you with the care you need, when you need it.  Your next appointment:   4 month(s)  You will receive a reminder letter in the mail two months in advance. If you don't receive a letter, please call our office to schedule the follow-up appointment.   The format for your next appointment:   In Person  Provider:   Minus Breeding, MD

## 2019-05-28 DIAGNOSIS — L821 Other seborrheic keratosis: Secondary | ICD-10-CM | POA: Diagnosis not present

## 2019-05-28 DIAGNOSIS — D229 Melanocytic nevi, unspecified: Secondary | ICD-10-CM | POA: Diagnosis not present

## 2019-05-28 DIAGNOSIS — L570 Actinic keratosis: Secondary | ICD-10-CM | POA: Diagnosis not present

## 2019-05-28 DIAGNOSIS — D1801 Hemangioma of skin and subcutaneous tissue: Secondary | ICD-10-CM | POA: Diagnosis not present

## 2019-05-28 DIAGNOSIS — R69 Illness, unspecified: Secondary | ICD-10-CM | POA: Diagnosis not present

## 2019-05-28 DIAGNOSIS — L819 Disorder of pigmentation, unspecified: Secondary | ICD-10-CM | POA: Diagnosis not present

## 2019-05-28 DIAGNOSIS — L814 Other melanin hyperpigmentation: Secondary | ICD-10-CM | POA: Diagnosis not present

## 2019-06-14 ENCOUNTER — Other Ambulatory Visit: Payer: Self-pay

## 2019-06-14 MED ORDER — CHLORTHALIDONE 25 MG PO TABS: 25.0000 mg | ORAL_TABLET | Freq: Every day | ORAL | 3 refills | Status: DC

## 2019-06-14 NOTE — Progress Notes (Signed)
Restart Chlorthalidone 25mg  daily per Dr. Percival Spanish.

## 2019-06-15 DIAGNOSIS — Z0100 Encounter for examination of eyes and vision without abnormal findings: Secondary | ICD-10-CM | POA: Diagnosis not present

## 2019-06-18 DIAGNOSIS — L570 Actinic keratosis: Secondary | ICD-10-CM | POA: Diagnosis not present

## 2019-06-18 DIAGNOSIS — L819 Disorder of pigmentation, unspecified: Secondary | ICD-10-CM | POA: Diagnosis not present

## 2019-08-06 ENCOUNTER — Ambulatory Visit: Payer: Medicare HMO | Admitting: Cardiology

## 2019-08-08 ENCOUNTER — Telehealth (HOSPITAL_COMMUNITY): Payer: Self-pay

## 2019-08-08 DIAGNOSIS — H6123 Impacted cerumen, bilateral: Secondary | ICD-10-CM | POA: Diagnosis not present

## 2019-08-08 NOTE — Telephone Encounter (Signed)
Called to schedule cta head/neck, no answer, vm full. AW  

## 2019-09-03 ENCOUNTER — Telehealth (HOSPITAL_COMMUNITY): Payer: Self-pay

## 2019-09-03 ENCOUNTER — Other Ambulatory Visit (HOSPITAL_COMMUNITY): Payer: Self-pay | Admitting: Interventional Radiology

## 2019-09-03 DIAGNOSIS — I771 Stricture of artery: Secondary | ICD-10-CM

## 2019-09-03 NOTE — Telephone Encounter (Signed)
Called to schedule f/u cta, no answer, left vm. AW  

## 2019-09-04 DIAGNOSIS — R69 Illness, unspecified: Secondary | ICD-10-CM | POA: Diagnosis not present

## 2019-09-06 ENCOUNTER — Telehealth: Payer: Self-pay | Admitting: *Deleted

## 2019-09-06 NOTE — Telephone Encounter (Signed)
PRIMARY CARDIOLOGIST      [facility] Pre-operative Risk Assessment    Request for surgical clearance:  1. What type of surgery is being performed? ** EXTRACT TOOTH #16  2. When is this surgery scheduled? TBD   3. What type of clearance is required (medical clearance vs. Pharmacy clearance to hold med vs. Both)?  MEDICAL   4. Are there any medications that need to be held prior to surgery and how long? ASPIRIN  81 MG , PLAVIX 75 MG    5. Practice name and name of physician performing surgery?  [facility] ; DR Jacqulynn Cadet DDS,[place]  6. What is your office phone number [phone]  [phone]   7.   What is your office fax number  [phone]   8.   Anesthesia type (None, local, MAC, general) ?  unknown   Jesus Juarez 09/06/2019, 3:02 PM  _________________________________________________________________   (provider comments below)

## 2019-09-07 ENCOUNTER — Telehealth: Payer: Self-pay | Admitting: Cardiology

## 2019-09-07 NOTE — Telephone Encounter (Signed)
Patient returning call.

## 2019-09-07 NOTE — Telephone Encounter (Signed)
Error

## 2019-09-07 NOTE — Telephone Encounter (Signed)
   Primary Cardiologist: Minus Breeding, MD  Chart reviewed as part of pre-operative protocol coverage. Simple dental extractions are considered low risk procedures per guidelines and generally do not require any specific cardiac clearance. It is also generally accepted that for simple extractions and dental cleanings, there is no need to interrupt blood thinner therapy.   SBE prophylaxis is not required for the patient.  I will route this recommendation to the requesting party via Epic fax function and remove from pre-op pool.  Please call with questions.  [place], PA 09/07/2019, 10:58 AM

## 2019-09-20 ENCOUNTER — Ambulatory Visit (HOSPITAL_COMMUNITY)
Admission: RE | Admit: 2019-09-20 | Discharge: 2019-09-20 | Disposition: A | Discharge status: Home / Self Care | Payer: Medicare HMO | Source: Ambulatory Visit | Attending: Interventional Radiology | Admitting: Interventional Radiology

## 2019-09-20 DIAGNOSIS — I6523 Occlusion and stenosis of bilateral carotid arteries: Secondary | ICD-10-CM | POA: Diagnosis not present

## 2019-09-20 DIAGNOSIS — I771 Stricture of artery: Secondary | ICD-10-CM | POA: Diagnosis not present

## 2019-09-20 LAB — POCT I-STAT CREATININE: Creatinine, Ser: 1.1 mg/dL (ref 0.61–1.24)

## 2019-09-20 MED ORDER — IOHEXOL 350 MG/ML SOLN
100.0000 mL | Freq: Once | INTRAVENOUS | Status: AC | PRN
  Administered 2019-09-20: 100 mL via INTRAVENOUS

## 2019-09-22 DIAGNOSIS — R42 Dizziness and giddiness: Secondary | ICD-10-CM | POA: Insufficient documentation

## 2019-09-22 DIAGNOSIS — R5383 Other fatigue: Secondary | ICD-10-CM | POA: Insufficient documentation

## 2019-09-22 NOTE — Progress Notes (Signed)
Cardiology Office Note   Date:  09/24/2019   ID:  MARGARITO BONIFAY, DOB [birth year], MRN [id number]  PCP:  Rory Percy, MD  Cardiologist:   Minus Breeding, MD   Chief Complaint  Patient presents with  . Dizziness      History of Present Illness: Jesus Juarez is a 69 y.o. male who presents for evaluation of hypertension.  He has a history of a normal cath 20 yrs ago. His last nuclear was in 2017 with low risk study. He had palpitations in 10/2017 and an event monitor with SR/ST. In ER in Feb 2020 he had hand numbness other symptoms suggestive of a possible TIA.  CT head was normal and MRI of brian without acute abnormality.     He has seen a neurologist.  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   He had an angiogram.    The L MCA had complete occlusion.   He had a 75% stenosis of the R MCA prox M1 segment as well.   He did have a CT the other day and I did review this.  It was to follow-up and it was stable.  At the last visit he had dizziness and apparent orthostatic hypotension so I stopped chlorthalidone.  However, his blood pressure went up and we had to restart the chlorthalidone.  He does get dizzy if he moves too quickly from a lying to a standing position.  He might have some dizziness when he looks up.  He does have follow-up with neurology.  He is not having any new chest pressure, neck or arm discomfort.  He does have some fatigue on some days.  He tries to walk an hour a day.  Some days she is too fatigued to do this.  But is not being limited by chest pressure, neck or arm discomfort.  He has had no new shortness of breath, PND or orthopnea.  He uses CPAP though he wonders whether it is optimized and working as good as it could.  Past Medical History:  Diagnosis Date  . History of cardiac catheterization    Normal coronaries - approximately 20 years ago  . Hypertension   . Obstructive sleep apnea   . Vitamin D  deficiency     Past Surgical History:  Procedure Laterality Date  . IR ANGIO INTRA EXTRACRAN SEL COM CAROTID INNOMINATE BILAT MOD SED  10/03/2018  . IR ANGIO VERTEBRAL SEL SUBCLAVIAN INNOMINATE UNI L MOD SED  10/03/2018  . IR ANGIO VERTEBRAL SEL VERTEBRAL UNI R MOD SED  10/03/2018  . IR US GUIDE VASC ACCESS RIGHT  10/03/2018     Current Outpatient Medications  Medication Sig Dispense Refill  . amLODipine (NORVASC) 5 MG tablet Take 5 mg by mouth as directed.     Marland Kitchen aspirin EC 81 MG tablet Take 81 mg by mouth daily.    . B Complex Vitamins (VITAMIN-B COMPLEX PO) Take 1 tablet by mouth daily.     . chlorthalidone (HYGROTON) 25 MG tablet Take 1 tablet (25 mg total) by mouth daily. 90 tablet 3  . cholecalciferol (VITAMIN D) 1000 units tablet Take 1,000 Units by mouth 2 (two) times daily.    . clopidogrel (PLAVIX) 75 MG tablet TAKE 1 TABLET BY MOUTH EVERY DAY 90 tablet 1  . Multiple Vitamin (MULTIVITAMIN) tablet Take 1 tablet by mouth daily.    . pravastatin (PRAVACHOL) 40 MG tablet Take 1  tablet (40 mg total) by mouth daily. 90 tablet 3  . vitamin C (ASCORBIC ACID) 500 MG tablet Take 500 mg by mouth daily.     No current facility-administered medications for this visit.    Allergies:   Penicillins and Sulfur    ROS:  Please see the history of present illness.   Otherwise, review of systems are positive for none.   All other systems are reviewed and negative.    PHYSICAL EXAM: VS:  BP 132/80   Pulse 73   Ht 5\' 11"  (1.803 m)   Wt 225 lb (102.1 kg)   SpO2 98%   BMI 31.38 kg/m  , BMI Body mass index is 31.38 kg/m. GENERAL:  Well appearing NECK:  No jugular venous distention, waveform within normal limits, carotid upstroke brisk and symmetric, no bruits, no thyromegaly LUNGS:  Clear to auscultation bilaterally CHEST:  Unremarkable HEART:  PMI not displaced or sustained,S1 and S2 within normal limits, no S3, no S4, no clicks, no rubs, very brief apical systolic murmur nonradiating, no  diastolic murmurs ABD:  Flat, positive bowel sounds normal in frequency in pitch, no bruits, no rebound, no guarding, no midline pulsatile mass, no hepatomegaly, no splenomegaly EXT:  2 plus pulses throughout, no edema, no cyanosis no clubbing   EKG:  EKG is ordered today. Sinus rhythm, rate 70, axis within normal limits, RSR prime in V1, early transition lead V2, no acute ST-T wave changes.  Recent Labs: 10/03/2018: BUN 18; Hemoglobin 14.1; Platelets 220; Potassium 3.8; Sodium 139 09/20/2019: Creatinine, Ser 1.10    Lipid Panel    Component Value Date/Time   CHOL 188 07/06/2018 0729   TRIG 216.0 (H) 07/06/2018 0729   HDL 47.00 07/06/2018 0729   CHOLHDL 4 07/06/2018 0729   VLDL 43.2 (H) 07/06/2018 0729   LDLDIRECT 123.0 07/06/2018 0729      Wt Readings from Last 3 Encounters:  09/24/19 225 lb (102.1 kg)  05/25/19 230 lb (104.3 kg)  05/10/19 220 lb (99.8 kg)      Other studies Reviewed: Additional studies/ records that were reviewed today include: CT Review of the above records demonstrates:  Labs  ASSESSMENT AND PLAN:   DIZZINESS:     This is mild.  There is some orthostasis.  We are going to manage this conservatively.  HTN:  I would not want his blood pressure to be above where it is now.  He understands this as the target.  He will continue on the meds as listed.  NEUROVASCULAR DISEASE: He is going to pursue aggressive risk reduction.  He will follow up with neurology in the fall.   DYSLIPIDEMIA:    His goal LDL is less than 70.  He is going to get this repeated by his primary provider.  He will remain on the meds as listed.   SLEEP APNEA:   I sent a message to our group to see if his device has been optimized.  THYROID NODULES: I do note that he had these on his CT recently.  I am going to take the liberty of ordering a thyroid ultrasound.  CHEST PAIN:   He is not having chest pain.  He had a negative perfusion study a few years ago and has no new symptoms since  then.  We are pursuing aggressive risk reduction.  COVID EDUCATION: We talked at length about the vaccine and I think he will get this.  His questions were answered.  Current medicines are reviewed at length with  the patient today.  The patient does not have concerns regarding medicines.  The following changes have been made:  None  Labs/ tests ordered today include:    Orders Placed This Encounter  Procedures  . US Soft Tissue Head/Neck  . EKG 12-Lead     Disposition:   FU with me in 12 months.      Signed, Minus Breeding, MD  09/24/2019 11:06 AM    [facility]

## 2019-09-24 ENCOUNTER — Other Ambulatory Visit: Payer: Self-pay

## 2019-09-24 ENCOUNTER — Telehealth (HOSPITAL_COMMUNITY): Payer: Self-pay

## 2019-09-24 ENCOUNTER — Ambulatory Visit: Payer: Medicare HMO | Admitting: Cardiology

## 2019-09-24 ENCOUNTER — Encounter: Payer: Self-pay | Admitting: Cardiology

## 2019-09-24 VITALS — BP 132/80 | HR 73 | Ht 71.0 in | Wt 225.0 lb

## 2019-09-24 DIAGNOSIS — R5383 Other fatigue: Secondary | ICD-10-CM | POA: Diagnosis not present

## 2019-09-24 DIAGNOSIS — R42 Dizziness and giddiness: Secondary | ICD-10-CM | POA: Diagnosis not present

## 2019-09-24 DIAGNOSIS — E785 Hyperlipidemia, unspecified: Secondary | ICD-10-CM

## 2019-09-24 DIAGNOSIS — Z7189 Other specified counseling: Secondary | ICD-10-CM | POA: Diagnosis not present

## 2019-09-24 DIAGNOSIS — R072 Precordial pain: Secondary | ICD-10-CM

## 2019-09-24 DIAGNOSIS — I10 Essential (primary) hypertension: Secondary | ICD-10-CM | POA: Diagnosis not present

## 2019-09-24 DIAGNOSIS — G4730 Sleep apnea, unspecified: Secondary | ICD-10-CM

## 2019-09-24 DIAGNOSIS — E041 Nontoxic single thyroid nodule: Secondary | ICD-10-CM | POA: Diagnosis not present

## 2019-09-24 NOTE — Patient Instructions (Signed)
Medication Instructions:  NO CHANGES *If you need a refill on your cardiac medications before your next appointment, please call your pharmacy*  Lab Work: NONE ORDERED THIS VISIT  Testing/Procedures: ULTRASOUND OF SOFT TISSUE / NECK  Follow-Up: At [facility], you and your health needs are our priority.  As part of our continuing mission to provide you with exceptional heart care, we have created designated Provider Care Teams.  These Care Teams include your primary Cardiologist (physician) and Advanced Practice Providers (APPs -  Physician Assistants and Nurse Practitioners) who all work together to provide you with the care you need, when you need it.  Your next appointment:   12 month(s)  You will receive a reminder letter in the mail two months in advance. If you don't receive a letter, please call our office to schedule the follow-up appointment.  The format for your next appointment:   In Person  Provider:   Minus Breeding, MD

## 2019-09-24 NOTE — Telephone Encounter (Signed)
Pt agreed to f/u in 6 months with cta head/neck. AW 

## 2019-10-03 ENCOUNTER — Other Ambulatory Visit: Payer: Self-pay

## 2019-10-03 ENCOUNTER — Ambulatory Visit (HOSPITAL_COMMUNITY)
Admission: RE | Admit: 2019-10-03 | Discharge: 2019-10-03 | Disposition: A | Discharge status: Home / Self Care | Payer: Medicare HMO | Source: Ambulatory Visit | Attending: Cardiology | Admitting: Cardiology

## 2019-10-03 DIAGNOSIS — E041 Nontoxic single thyroid nodule: Secondary | ICD-10-CM | POA: Diagnosis not present

## 2019-10-10 DIAGNOSIS — R69 Illness, unspecified: Secondary | ICD-10-CM | POA: Diagnosis not present

## 2019-10-17 ENCOUNTER — Telehealth: Payer: Self-pay | Admitting: *Deleted

## 2019-10-17 NOTE — Telephone Encounter (Signed)
-----   Message from Freada Bergeron, [place] sent at 10/12/2019  6:48 PM EDT -----  ----- Message ----- From: Minus Breeding, MD Sent: 09/24/2019  10:29 AM EDT To: Lauralee Evener, CMA  Is up to date with all of his CPAP monitoring.  He has questions about whether it is optimized.

## 2019-10-17 NOTE — Telephone Encounter (Signed)
[facility] has patients download, I will call them on Thursday 10/18/19.

## 2019-10-18 ENCOUNTER — Other Ambulatory Visit: Payer: Self-pay | Admitting: Cardiology

## 2019-10-18 ENCOUNTER — Other Ambulatory Visit: Payer: Self-pay

## 2019-10-18 MED ORDER — CLOPIDOGREL BISULFATE 75 MG PO TABS: 75.0000 mg | ORAL_TABLET | Freq: Every day | ORAL | 1 refills | Status: DC

## 2019-10-18 NOTE — Telephone Encounter (Signed)
Disp Refills Start End   amLODipine (NORVASC) 5 MG tablet 90 tablet 3 10/18/2019    Sig: TAKE 1 TABLET BY MOUTH EVERY DAY   Sent to pharmacy as: amLODipine (NORVASC) 5 MG tablet   E-Prescribing Status: Receipt confirmed by pharmacy (10/18/2019 3:07 PM EDT)   Pharmacy  [facility], [place]

## 2019-10-22 NOTE — Telephone Encounter (Signed)
Rhyan says patients d/l looks great, patient notified.

## 2019-10-25 ENCOUNTER — Telehealth: Payer: Self-pay

## 2019-10-25 NOTE — Telephone Encounter (Signed)
Pt is aware and agreeable to report

## 2019-10-25 NOTE — Telephone Encounter (Signed)
-----   Message from Sueanne Margarita, MD sent at 10/23/2019  4:11 PM EDT ----- Good AHI and compliance.  Continue current PAP settings.

## 2019-11-08 DIAGNOSIS — G4730 Sleep apnea, unspecified: Secondary | ICD-10-CM | POA: Diagnosis not present

## 2019-11-08 DIAGNOSIS — R002 Palpitations: Secondary | ICD-10-CM | POA: Diagnosis not present

## 2019-11-08 DIAGNOSIS — K219 Gastro-esophageal reflux disease without esophagitis: Secondary | ICD-10-CM | POA: Diagnosis not present

## 2019-11-08 DIAGNOSIS — N400 Enlarged prostate without lower urinary tract symptoms: Secondary | ICD-10-CM | POA: Diagnosis not present

## 2019-11-08 DIAGNOSIS — I10 Essential (primary) hypertension: Secondary | ICD-10-CM | POA: Diagnosis not present

## 2019-11-08 DIAGNOSIS — E559 Vitamin D deficiency, unspecified: Secondary | ICD-10-CM | POA: Diagnosis not present

## 2019-11-08 DIAGNOSIS — R5383 Other fatigue: Secondary | ICD-10-CM | POA: Diagnosis not present

## 2019-11-12 DIAGNOSIS — Z6830 Body mass index (BMI) 30.0-30.9, adult: Secondary | ICD-10-CM | POA: Diagnosis not present

## 2019-11-12 DIAGNOSIS — Z0001 Encounter for general adult medical examination with abnormal findings: Secondary | ICD-10-CM | POA: Diagnosis not present

## 2019-11-12 DIAGNOSIS — I10 Essential (primary) hypertension: Secondary | ICD-10-CM | POA: Diagnosis not present

## 2019-11-12 DIAGNOSIS — G4730 Sleep apnea, unspecified: Secondary | ICD-10-CM | POA: Diagnosis not present

## 2019-11-14 DIAGNOSIS — G4733 Obstructive sleep apnea (adult) (pediatric): Secondary | ICD-10-CM | POA: Diagnosis not present

## 2019-12-13 DIAGNOSIS — R69 Illness, unspecified: Secondary | ICD-10-CM | POA: Diagnosis not present

## 2020-01-08 ENCOUNTER — Other Ambulatory Visit: Payer: Self-pay

## 2020-01-08 MED ORDER — CHLORTHALIDONE 25 MG PO TABS: 25.0000 mg | ORAL_TABLET | Freq: Every day | ORAL | 3 refills | Status: DC

## 2020-01-10 DIAGNOSIS — Z6830 Body mass index (BMI) 30.0-30.9, adult: Secondary | ICD-10-CM | POA: Diagnosis not present

## 2020-01-10 DIAGNOSIS — Z803 Family history of malignant neoplasm of breast: Secondary | ICD-10-CM | POA: Diagnosis not present

## 2020-01-10 DIAGNOSIS — Z7722 Contact with and (suspected) exposure to environmental tobacco smoke (acute) (chronic): Secondary | ICD-10-CM | POA: Diagnosis not present

## 2020-01-10 DIAGNOSIS — Z008 Encounter for other general examination: Secondary | ICD-10-CM | POA: Diagnosis not present

## 2020-01-10 DIAGNOSIS — E669 Obesity, unspecified: Secondary | ICD-10-CM | POA: Diagnosis not present

## 2020-01-10 DIAGNOSIS — E785 Hyperlipidemia, unspecified: Secondary | ICD-10-CM | POA: Diagnosis not present

## 2020-01-10 DIAGNOSIS — Z7982 Long term (current) use of aspirin: Secondary | ICD-10-CM | POA: Diagnosis not present

## 2020-01-10 DIAGNOSIS — I679 Cerebrovascular disease, unspecified: Secondary | ICD-10-CM | POA: Diagnosis not present

## 2020-01-10 DIAGNOSIS — G4733 Obstructive sleep apnea (adult) (pediatric): Secondary | ICD-10-CM | POA: Diagnosis not present

## 2020-01-10 DIAGNOSIS — Z7902 Long term (current) use of antithrombotics/antiplatelets: Secondary | ICD-10-CM | POA: Diagnosis not present

## 2020-01-10 DIAGNOSIS — I10 Essential (primary) hypertension: Secondary | ICD-10-CM | POA: Diagnosis not present

## 2020-01-15 DIAGNOSIS — R69 Illness, unspecified: Secondary | ICD-10-CM | POA: Diagnosis not present

## 2020-01-30 DIAGNOSIS — H6123 Impacted cerumen, bilateral: Secondary | ICD-10-CM | POA: Diagnosis not present

## 2020-02-14 DIAGNOSIS — G4733 Obstructive sleep apnea (adult) (pediatric): Secondary | ICD-10-CM | POA: Diagnosis not present

## 2020-02-15 ENCOUNTER — Encounter: Payer: Self-pay | Admitting: Neurology

## 2020-02-15 ENCOUNTER — Ambulatory Visit: Payer: Medicare HMO | Admitting: Neurology

## 2020-02-15 ENCOUNTER — Other Ambulatory Visit: Payer: Self-pay

## 2020-02-15 VITALS — BP 136/84 | HR 79 | Ht 71.0 in | Wt 223.0 lb

## 2020-02-15 DIAGNOSIS — I679 Cerebrovascular disease, unspecified: Secondary | ICD-10-CM | POA: Diagnosis not present

## 2020-02-15 MED ORDER — CLOPIDOGREL BISULFATE 75 MG PO TABS: 75.0000 mg | ORAL_TABLET | Freq: Every day | ORAL | 3 refills | Status: DC

## 2020-02-15 NOTE — Patient Instructions (Signed)
Return to clinic in 1 year.

## 2020-02-15 NOTE — Progress Notes (Signed)
Follow-up Visit   Date: 02/15/20   Jesus Juarez MRN: [id number] DOB: Jul 27, 1950   Interim History: Jesus Juarez is a 69 y.o. left-handed Caucasian male with hypertension, OSA, and TIA (2020) returning to the clinic for follow-up of intracranial stenosis.  The patient was accompanied to the clinic by self.  History of present illness: Starting on 06/11/2018, he began having spells of tingling involving the entire right arm and numbness over the face, lasting 30-seconds and then resolving.  This occurs 1-2 times per day. There is no speech changes, vision changes, or limb weakness.   He went to the ER on 2/10 where CT head and MRI head was normal. He was recommended to take aspirin 81mg , but is not compliant with taking this due to reading side effects.  He does not have history of hypertension and blood pressure was elevated 175/88 and was started on amlodipine.  Over the next two weeks, he continued to have spells of right face, arm, and torso numbness/tingling, the longest which lasted 5 minute.  He noticed that it was always be associated with his blood pressure being elevated.    He did not tolerate statin therapy in the past and does not want to take this again.  UPDATE 02/15/2020:  He is here for follow-up visit.  He has been doing well and denies any vision changes, numbness/tingling, or weakness.  He has CTA head and neck from May 2021 showed left M1 occlusion with collateral flow and 50% right M1 stenosis, which is stable.  Bilateral ICA are patent. He has been compliant with taking his medication and is hoping to make lifestyle changes to reduce his blood pressure medications.  He is tolerating aspirin 81mg , plavix 75mg , and pravastatin 40mg .    Medications:  Current Outpatient Medications on File Prior to Visit  Medication Sig Dispense Refill  . amLODipine (NORVASC) 5 MG tablet TAKE 1 TABLET BY MOUTH EVERY DAY 90 tablet 3  . aspirin EC 81 MG tablet Take 81 mg by mouth daily.     . B Complex Vitamins (VITAMIN-B COMPLEX PO) Take 1 tablet by mouth daily.     . chlorthalidone (HYGROTON) 25 MG tablet Take 1 tablet (25 mg total) by mouth daily. 90 tablet 3  . cholecalciferol (VITAMIN D) 1000 units tablet Take 1,000 Units by mouth 2 (two) times daily.    . Multiple Vitamin (MULTIVITAMIN) tablet Take 1 tablet by mouth daily.    . pravastatin (PRAVACHOL) 40 MG tablet TAKE 1 TABLET BY MOUTH EVERY DAY 90 tablet 3  . vitamin C (ASCORBIC ACID) 500 MG tablet Take 500 mg by mouth daily.     No current facility-administered medications on file prior to visit.    Allergies:  Allergies  Allergen Reactions  . Penicillins Hives    Did it involve swelling of the face/tongue/throat, SOB, or low BP? No Did it involve sudden or severe rash/hives, skin peeling, or any reaction on the inside of your mouth or nose? Yes Did you need to seek medical attention at a hospital or doctor's office? Unknown When did it last happen? If all above answers are "NO", may proceed with cephalosporin use.   . Sulfur Rash    Vital Signs:  BP 136/84   Pulse 79   Ht 5\' 11"  (1.803 m)   Wt 223 lb (101.2 kg)   SpO2 98%   BMI 31.10 kg/m     Neurological Exam: MENTAL STATUS including orientation to time, place, person,  recent and remote memory, attention span and concentration, language, and fund of knowledge is normal.  Speech is not dysarthric.  CRANIAL NERVES:  No visual field defects.  Pupils equal round and reactive to light.  Normal conjugate, extra-ocular eye movements in all directions of gaze.  No ptosis.  Face is symmetric.   MOTOR:  Motor strength is 5/5 in all extremities.  No atrophy, fasciculations or abnormal movements.  No pronator drift.  Tone is normal.    MSRs:  Reflexes are 2+/4 throughout.  SENSORY:  Intact to vibration throughout.  COORDINATION/GAIT:  Normal finger-to- nose-finger.  Intact rapid alternating movements bilaterally.  Gait narrow based and stable. Tandem  gait intact  Data:  Lab Results  Component Value Date   CHOL 188 07/06/2018   HDL 47.00 07/06/2018   LDLDIRECT 123.0 07/06/2018   TRIG 216.0 (H) 07/06/2018   CHOLHDL 4 07/06/2018    CTA head and neck 09/20/2019: Aortic Atherosclerosis (ICD10-I70.0).  Both carotid bifurcations widely patent. No stenosis or atherosclerotic disease seen on the right. Minimal soft plaque at the bifurcation on the left with a small lateral ulceration but no stenosis.  Mild atherosclerotic irregularity in both carotid siphon regions.  Redemonstration of occlusion of the left M1 segment with distal collateral vessels.  Redemonstration of 50% stenosis of the right M1 segment, not visibly progressive since the previous study.  Several thyroid nodules. The largest is 16 mm at the lower pole on the left. Recommend thyroid US (ref: J Am Coll Radiol. 2015 Feb;12(2): 143-50).  MRI brain wo contrast 06/12/2018:  IMPRESSION: 1. Negative brain MRI. No acute intracranial abnormality identified. 2. Left mastoid effusion.  CT head wo contrast 06/12/2018:  Normal  IMPRESSION/PLAN: Transient ischemic attack (2020) due to intracranial stenosis, no recurrent events.  He has known left M1 occlusion and 50% stenosis of the right M1, which has been stable on imaging from 09/2019  - Continue plavix 75mg  daily and aspirin 81mg  daily  - Continue pravastatin 40mg  daily (LDL is elevated, 123)  - Encouraged healthy lifestyle changes  Return to clinic in 1 year  Thank you for allowing me to participate in patient's care.  If I can answer any additional questions, I would be pleased to do so.    Sincerely,    Shahrzad Koble K. Posey Pronto, DO

## 2020-04-15 DIAGNOSIS — G4733 Obstructive sleep apnea (adult) (pediatric): Secondary | ICD-10-CM | POA: Diagnosis not present

## 2020-04-17 ENCOUNTER — Other Ambulatory Visit (HOSPITAL_COMMUNITY): Payer: Self-pay | Admitting: Interventional Radiology

## 2020-04-18 ENCOUNTER — Other Ambulatory Visit (HOSPITAL_COMMUNITY): Payer: Self-pay | Admitting: Interventional Radiology

## 2020-04-18 ENCOUNTER — Encounter (HOSPITAL_COMMUNITY): Payer: Self-pay

## 2020-04-18 DIAGNOSIS — I771 Stricture of artery: Secondary | ICD-10-CM

## 2020-04-30 ENCOUNTER — Other Ambulatory Visit (HOSPITAL_COMMUNITY): Payer: Self-pay

## 2020-04-30 ENCOUNTER — Ambulatory Visit (HOSPITAL_COMMUNITY)
Admission: RE | Admit: 2020-04-30 | Discharge: 2020-04-30 | Disposition: A | Discharge status: Home / Self Care | Payer: Medicare HMO | Source: Ambulatory Visit | Attending: Interventional Radiology | Admitting: Interventional Radiology

## 2020-04-30 ENCOUNTER — Other Ambulatory Visit: Payer: Self-pay

## 2020-04-30 DIAGNOSIS — I771 Stricture of artery: Secondary | ICD-10-CM | POA: Insufficient documentation

## 2020-04-30 DIAGNOSIS — I6523 Occlusion and stenosis of bilateral carotid arteries: Secondary | ICD-10-CM | POA: Diagnosis not present

## 2020-04-30 DIAGNOSIS — I672 Cerebral atherosclerosis: Secondary | ICD-10-CM | POA: Diagnosis not present

## 2020-04-30 DIAGNOSIS — I6603 Occlusion and stenosis of bilateral middle cerebral arteries: Secondary | ICD-10-CM | POA: Diagnosis not present

## 2020-04-30 LAB — POCT I-STAT CREATININE: Creatinine, Ser: 1.3 mg/dL — ABNORMAL HIGH (ref 0.61–1.24)

## 2020-04-30 MED ORDER — IOHEXOL 350 MG/ML SOLN
75.0000 mL | Freq: Once | INTRAVENOUS | Status: AC | PRN
  Administered 2020-04-30: 75 mL via INTRAVENOUS

## 2020-05-01 ENCOUNTER — Telehealth (HOSPITAL_COMMUNITY): Payer: Self-pay

## 2020-05-01 NOTE — Telephone Encounter (Signed)
Pt agreed to f/u in 6 months with cta head/neck. AW 

## 2020-05-26 DIAGNOSIS — H02831 Dermatochalasis of right upper eyelid: Secondary | ICD-10-CM | POA: Diagnosis not present

## 2020-05-26 DIAGNOSIS — H40033 Anatomical narrow angle, bilateral: Secondary | ICD-10-CM | POA: Diagnosis not present

## 2020-05-26 DIAGNOSIS — H02834 Dermatochalasis of left upper eyelid: Secondary | ICD-10-CM | POA: Diagnosis not present

## 2020-05-26 DIAGNOSIS — H2513 Age-related nuclear cataract, bilateral: Secondary | ICD-10-CM | POA: Diagnosis not present

## 2020-05-28 DIAGNOSIS — L570 Actinic keratosis: Secondary | ICD-10-CM | POA: Diagnosis not present

## 2020-05-28 DIAGNOSIS — L718 Other rosacea: Secondary | ICD-10-CM | POA: Diagnosis not present

## 2020-05-28 DIAGNOSIS — L814 Other melanin hyperpigmentation: Secondary | ICD-10-CM | POA: Diagnosis not present

## 2020-05-28 DIAGNOSIS — L821 Other seborrheic keratosis: Secondary | ICD-10-CM | POA: Diagnosis not present

## 2020-05-28 DIAGNOSIS — L819 Disorder of pigmentation, unspecified: Secondary | ICD-10-CM | POA: Diagnosis not present

## 2020-05-28 DIAGNOSIS — D229 Melanocytic nevi, unspecified: Secondary | ICD-10-CM | POA: Diagnosis not present

## 2020-06-17 ENCOUNTER — Other Ambulatory Visit: Payer: Self-pay

## 2020-06-17 ENCOUNTER — Encounter: Payer: Self-pay | Admitting: Gastroenterology

## 2020-06-17 ENCOUNTER — Telehealth: Payer: Self-pay

## 2020-06-17 ENCOUNTER — Ambulatory Visit: Payer: Medicare HMO | Admitting: Gastroenterology

## 2020-06-17 VITALS — BP 130/70 | HR 84 | Ht 70.5 in | Wt 227.0 lb

## 2020-06-17 DIAGNOSIS — K2100 Gastro-esophageal reflux disease with esophagitis, without bleeding: Secondary | ICD-10-CM

## 2020-06-17 DIAGNOSIS — Z7902 Long term (current) use of antithrombotics/antiplatelets: Secondary | ICD-10-CM

## 2020-06-17 DIAGNOSIS — R142 Eructation: Secondary | ICD-10-CM

## 2020-06-17 DIAGNOSIS — R195 Other fecal abnormalities: Secondary | ICD-10-CM | POA: Diagnosis not present

## 2020-06-17 NOTE — Telephone Encounter (Signed)
[facility] Pre-operative Risk Assessment     Jesus Juarez [birth year] [id number]  Procedure: Colonoscopy Anesthesia type:  MAC Procedure Date: 08/05/20 Provider: Dr. Tarri Glenn  Type of Clearance needed: Pharmacy  Medication(s) needing held: Plavix   Length of time for medication to be held: 7 days  Please review request and advise by either responding to this message or by sending your response to the fax # provided below.  Thank you,  [facility]  Phone: [phone] Fax: [phone] ATTENTION: Kazi Montoro, LPN

## 2020-06-17 NOTE — Telephone Encounter (Signed)
Records Request  Provider and/or Facility: [facility]  Document: Signed ROI  ROI has been faxed successfully to [facility]. Document(s) and fax confirmation(s) have been placed in the faxed file for future reference.

## 2020-06-17 NOTE — Telephone Encounter (Signed)
OK to hold plavix for procedure.

## 2020-06-17 NOTE — Telephone Encounter (Signed)
Called pt and advised about Dr. Serita Grit response below. Verbalized acceptance and understanding. Routing this message to Dr. Tarri Glenn to make her aware as well.

## 2020-06-17 NOTE — Progress Notes (Signed)
Referring Provider: Rory Percy, MD Primary Care Physician:  Rory Percy, MD  Reason for Consultation: Positive Cologuard   IMPRESSION:  Positive Cologuard without overt bleeding or known anemia Eructation and reflux x >30 years    - Previously treated with ranitidine PRN Chronic Plavix for history of TIA in 2020 (prescribed by Dr. Posey Pronto with [facility])   Positive Cologuard without overt bleeding or known anemia: Colonoscopy recommended after a Plavix washout.  Hold Plavix for 5 days before endoscopy.  I discussed with the patient that there is a low, but real, risk of a cardiovascular event such as heart attack, stroke, or embolism/thrombosis while off Plavix. Will communicate with Dr. Posey Pronto to confirm that holding the Plavix is appropriate at this time.   Eructation and reflux x > 30 years: EGD recommended to evaluate for complications of chronic reflux such as Barrett's Esophagus  PLAN: Colonoscopy after a Plavix washout EGD Obtain prior colonoscopy report from [facility]  Please see the "Patient Instructions" section for addition details about the plan.  I spent 45 minutes, including in depth chart review, independent review of results, communicating results with the patient directly, face-to-face time with the patient, coordinating care, ordering studies and medications as appropriate, and documentation.   HPI: Jesus Juarez is a 70 y.o. male referred by Dr. Nadara Mustard for a positive Cologuard. History of TIA in 2020 on Plavix, hypertension, and OSA.   His insurance sent him a Cologuard that returned positive. No symptoms associated with the + cologuard.  No overt GI blood loss except for an ocassional a pinkish color at time. No melena, hematochezia, bright red blood per rectum. No epistaxis, hemoptysis, or hematuria.    He reports a lifetime history of frequent eructation and reflux (at least 30 years of symptoms). Was using Zantac PRN until the recall. Now he avoids  eating at night to minimize his symptoms. No dysphagia or odynophagia, iron deficiency anemia, anorexia, unexplained weight loss, persistent vomiting, or gastrointestinal cancer in a first-degree relative.  Labs 10/03/2018 show a normal CBC  Prior colonoscopy 2016 in [place]. He understands that the colonoscopy was normal.   No known family history of colon cancer or polyps. No family history of uterine/endometrial cancer, pancreatic cancer or gastric/stomach cancer.   Past Medical History:  Diagnosis Date  . History of cardiac catheterization    Normal coronaries - approximately 20 years ago  . Hypertension   . Obstructive sleep apnea   . Vitamin D deficiency     Past Surgical History:  Procedure Laterality Date  . IR ANGIO INTRA EXTRACRAN SEL COM CAROTID INNOMINATE BILAT MOD SED  10/03/2018  . IR ANGIO VERTEBRAL SEL SUBCLAVIAN INNOMINATE UNI L MOD SED  10/03/2018  . IR ANGIO VERTEBRAL SEL VERTEBRAL UNI R MOD SED  10/03/2018  . IR US GUIDE VASC ACCESS RIGHT  10/03/2018    Current Outpatient Medications  Medication Sig Dispense Refill  . amLODipine (NORVASC) 5 MG tablet TAKE 1 TABLET BY MOUTH EVERY DAY 90 tablet 3  . aspirin EC 81 MG tablet Take 81 mg by mouth daily.    . B Complex Vitamins (VITAMIN-B COMPLEX PO) Take 1 tablet by mouth daily.     . chlorthalidone (HYGROTON) 25 MG tablet Take 1 tablet (25 mg total) by mouth daily. 90 tablet 3  . cholecalciferol (VITAMIN D) 1000 units tablet Take 1,000 Units by mouth 2 (two) times daily.    . clopidogrel (PLAVIX) 75 MG tablet Take 1 tablet (75 mg total)  by mouth daily. 90 tablet 3  . Multiple Vitamin (MULTIVITAMIN) tablet Take 1 tablet by mouth daily.    . pravastatin (PRAVACHOL) 40 MG tablet TAKE 1 TABLET BY MOUTH EVERY DAY 90 tablet 3  . vitamin C (ASCORBIC ACID) 500 MG tablet Take 500 mg by mouth daily.     No current facility-administered medications for this visit.    Allergies as of 06/17/2020 - Review Complete 02/15/2020  Allergen  Reaction Noted  . Penicillins Hives 10/09/2015  . Elemental sulfur Rash 10/11/2016    Family History  Problem Relation Age of Onset  . Supraventricular tachycardia Mother   . Hypertension Mother      Review of Systems: 12 system ROS is negative except as noted above with the addition of fatigue, hearing problems, shortness of breath, insomnia, excessive urination.   Physical Exam: General:   Alert,  well-nourished, pleasant and cooperative in NAD Head:  Normocephalic and atraumatic. Eyes:  Sclera clear, no icterus.   Conjunctiva pink. Ears:  Normal auditory acuity. Nose:  No deformity, discharge,  or lesions. Mouth:  No deformity or lesions.   Neck:  Supple; no masses or thyromegaly. Lungs:  Clear throughout to auscultation.   No wheezes. Heart:  Regular rate and rhythm; no murmurs. Abdomen:  Soft, nontender, nondistended, normal bowel sounds, no rebound or guarding. No hepatosplenomegaly.   Rectal:  Deferred  Msk:  Symmetrical. No boney deformities LAD: No inguinal or umbilical LAD Extremities:  No clubbing or edema. Neurologic:  Alert and  oriented x4;  grossly nonfocal Skin:  Intact without significant lesions or rashes. Psych:  Alert and cooperative. Normal mood and affect.    Khalis Hittle L. Tarri Glenn, MD, MPH 06/17/2020, 9:05 AM

## 2020-06-17 NOTE — Patient Instructions (Signed)
RECOMMENDATION(S):   COLONOSCOPY AND ENDOSCOPY:   . You have been scheduled for an endoscopy and a colonoscopy. Please follow the written instructions given to you at your visit today.  PREP:   . Please pick up your prep supplies at the pharmacy within the next 1-3 days.  INHALERS:   . If you use inhalers (even only as needed), please bring them with you on the day of your procedure.  PLAVIX:   We will request permission from your provider to hold your Plavix  RECORDS:   We will obtain your most recent colonoscopy report from [facility]  BMI:  If you are age 69 or older, your body mass index should be between 23-30. Your Body mass index is 32.11 kg/m. If this is out of the aforementioned range listed, please consider follow up with your Primary Care Provider.  Thank you for trusting me with your gastrointestinal care!    Thornton Park, MD, MPH

## 2020-06-17 NOTE — Telephone Encounter (Signed)
Thank you. Proceed with scheduling.

## 2020-06-18 NOTE — Telephone Encounter (Signed)
Colonoscopy with Dr. Anthony Sar at [facility] 04/21/15: Normal colonoscopy  No new recommendations after reviewing these results.

## 2020-06-19 ENCOUNTER — Encounter: Payer: Self-pay | Admitting: Cardiovascular Disease

## 2020-06-19 ENCOUNTER — Other Ambulatory Visit: Payer: Self-pay

## 2020-06-19 ENCOUNTER — Ambulatory Visit (INDEPENDENT_AMBULATORY_CARE_PROVIDER_SITE_OTHER): Payer: Medicare HMO | Admitting: Cardiovascular Disease

## 2020-06-19 VITALS — BP 120/78 | HR 63 | Ht 71.0 in | Wt 227.0 lb

## 2020-06-19 DIAGNOSIS — C44329 Squamous cell carcinoma of skin of other parts of face: Secondary | ICD-10-CM | POA: Diagnosis not present

## 2020-06-19 DIAGNOSIS — G4733 Obstructive sleep apnea (adult) (pediatric): Secondary | ICD-10-CM | POA: Diagnosis not present

## 2020-06-19 DIAGNOSIS — E785 Hyperlipidemia, unspecified: Secondary | ICD-10-CM

## 2020-06-19 DIAGNOSIS — I679 Cerebrovascular disease, unspecified: Secondary | ICD-10-CM | POA: Diagnosis not present

## 2020-06-19 DIAGNOSIS — D485 Neoplasm of uncertain behavior of skin: Secondary | ICD-10-CM | POA: Diagnosis not present

## 2020-06-19 DIAGNOSIS — I10 Essential (primary) hypertension: Secondary | ICD-10-CM | POA: Diagnosis not present

## 2020-06-19 NOTE — Progress Notes (Signed)
Cardiology Office Note    Date:  06/21/2020   ID:  Jesus Juarez, DOB 1951/01/01, MRN [id number]  PCP:  Rory Percy, MD  Cardiologist:  Shelva Majestic, MD (sleep); Dr. Percival Spanish  F/U sleep evaluation  History of Present Illness:  Jesus Juarez is a 70 y.o. male who is followed by Dr. Percival Spanish for his cardiology care.  He has a long history of sleep apnea for greater than 25 years.  After moving to [name], he established care with me in follow-up of his sleep apnea in October 2020.  He presents for a 4-month follow-up evaluation.  Mr. 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  He has had issues with intermittent leg swelling.    Mr. Chrisley was originally diagnosed with sleep apnea over 25 years ago in the early 1990s.   He has utilized several CPAP machines over the years.  He has not seen a sleep physician for a long time.  Recently had been [place] and his DME company was [employer].  It appears that he underwent a reevaluation with Novasom sleep study in August 2017 prior to receiving a new machine and his AHI was 51.9/h.  He had received a ResMed AirSense machine but over the past half a year he had reverted back to using his S9 Elite CPAP machine which is not wireless.  With the move he is packed up his new ResMed and apparently had not unpacked it yet to start using his new machine.  There is concern that since he had not been evaluated in several years that his settings may not be optimal and may need adjustment in verification.  For this reason he was scheduled for a new sleep evaluation with me.  Presently, he admits to 100% CPAP use.  We were able to obtain a download of his S9 Elite non-wireless CPAP machine from his card in the device.  Usage is 100% over 90 days and he is averaging 8 hours and 6 minutes of CPAP use.  At a 10 cm set pressure, AHI remains mildly increased at 6.3 with an apnea  index of 3.6 and hypopnea index of 2.7.  He is unaware of breakthrough snoring.  He denies bruxism, restless legs, hypnagogic hallucinations, or cataplectic events.  Prior initiating CPAP therapy he would have frequent awakenings, and awakening gasping for breath.  He also experienced significant daytime sleepiness.  At his initial evaluation I calculated an Epworth Sleepiness Scale score which endorsed at 10 and as shown below:  Epworth Sleepiness Scale: Situation   Chance of Dozing/Sleeping (0 = never , 1 = slight chance , 2 = moderate chance , 3 = high chance )   sitting and reading 1   watching TV 2   sitting inactive in a public place 1   being a passenger in a motor vehicle for an hour or more 2   lying down in the afternoon 2   sitting and talking to someone 0   sitting quietly after lunch (no alcohol) 2   while stopped for a few minutes in traffic as the driver 0   Total Score  10   During his initial evaluation with me he had brought his machine with him and a download from his card revealed 100% compliance with average use at 8 hours and 6 minutes.  He was set at a 10 cm pressure and AHI was  6.3.  At that time I recommended increasing CPAP to 12 cm water pressure.  Since that time we were able to arrange for his DME company at [facility] so that his machine could be linked to our office.  Since I last saw him, he has continued to use CPAP with excellent compliance.  He typically goes to bed at 11 PM and wakes up at 7 AM.  He has nocturia approximately 1 time per night.  A download was obtained from January 17 through June 17, 2020.  Usage is 100% with average use at 7 hours and 56 minutes.  His air sense 10 AutoSet unit is set at a minimum pressure of 12 with maximum up to 18.  There was some days of increased leak.  AHI was 4.0.  Upon further questioning, the patient leaves his machine on when he goes to the bathroom.  This may be creating a artery factual increase in his 95th  percentile average pressure which is 15.8 with maximum average pressure at 17.1/h.  A new Epworth Sleepiness Scale score was calculated in the office today and this endorsed at 13 suggestive of residual daytime sleepiness.    He has continued to be on amlodipine 5 mg and chlorthalidone 25 mg for hypertension.  He is on pravastatin for hyperlipidemia.  He is on Plavix daily with remote symptoms of possible TIA and branch vessel occlusion off the left middle cerebral artery.  He presents for follow-up evaluation   Past Medical History:  Diagnosis Date  . Brain TIA    locked arteries in head  . History of cardiac catheterization    Normal coronaries - approximately 20 years ago  . HLD (hyperlipidemia)   . Hypertension   . Obstructive sleep apnea    CPAP  . Vitamin D deficiency     Past Surgical History:  Procedure Laterality Date  . IR ANGIO INTRA EXTRACRAN SEL COM CAROTID INNOMINATE BILAT MOD SED  10/03/2018  . IR ANGIO VERTEBRAL SEL SUBCLAVIAN INNOMINATE UNI L MOD SED  10/03/2018  . IR ANGIO VERTEBRAL SEL VERTEBRAL UNI R MOD SED  10/03/2018  . IR US GUIDE VASC ACCESS RIGHT  10/03/2018    Current Medications: Outpatient Medications Prior to Visit  Medication Sig Dispense Refill  . amLODipine (NORVASC) 5 MG tablet TAKE 1 TABLET BY MOUTH EVERY DAY 90 tablet 3  . B Complex Vitamins (VITAMIN-B COMPLEX PO) Take 1 tablet by mouth daily.     . cholecalciferol (VITAMIN D) 1000 units tablet Take 1,000 Units by mouth 2 (two) times daily.    . clopidogrel (PLAVIX) 75 MG tablet Take 1 tablet (75 mg total) by mouth daily. 90 tablet 3  . Multiple Vitamin (MULTIVITAMIN) tablet Take 1 tablet by mouth daily.    . pravastatin (PRAVACHOL) 40 MG tablet TAKE 1 TABLET BY MOUTH EVERY DAY 90 tablet 3  . vitamin C (ASCORBIC ACID) 500 MG tablet Take 500 mg by mouth daily.    . chlorthalidone (HYGROTON) 25 MG tablet Take 1 tablet (25 mg total) by mouth daily. 90 tablet 3   No facility-administered medications prior  to visit.     Allergies:   Penicillins and Elemental sulfur   Social History   Socioeconomic History  . Marital status: Married    Spouse name: Not on file  . Number of children: 4  . Years of education: 12  . Highest education level: Associate degree: academic program  Occupational History  . Occupation: retired  Tobacco  Use  . Smoking status: Former Smoker    Types: Cigarettes  . Smokeless tobacco: Never Used  . Tobacco comment: tried as a teenager about a pack  Vaping Use  . Vaping Use: Never used  Substance and Sexual Activity  . Alcohol use: Yes    Alcohol/week: 0.0 standard drinks    Comment: occasional beer  . Drug use: No  . Sexual activity: Not on file  Other Topics Concern  . Not on file  Social History Narrative   Lives with wife in a one story home.  Has 4 children.     Retired from [employer]) in 2008.     Education: 2 year college degree.    Left Handed   Social Determinants of Health   Financial Resource Strain: Not on file  Food Insecurity: Not on file  Transportation Needs: Not on file  Physical Activity: Not on file  Stress: Not on file  Social Connections: Not on file   He retired at age 44.  He has 4 children and 1 grandchild.  Family History:  The patient's family history includes Breast cancer in his mother; Dementia in his mother; Heart disease in his maternal grandmother; Hypertension in his mother; Kidney disease in his maternal grandmother; Supraventricular tachycardia in his mother.  His father died at age 37.  His mother is still living.  ROS General: Negative; No fevers, chills, or night sweats;  HEENT: Negative; No changes in vision or hearing, sinus congestion, difficulty swallowing Pulmonary: Negative; No cough, wheezing, shortness of breath, hemoptysis Cardiovascular: Negative; No chest pain, presyncope, syncope, palpitations GI: Negative; No nausea, vomiting, diarrhea, or abdominal pain GU: Negative; No  dysuria, hematuria, or difficulty voiding Musculoskeletal: Negative; no myalgias, joint pain, or weakness Hematologic/Oncology: Negative; no easy bruising, bleeding Endocrine: Negative; no heat/cold intolerance; no diabetes Neuro: Negative; no changes in balance, headaches Skin: Negative; No rashes or skin lesions Psychiatric: Negative; No behavioral problems, depression Sleep: Positive for OSA on CPAP no awareness of breakthrough snoring.  Positive for residual daytime sleepiness with an Epworth Sleepiness Scale score endorsed today at 13;  no bruxism, restless legs, hypnogognic hallucinations, no cataplexy Other comprehensive 14 point system review is negative.   PHYSICAL EXAM:   VS:  BP 120/78   Pulse 63   Ht $R'5\' 11"'rj$  (1.803 m)   Wt 227 lb (103 kg)   SpO2 98%   BMI 31.66 kg/m     Repeat blood pressure by me 136/78  Wt Readings from Last 3 Encounters:  06/19/20 227 lb (103 kg)  06/17/20 227 lb (103 kg)  02/15/20 223 lb (101.2 kg)    General: Alert, oriented, no distress.  Skin: normal turgor, no rashes, warm and dry HEENT: Normocephalic, atraumatic. Pupils equal round and reactive to light; sclera anicteric; extraocular muscles intact;  Nose without nasal septal hypertrophy Mouth/Parynx benign; Mallinpatti scale 3 Neck: No JVD, no carotid bruits; normal carotid upstroke Lungs: clear to ausculatation and percussion; no wheezing or rales Chest wall: without tenderness to palpitation Heart: PMI not displaced, RRR, s1 s2 normal, 1/6 systolic murmur, no diastolic murmur, no rubs, gallops, thrills, or heaves Abdomen: soft, nontender; no hepatosplenomehaly, BS+; abdominal aorta nontender and not dilated by palpation. Back: no CVA tenderness Pulses 2+ Musculoskeletal: full range of motion, normal strength, no joint deformities Extremities: no clubbing cyanosis or edema, Homan's sign negative  Neurologic: grossly nonfocal; Cranial nerves grossly wnl Psychologic: Normal mood and  affect   Studies/Labs Reviewed:   EKG:  NSR at  63, no ectopy; QTc 431  ECG (independently read by me):Normal sinus rhythm at 60 bpm.  RV conduction delay.  Nonspecific ST changes.  Recent Labs: BMP Latest Ref Rng & Units 04/30/2020 09/20/2019 01/24/2019  Glucose 70 - 99 mg/dL - - -  BUN 8 - 23 mg/dL - - -  Creatinine 0.61 - 1.24 mg/dL 1.30(H) 1.10 1.00  Sodium 135 - 145 mmol/L - - -  Potassium 3.5 - 5.1 mmol/L - - -  Chloride 98 - 111 mmol/L - - -  CO2 22 - 32 mmol/L - - -  Calcium 8.9 - 10.3 mg/dL - - -     Hepatic Function Latest Ref Rng & Units 06/12/2018  Total Protein 6.5 - 8.1 g/dL 7.2  Albumin 3.5 - 5.0 g/dL 4.0  AST 15 - 41 U/L 26  ALT 0 - 44 U/L 26  Alk Phosphatase 38 - 126 U/L 66  Total Bilirubin 0.3 - 1.2 mg/dL 0.5    CBC Latest Ref Rng & Units 10/03/2018 06/12/2018  WBC 4.0 - 10.5 K/uL 6.3 8.6  Hemoglobin 13.0 - 17.0 g/dL 14.1 15.2  Hematocrit 39.0 - 52.0 % 41.7 46.2  Platelets 150 - 400 K/uL 220 237   Lab Results  Component Value Date   MCV 88.0 10/03/2018   MCV 88.0 06/12/2018   Lab Results  Component Value Date   TSH 2.62 07/06/2018   Lab Results  Component Value Date   HGBA1C 5.6 07/06/2018     BNP No results found for: BNP  ProBNP No results found for: PROBNP   Lipid Panel     Component Value Date/Time   CHOL 188 07/06/2018 0729   TRIG 216.0 (H) 07/06/2018 0729   HDL 47.00 07/06/2018 0729   CHOLHDL 4 07/06/2018 0729   VLDL 43.2 (H) 07/06/2018 0729   LDLDIRECT 123.0 07/06/2018 0729     RADIOLOGY: No results found.   Additional studies/ records that were reviewed today include:  I reviewed the records of Dr. Percival Spanish, imaging studies,Novasom sleep study from 01/18/16 and obtained a download from his card in his old S9 Elite CPAP unit  A new download was obtained from his ResMed air sense 10 CPAP auto unit  ASSESSMENT:    1. OSA (obstructive sleep apnea)   2. Essential hypertension   3. Cerebrovascular disease   4.  Hyperlipidemia with target LDL less than 70    PLAN:  Mr. Herson Prichard is a 70 year old gentleman who has a history of hypertension, documented aortic atherosclerosis as well as a remote TIA.  He was originally diagnosed with obstructive sleep apnea in the early 1990s and has been on CPAP therapy for almost 30 years.  His recent sleep evaluation in August 2017 confirmed severe sleep apnea with an AHI of 51.9/h.  He received a new ResMed air sense 10 CPAP machine at that time, but when he packed up for his move, he reverted back to using his old S9 Elite CPAP unit.  When I saw him in 2020, I interrogated his old [employer].  At that time adjustments were made.  He is now using [facility] in [place].  We were able to contact them and his machine is now linked to our office for my review.  Presently, he has been keeping his machine on when he takes his mask off to go to the bathroom which is seemingly creating the need for higher pressure.  He also has had some days of mask leak.  His AHI is 4.0 and if the leak is considered I suspect his overall more accurate AHI is less.  He he believes he is sleeping well and is unaware of any breakthrough snoring.  He typically goes to bed around 11 PM and wakes up at 7 AM and has been having nocturia 1 time per night compared to previous significant increase.  He is uncertain what his mass type is but it seems like he is using an AirFit F 20 fullface mask.  I did discuss with him possible additional options.  His blood pressure today is stable on current therapy.  He is unaware of nocturnal palpitations.  He continues to be on pravastatin with LDL goal less than 70.  Labs are followed by his primary physician.  As long as he is stable I will see him in 1 year for reevaluation or sooner as needed.   Medication Adjustments/Labs and Tests Ordered: Current medicines are reviewed at length with the patient today.  Concerns regarding medicines are outlined above.   Medication changes, Labs and Tests ordered today are listed in the Patient Instructions below. Patient Instructions  Medication Instructions:  The current medical regimen is effective;  continue present plan and medications.  *If you need a refill on your cardiac medications before your next appointment, please call your pharmacy*   Follow-Up: At [facility], you and your health needs are our priority.  As part of our continuing mission to provide you with exceptional heart care, we have created designated Provider Care Teams.  These Care Teams include your primary Cardiologist (physician) and Advanced Practice Providers (APPs -  Physician Assistants and Nurse Practitioners) who all work together to provide you with the care you need, when you need it.  We recommend signing up for the patient portal called "MyChart".  Sign up information is provided on this After Visit Summary.  MyChart is used to connect with patients for Virtual Visits (Telemedicine).  Patients are able to view lab/test results, encounter notes, upcoming appointments, etc.  Non-urgent messages can be sent to your provider as well.   To learn more about what you can do with MyChart, go to [email or web address].    Your next appointment:   12 month(s)  The format for your next appointment:   In Person  Provider:   Shelva Majestic, MD        Signed, Shelva Majestic, MD  06/21/2020 1:09 PM    [facility] [address] [place], [place], [place]  [address] Phone: [phone]

## 2020-06-19 NOTE — Patient Instructions (Signed)

## 2020-06-21 ENCOUNTER — Encounter: Payer: Self-pay | Admitting: Cardiovascular Disease

## 2020-07-30 DIAGNOSIS — H6123 Impacted cerumen, bilateral: Secondary | ICD-10-CM | POA: Diagnosis not present

## 2020-08-05 ENCOUNTER — Other Ambulatory Visit: Payer: Self-pay

## 2020-08-05 ENCOUNTER — Ambulatory Visit (AMBULATORY_SURGERY_CENTER): Payer: Medicare HMO | Admitting: Gastroenterology

## 2020-08-05 ENCOUNTER — Encounter: Payer: Self-pay | Admitting: Gastroenterology

## 2020-08-05 VITALS — BP 99/61 | HR 64 | Temp 98.6°F | Resp 11 | Ht 70.0 in | Wt 227.0 lb

## 2020-08-05 DIAGNOSIS — G4733 Obstructive sleep apnea (adult) (pediatric): Secondary | ICD-10-CM | POA: Diagnosis not present

## 2020-08-05 DIAGNOSIS — K2970 Gastritis, unspecified, without bleeding: Secondary | ICD-10-CM

## 2020-08-05 DIAGNOSIS — K317 Polyp of stomach and duodenum: Secondary | ICD-10-CM

## 2020-08-05 DIAGNOSIS — D123 Benign neoplasm of transverse colon: Secondary | ICD-10-CM

## 2020-08-05 DIAGNOSIS — K5730 Diverticulosis of large intestine without perforation or abscess without bleeding: Secondary | ICD-10-CM | POA: Diagnosis not present

## 2020-08-05 DIAGNOSIS — D122 Benign neoplasm of ascending colon: Secondary | ICD-10-CM | POA: Diagnosis not present

## 2020-08-05 DIAGNOSIS — K219 Gastro-esophageal reflux disease without esophagitis: Secondary | ICD-10-CM

## 2020-08-05 DIAGNOSIS — K5140 Inflammatory polyps of colon without complications: Secondary | ICD-10-CM

## 2020-08-05 DIAGNOSIS — R195 Other fecal abnormalities: Secondary | ICD-10-CM | POA: Diagnosis not present

## 2020-08-05 DIAGNOSIS — K2950 Unspecified chronic gastritis without bleeding: Secondary | ICD-10-CM | POA: Diagnosis not present

## 2020-08-05 DIAGNOSIS — K529 Noninfective gastroenteritis and colitis, unspecified: Secondary | ICD-10-CM | POA: Diagnosis not present

## 2020-08-05 DIAGNOSIS — I10 Essential (primary) hypertension: Secondary | ICD-10-CM | POA: Diagnosis not present

## 2020-08-05 MED ORDER — SODIUM CHLORIDE 0.9 % IV SOLN: 500.0000 mL | Freq: Once | INTRAVENOUS | Status: DC

## 2020-08-05 NOTE — Patient Instructions (Signed)
Handouts given for Gastritis, polyps, diverticulosis and High Fiber diet.  You may resume your Plavix at prior dose tomorrow.  Await pathology results.  No Aspirin, ibuprofen, naproxen or other NSAIDS.  You may take Tylenol if needed.  YOU HAD AN ENDOSCOPIC PROCEDURE TODAY AT [facility]:   Refer to the procedure report that was given to you for any specific questions about what was found during the examination.  If the procedure report does not answer your questions, please call your gastroenterologist to clarify.  If you requested that your care partner not be given the details of your procedure findings, then the procedure report has been included in a sealed envelope for you to review at your convenience later.  YOU SHOULD EXPECT: Some feelings of bloating in the abdomen. Passage of more gas than usual.  Walking can help get rid of the air that was put into your GI tract during the procedure and reduce the bloating. If you had a lower endoscopy (such as a colonoscopy or flexible sigmoidoscopy) you may notice spotting of blood in your stool or on the toilet paper. If you underwent a bowel prep for your procedure, you may not have a normal bowel movement for a few days.  Please Note:  You might notice some irritation and congestion in your nose or some drainage.  This is from the oxygen used during your procedure.  There is no need for concern and it should clear up in a day or so.  SYMPTOMS TO REPORT IMMEDIATELY:   Following lower endoscopy (colonoscopy or flexible sigmoidoscopy):  Excessive amounts of blood in the stool  Significant tenderness or worsening of abdominal pains  Swelling of the abdomen that is new, acute  Fever of 100F or higher   Following upper endoscopy (EGD)  Vomiting of blood or coffee ground material  New chest pain or pain under the shoulder blades  Painful or persistently difficult swallowing  New shortness of breath  Fever of 100F or  higher  Black, tarry-looking stools  For urgent or emergent issues, a gastroenterologist can be reached at any hour by calling [phone]. Do not use MyChart messaging for urgent concerns.    DIET:  We do recommend a small meal at first, but then you may proceed to your regular diet.  Drink plenty of fluids but you should avoid alcoholic beverages for 24 hours.  ACTIVITY:  You should plan to take it easy for the rest of today and you should NOT DRIVE or use heavy machinery until tomorrow (because of the sedation medicines used during the test).    FOLLOW UP: Our staff will call the number listed on your records 48-72 hours following your procedure to check on you and address any questions or concerns that you may have regarding the information given to you following your procedure. If we do not reach you, we will leave a message.  We will attempt to reach you two times.  During this call, we will ask if you have developed any symptoms of COVID 19. If you develop any symptoms (ie: fever, flu-like symptoms, shortness of breath, cough etc.) before then, please call [phone].  If you test positive for Covid 19 in the 2 weeks post procedure, please call and report this information to [place].    If any biopsies were taken you will be contacted by phone or by letter within the next 1-3 weeks.  Please call us at [phone] if you have not heard  about the biopsies in 3 weeks.    SIGNATURES/CONFIDENTIALITY: You and/or your care partner have signed paperwork which will be entered into your electronic medical record.  These signatures attest to the fact that that the information above on your After Visit Summary has been reviewed and is understood.  Full responsibility of the confidentiality of this discharge information lies with you and/or your care-partner.

## 2020-08-05 NOTE — Progress Notes (Signed)
2518 Robinul 0.1 mg IV given due large amount of secretions upon assessment.  MD made aware, vss

## 2020-08-05 NOTE — Progress Notes (Signed)
Report given to PACU, vss 

## 2020-08-05 NOTE — Progress Notes (Signed)
9728 Ephedrine 10 mg given IV due to low BP, MD updated.

## 2020-08-05 NOTE — Progress Notes (Signed)
Called to room to assist during endoscopic procedure.  Patient ID and intended procedure confirmed with present staff. Received instructions for my participation in the procedure from the performing physician.  

## 2020-08-05 NOTE — Op Note (Signed)
[place] Patient Name: Jesus Juarez Procedure Date: 08/05/2020 8:51 AM MRN: [id number] Endoscopist: Thornton Park MD, MD Age: 70 Referring MD:  Date of Birth: 1951/02/26 Gender: Male Account #: [id number] Procedure:                Colonoscopy Indications:              Positive Cologuard test Medicines:                Monitored Anesthesia Care Procedure:                Pre-Anesthesia Assessment:                           - Prior to the procedure, a History and Physical                            was performed, and patient medications and                            allergies were reviewed. The patient's tolerance of                            previous anesthesia was also reviewed. The risks                            and benefits of the procedure and the sedation                            options and risks were discussed with the patient.                            All questions were answered, and informed consent                            was obtained. Prior Anticoagulants: The patient has                            taken Plavix (clopidogrel), last dose was 5 days                            prior to procedure. ASA Grade Assessment: II - A                            patient with mild systemic disease. After reviewing                            the risks and benefits, the patient was deemed in                            satisfactory condition to undergo the procedure.                           - Prior to the procedure, a History and Physical  was performed, and patient medications and                            allergies were reviewed. The patient's tolerance of                            previous anesthesia was also reviewed. The risks                            and benefits of the procedure and the sedation                            options and risks were discussed with the patient.                            All questions were answered, and  informed consent                            was obtained. Prior Anticoagulants: The patient has                            taken Plavix (clopidogrel), last dose was 5 days                            prior to procedure. ASA Grade Assessment: II - A                            patient with mild systemic disease. After reviewing                            the risks and benefits, the patient was deemed in                            satisfactory condition to undergo the procedure.                           After obtaining informed consent, the colonoscope                            was passed under direct vision. Throughout the                            procedure, the patient's blood pressure, pulse, and                            oxygen saturations were monitored continuously. The                            Olympus CF-HQ190L ([id number]) Colonoscope was                            introduced through the anus and advanced to the 3  cm into the ileum. The colonoscopy was performed                            with moderate difficulty due to multiple                            diverticula in the colon and a tortuous colon.                            Successful completion of the procedure was aided by                            applying abdominal pressure. The patient tolerated                            the procedure well. The quality of the bowel                            preparation was good. The terminal ileum, ileocecal                            valve, appendiceal orifice, and rectum were                            photographed. Scope In: 8:53:05 AM Scope Out: 9:11:14 AM Scope Withdrawal Time: 0 hours 13 minutes 20 seconds  Total Procedure Duration: 0 hours 18 minutes 9 seconds  Findings:                 The perianal and digital rectal examinations were                            normal.                           Multiple small and large-mouthed diverticula were                             found throughout the colon but they were most                            pronounced in the sigmoid colon.                           A patchy area of mildly altered vascular and                            erythematous mucosa was found in the sigmoid colon                            from 25 to 30 cm from the anal verge. Biopsies were                            taken with a cold forceps for histology. Estimated  blood loss was minimal.                           A 4 mm polyp was found in the hepatic flexure. The                            polyp was flat. The polyp was removed with a cold                            snare. Resection and retrieval were complete.                            Estimated blood loss was minimal.                           Two sessile polyps were found in the ascending                            colon. The polyps were 2 to 3 mm in size. These                            polyps were removed with a cold snare. Resection                            and retrieval were complete. Estimated blood loss                            was minimal.                           The exam was otherwise without abnormality on                            direct and retroflexion views. Complications:            No immediate complications. Estimated blood loss:                            Minimal. Estimated Blood Loss:     Estimated blood loss was minimal. Impression:               - Diverticulosis in the sigmoid colon.                           - Altered vascular and erythematous mucosa in the                            sigmoid colon. Biopsied to exclude colitis                            including segmental colitis associated with                            diverticulosis.                           -  One 4 mm polyp at the hepatic flexure, removed                            with a cold snare. Resected and retrieved.                           - Two 2 to  3 mm polyps in the ascending colon,                            removed with a cold snare. Resected and retrieved.                           - The examination was otherwise normal on direct                            and retroflexion views. Recommendation:           - Patient has a contact number available for                            emergencies. The signs and symptoms of potential                            delayed complications were discussed with the                            patient. Return to normal activities tomorrow.                            Written discharge instructions were provided to the                            patient.                           - High fiber diet. Drink at least 64 ounces of                            water. Use a daily stool bulking agent such as                            Metamucil or Benefiber.                           - Continue present medications.                           - Resume Plavix (clopidogrel) at prior dose                            tomorrow.                           - Await pathology results.                           -  Repeat colonoscopy date to be determined after                            pending pathology results are reviewed for                            surveillance.                           - Emerging evidence supports eating a diet of                            fruits, vegetables, grains, calcium, and yogurt                            while reducing red meat and alcohol may reduce the                            risk of colon cancer.                           - Thank you for allowing me to be involved in your                            colon cancer prevention. Thornton Park MD, MD 08/05/2020 9:22:33 AM This report has been signed electronically.

## 2020-08-05 NOTE — Op Note (Signed)
[place] Patient Name: Jesus Juarez Procedure Date: 08/05/2020 8:33 AM MRN: [id number] Endoscopist: Thornton Park MD, MD Age: 70 Referring MD:  Date of Birth: October 12, 1950 Gender: Male Account #: [id number] Procedure:                Upper GI endoscopy Indications:              Esophageal reflux symptoms that persist despite                            appropriate therapy, Eructation Medicines:                Monitored Anesthesia Care Procedure:                Pre-Anesthesia Assessment:                           - Prior to the procedure, a History and Physical                            was performed, and patient medications and                            allergies were reviewed. The patient's tolerance of                            previous anesthesia was also reviewed. The risks                            and benefits of the procedure and the sedation                            options and risks were discussed with the patient.                            All questions were answered, and informed consent                            was obtained. Prior Anticoagulants: The patient has                            taken Plavix (clopidogrel), last dose was 5 days                            prior to procedure. ASA Grade Assessment: II - A                            patient with mild systemic disease. After reviewing                            the risks and benefits, the patient was deemed in                            satisfactory condition to undergo the procedure.  After obtaining informed consent, the endoscope was                            passed under direct vision. Throughout the                            procedure, the patient's blood pressure, pulse, and                            oxygen saturations were monitored continuously. The                            Endoscope was introduced through the mouth, and                            advanced to the  third part of duodenum. The upper                            GI endoscopy was accomplished without difficulty.                            The patient tolerated the procedure well. Scope In: Scope Out: Findings:                 The examined esophagus was normal. Biopsies were                            obtained from the mid/proximal and distal esophagus                            with cold forceps for histology.                           The Z-line was regular and was found 39 cm from the                            incisors. There was mild congesting at the z-line.                           A single 4 mm sessile polyp with no bleeding and no                            stigmata of recent bleeding was found in the                            gastric body. Biopsies were taken from the antrum,                            body, and fundus with a cold forceps for histology.                            Estimated blood loss was minimal.  Diffuse mild inflammation characterized by                            granularity was found in the gastric body and in                            the gastric antrum. A single gastric erosion was                            present. Biopsies were taken with a cold forceps                            for histology. Estimated blood loss was minimal.                           The examined duodenum was normal. Complications:            No immediate complications. Estimated blood loss:                            Minimal. Estimated Blood Loss:     Estimated blood loss was minimal. Impression:               - Normal esophagus. Biopsied.                           - Z-line regular, 39 cm from the incisors.                           - A single gastric polyp. Biopsied.                           - Gastritis and a gastric erosion. Biopsied.                           - Normal examined duodenum. Recommendation:           - Patient has a contact number  available for                            emergencies. The signs and symptoms of potential                            delayed complications were discussed with the                            patient. Return to normal activities tomorrow.                            Written discharge instructions were provided to the                            patient.                           - Resume previous diet.                           -  Continue present medications.                           - Await pathology results.                           - No aspirin, ibuprofen, naproxen, or other                            non-steroidal anti-inflammatory drugs.                           - Proceed with colonoscopy as previously planned. Thornton Park MD, MD 08/05/2020 9:15:50 AM This report has been signed electronically.

## 2020-08-05 NOTE — Progress Notes (Signed)
Vs in adm by CW 

## 2020-08-07 ENCOUNTER — Telehealth: Payer: Self-pay | Admitting: *Deleted

## 2020-08-07 NOTE — Telephone Encounter (Signed)
  Follow up Call-  Call back number 08/05/2020  Post procedure Call Back phone  # [phone]  Permission to leave phone message Yes  Some recent data might be hidden     Patient questions:  Do you have a fever, pain , or abdominal swelling? No. Pain Score  0 *  Have you tolerated food without any problems? Yes.    Have you been able to return to your normal activities? Yes.    Do you have any questions about your discharge instructions: Diet   No. Medications  No. Follow up visit  No.  Do you have questions or concerns about your Care? No.  Actions: * If pain score is 4 or above: 1. No action needed, pain <4.Have you developed a fever since your procedure? no  2.   Have you had an respiratory symptoms (SOB or cough) since your procedure? no  3.   Have you tested positive for COVID 19 since your procedure no  4.   Have you had any family members/close contacts diagnosed with the COVID 19 since your procedure?  no   If yes to any of these questions please route to Joylene John, RN and Joella Prince, RN

## 2020-08-11 DIAGNOSIS — Z6831 Body mass index (BMI) 31.0-31.9, adult: Secondary | ICD-10-CM | POA: Diagnosis not present

## 2020-08-11 DIAGNOSIS — I10 Essential (primary) hypertension: Secondary | ICD-10-CM | POA: Diagnosis not present

## 2020-08-11 DIAGNOSIS — I679 Cerebrovascular disease, unspecified: Secondary | ICD-10-CM | POA: Diagnosis not present

## 2020-08-11 DIAGNOSIS — H8102 Meniere's disease, left ear: Secondary | ICD-10-CM | POA: Diagnosis not present

## 2020-08-11 DIAGNOSIS — E785 Hyperlipidemia, unspecified: Secondary | ICD-10-CM | POA: Diagnosis not present

## 2020-08-11 DIAGNOSIS — E669 Obesity, unspecified: Secondary | ICD-10-CM | POA: Diagnosis not present

## 2020-08-11 DIAGNOSIS — Z8673 Personal history of transient ischemic attack (TIA), and cerebral infarction without residual deficits: Secondary | ICD-10-CM | POA: Diagnosis not present

## 2020-08-11 DIAGNOSIS — G4733 Obstructive sleep apnea (adult) (pediatric): Secondary | ICD-10-CM | POA: Diagnosis not present

## 2020-08-20 ENCOUNTER — Encounter: Payer: Self-pay | Admitting: Gastroenterology

## 2020-09-16 DIAGNOSIS — L570 Actinic keratosis: Secondary | ICD-10-CM | POA: Diagnosis not present

## 2020-09-16 DIAGNOSIS — L578 Other skin changes due to chronic exposure to nonionizing radiation: Secondary | ICD-10-CM | POA: Diagnosis not present

## 2020-09-16 DIAGNOSIS — Z85828 Personal history of other malignant neoplasm of skin: Secondary | ICD-10-CM | POA: Diagnosis not present

## 2020-09-16 DIAGNOSIS — L905 Scar conditions and fibrosis of skin: Secondary | ICD-10-CM | POA: Diagnosis not present

## 2020-09-19 ENCOUNTER — Other Ambulatory Visit: Payer: Self-pay

## 2020-09-19 MED ORDER — CLOPIDOGREL BISULFATE 75 MG PO TABS: 75.0000 mg | ORAL_TABLET | Freq: Every day | ORAL | 0 refills | Status: DC

## 2020-09-19 MED ORDER — CHLORTHALIDONE 25 MG PO TABS: 25.0000 mg | ORAL_TABLET | Freq: Every day | ORAL | 0 refills | Status: DC

## 2020-09-19 MED ORDER — AMLODIPINE BESYLATE 5 MG PO TABS: 1.0000 | ORAL_TABLET | Freq: Every day | ORAL | 0 refills | Status: DC

## 2020-09-19 MED ORDER — PRAVASTATIN SODIUM 40 MG PO TABS: 40.0000 mg | ORAL_TABLET | Freq: Every day | ORAL | 0 refills | Status: DC

## 2020-09-19 NOTE — Telephone Encounter (Addendum)
Spoke to patient advised he needs appointment with Dr.Hochrein.Appointment scheduled with Dr.Hochrein 6/20 at 10:20 am.Refills sent to pharmacy.

## 2020-10-19 DIAGNOSIS — E041 Nontoxic single thyroid nodule: Secondary | ICD-10-CM | POA: Insufficient documentation

## 2020-10-19 NOTE — Progress Notes (Signed)
Cardiology Office Note   Date:  10/20/2020   ID:  Jesus Juarez, DOB 04-10-1951, MRN [id number]  PCP:  Rory Percy, MD  Cardiologist:   Minus Breeding, MD   Chief Complaint  Patient presents with   Palpitations       History of Present Illness: Jesus Juarez is a 70 y.o. male who presents for evaluation of hypertension.  He has a history of a normal cath 20 yrs ago. His last nuclear was in 2017 with low risk study. He had palpitations in 10/2017 and an event monitor with SR/ST. In ER in Feb 2020 he had hand numbness other symptoms suggestive of a possible TIA.  CT head was normal and MRI of brian without acute abnormality.     He has seen a neurologist.  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   He had an angiogram.    The L MCA had complete occlusion.   He had a 75% stenosis of the R MCA prox M1 segment as well.  He is being treated for sleep apnea.    He has been doing relatively well since I last saw him.  He does some walking and yard work.  He will get occasional mild shortness of breath where he feels like he has to take a deep breath but he is not describing PND or orthopnea.  Some very rare fluttering but this is not any different than it was previously.  He has had no residual neurologic events such as his previous TIA.  He denies any chest pressure, neck or arm discomfort.  Has had no weight gain or edema.  The dizziness that he was having seems to be improved.  He has had 1 episode of vertigo recently.  This is different than the previous dizziness he described.   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   History of cardiac catheterization    Normal coronaries - approximately 20 years ago   HLD (hyperlipidemia)    Hypertension    Obstructive sleep apnea    CPAP   Vitamin D deficiency     Past Surgical History:  Procedure Laterality Date   IR ANGIO INTRA EXTRACRAN SEL  COM CAROTID INNOMINATE BILAT MOD SED  10/03/2018   IR ANGIO VERTEBRAL SEL SUBCLAVIAN INNOMINATE UNI L MOD SED  10/03/2018   IR ANGIO VERTEBRAL SEL VERTEBRAL UNI R MOD SED  10/03/2018   IR US GUIDE VASC ACCESS RIGHT  10/03/2018     Current Outpatient Medications  Medication Sig Dispense Refill   B Complex Vitamins (VITAMIN-B COMPLEX PO) Take 1 tablet by mouth daily.      cholecalciferol (VITAMIN D) 1000 units tablet Take 1,000 Units by mouth 2 (two) times daily.     Multiple Vitamin (MULTIVITAMIN) tablet Take 1 tablet by mouth daily.     vitamin C (ASCORBIC ACID) 500 MG tablet Take 500 mg by mouth daily.     amLODipine (NORVASC) 5 MG tablet Take 1 tablet (5 mg total) by mouth daily. 100 tablet 0   chlorthalidone (HYGROTON) 25 MG tablet Take 1 tablet (25 mg total) by mouth daily. 100 tablet 0   clopidogrel (PLAVIX) 75 MG tablet Take 1 tablet (75 mg total) by mouth daily. 100 tablet 0   pravastatin (PRAVACHOL) 40 MG tablet Take 1 tablet (40 mg total) by mouth daily. 100 tablet 0  No current facility-administered medications for this visit.    Allergies:   Penicillins and Elemental sulfur    ROS:  Please see the history of present illness.   Otherwise, review of systems are positive for none.   All other systems are reviewed and negative.    PHYSICAL EXAM: VS:  BP (!) 154/82   Pulse 77   Ht 5\' 11"  (1.803 m)   Wt 228 lb 6.4 oz (103.6 kg)   SpO2 99%   BMI 31.86 kg/m  , BMI Body mass index is 31.86 kg/m. GENERAL:  Well appearing NECK:  No jugular venous distention, waveform within normal limits, carotid upstroke brisk and symmetric, no bruits, no thyromegaly LUNGS:  Clear to auscultation bilaterally CHEST:  Unremarkable HEART:  PMI not displaced or sustained,S1 and S2 within normal limits, no S3, no S4, no clicks, no rubs, no murmurs ABD:  Flat, positive bowel sounds normal in frequency in pitch, no bruits, no rebound, no guarding, no midline pulsatile mass, no hepatomegaly, no  splenomegaly EXT:  2 plus pulses throughout, no edema, no cyanosis no clubbing   EKG:  EKG is not ordered today. Sinus rhythm, rate 63, axis within normal limits, RSR prime in V1, early transition lead V2, no acute ST-T wave changes.  06/20/2020  Recent Labs: 04/30/2020: Creatinine, Ser 1.30    Lipid Panel    Component Value Date/Time   CHOL 188 07/06/2018 0729   TRIG 216.0 (H) 07/06/2018 0729   HDL 47.00 07/06/2018 0729   CHOLHDL 4 07/06/2018 0729   VLDL 43.2 (H) 07/06/2018 0729   LDLDIRECT 123.0 07/06/2018 0729      Wt Readings from Last 3 Encounters:  10/20/20 228 lb 6.4 oz (103.6 kg)  08/05/20 227 lb (103 kg)  06/19/20 227 lb (103 kg)      Other studies Reviewed: Additional studies/ records that were reviewed today include: Labs Review of the above records demonstrates: See elsewhere  ASSESSMENT AND PLAN:   DIZZINESS:     This has resolved.  No change in therapy.   HTN: The blood pressure is elevated today but this is quite unusual.  No change in therapy.  We will keep an eye on this at home.  NEUROVASCULAR DISEASE:   He has had no new complaints.  DYSLIPIDEMIA:    His goal LDL is mildly elevated last year and is going to be repeated next month.  I suggested 80 mg of pravastatin if his LDL is not less than 70.   SLEEP APNEA:   He is having this managed by Dr. Claiborne Billings.  He has had some adjustments and is maybe a little less fatigued but not completely improved but they are still working on this.  We will see if his fatigue improves with optimal settings.  THYROID NODULES:   He needs follow up ultrasound this year and I will order this in 6 months   Current medicines are reviewed at length with the patient today.  The patient does not have concerns regarding medicines.  The following changes have been made:  None  Labs/ tests ordered today include:    Orders Placed This Encounter  Procedures   US THYROID      Disposition:   FU with me in 12 months.       Signed, Minus Breeding, MD  10/20/2020 11:22 AM    [facility]

## 2020-10-20 ENCOUNTER — Ambulatory Visit: Payer: Medicare HMO | Admitting: Cardiology

## 2020-10-20 ENCOUNTER — Other Ambulatory Visit: Payer: Self-pay

## 2020-10-20 ENCOUNTER — Encounter: Payer: Self-pay | Admitting: Cardiology

## 2020-10-20 VITALS — BP 154/82 | HR 77 | Ht 71.0 in | Wt 228.4 lb

## 2020-10-20 DIAGNOSIS — E785 Hyperlipidemia, unspecified: Secondary | ICD-10-CM | POA: Diagnosis not present

## 2020-10-20 DIAGNOSIS — I10 Essential (primary) hypertension: Secondary | ICD-10-CM

## 2020-10-20 DIAGNOSIS — R42 Dizziness and giddiness: Secondary | ICD-10-CM | POA: Diagnosis not present

## 2020-10-20 DIAGNOSIS — E041 Nontoxic single thyroid nodule: Secondary | ICD-10-CM | POA: Diagnosis not present

## 2020-10-20 MED ORDER — AMLODIPINE BESYLATE 5 MG PO TABS
1.0000 | ORAL_TABLET | Freq: Every day | ORAL | 0 refills | Status: DC
Start: 2020-10-20 — End: 2021-03-05

## 2020-10-20 MED ORDER — PRAVASTATIN SODIUM 40 MG PO TABS: 40.0000 mg | ORAL_TABLET | Freq: Every day | ORAL | 0 refills | Status: DC

## 2020-10-20 MED ORDER — CLOPIDOGREL BISULFATE 75 MG PO TABS: 75.0000 mg | ORAL_TABLET | Freq: Every day | ORAL | 0 refills | Status: DC

## 2020-10-20 MED ORDER — CHLORTHALIDONE 25 MG PO TABS: 25.0000 mg | ORAL_TABLET | Freq: Every day | ORAL | 0 refills | Status: DC

## 2020-10-20 NOTE — Patient Instructions (Signed)
Medication Instructions:  The current medical regimen is effective;  continue present plan and medications.  *If you need a refill on your cardiac medications before your next appointment, please call your pharmacy*   Testing/Procedures: Thyroid Ultrasound (6 months)    Follow-Up: At [facility], you and your health needs are our priority.  As part of our continuing mission to provide you with exceptional heart care, we have created designated Provider Care Teams.  These Care Teams include your primary Cardiologist (physician) and Advanced Practice Providers (APPs -  Physician Assistants and Nurse Practitioners) who all work together to provide you with the care you need, when you need it.  We recommend signing up for the patient portal called "MyChart".  Sign up information is provided on this After Visit Summary.  MyChart is used to connect with patients for Virtual Visits (Telemedicine).  Patients are able to view lab/test results, encounter notes, upcoming appointments, etc.  Non-urgent messages can be sent to your provider as well.   To learn more about what you can do with MyChart, go to [email or web address].    Your next appointment:   12 month(s)  The format for your next appointment:   In Person  Provider:   Minus Breeding, MD

## 2020-10-21 ENCOUNTER — Encounter: Payer: Self-pay | Admitting: Gastroenterology

## 2020-10-21 ENCOUNTER — Ambulatory Visit: Payer: Medicare HMO | Admitting: Gastroenterology

## 2020-10-21 VITALS — BP 134/70 | HR 80 | Ht 70.5 in | Wt 227.4 lb

## 2020-10-21 DIAGNOSIS — K219 Gastro-esophageal reflux disease without esophagitis: Secondary | ICD-10-CM

## 2020-10-21 DIAGNOSIS — K2970 Gastritis, unspecified, without bleeding: Secondary | ICD-10-CM | POA: Diagnosis not present

## 2020-10-21 DIAGNOSIS — K5730 Diverticulosis of large intestine without perforation or abscess without bleeding: Secondary | ICD-10-CM | POA: Diagnosis not present

## 2020-10-21 DIAGNOSIS — K5010 Crohn's disease of large intestine without complications: Secondary | ICD-10-CM

## 2020-10-21 DIAGNOSIS — K2990 Gastroduodenitis, unspecified, without bleeding: Secondary | ICD-10-CM | POA: Diagnosis not present

## 2020-10-21 MED ORDER — METRONIDAZOLE 500 MG PO TABS: 500.0000 mg | ORAL_TABLET | Freq: Three times a day (TID) | ORAL | 0 refills | Status: AC

## 2020-10-21 MED ORDER — CIPROFLOXACIN HCL 500 MG PO TABS: 500.0000 mg | ORAL_TABLET | Freq: Two times a day (BID) | ORAL | 0 refills | Status: AC

## 2020-10-21 NOTE — Patient Instructions (Signed)
It was my pleasure to provide care to you you today. Based on our discussion, I am providing you with my recommendations below:  RECOMMENDATION(S):   I am recommending that we repeat your colonoscopy in 2029  I am recommending Pepcid 20mg  2 times daily. However, you have requested to continue using Apple Cider Vinegar to treat your symptoms  PRESCRIPTION MEDICATION(S):   We have sent the following medication(s) to your pharmacy:  Ciprofloxacin - please take 500mg  by mouth 2 times daily x 14 days, then stop Metronidazole - please take 500mg  by mouth 3 times daily x 14 days, then stop  NOTE: If your medication(s) requires a PRIOR AUTHORIZATION, we will receive notification from your pharmacy. Once received, the process to submit for approval may take up to 7-10 business days. You will be contacted about any denials we have received from your insurance company as well as alternatives recommended by your provider.   FOLLOW UP:  I would like for you to follow up with me if your symptoms worsen. Please call the office at [phone] to schedule your appointment.  BMI:  If you are age 69 or older, your body mass index should be between 23-30. Your Body mass index is 32.16 kg/m. If this is out of the aforementioned range listed, please consider follow up with your Primary Care Provider.  If you are age 13 or younger, your body mass index should be between 19-25. Your Body mass index is 32.16 kg/m. If this is out of the aformentioned range listed, please consider follow up with your Primary Care Provider.   MY CHART:  The [facility] providers would like to encourage you to use [facility] to communicate with providers for non-urgent requests or questions.  Due to long hold times on the telephone, sending your provider a message by [facility] may be a faster and more efficient way to get a response.  Please allow 48 business hours for a response.  Please remember that this is for non-urgent  requests.   Thank you for trusting me with your gastrointestinal care!    Thornton Park, MD, MPH

## 2020-10-21 NOTE — Progress Notes (Signed)
Referring Provider: Rory Percy, MD Primary Care Physician:  Rory Percy, MD  Chief complaint: Positive Cologuard   IMPRESSION:  Segmental colitis associated with diverticulosis Reflux and gastritis presenting with eructation and reflux x >30 years    - Previously treated with ranitidine PRN    - EGD biopsies showed reflux esophagitis and gastritis    - intestinal metaplasia on gastric biopsies 2022    - wishes to avoid prescription medications Tubular adenoma    - surveillance colonoscopy in 7 years Chronic Plavix for history of TIA in 2020 (prescribed by Dr. Posey Pronto with [facility])  Segmental colitis associated with diverticulosis: Reviewed diagnosis. Recommend 14 days of antibiotics, trial of mesalamine if bowel habits don't improve with antibiotics.  Avoid NSAIDs. Diet to promote a health gut with use of probiotics if he finds them helpful.   Gastritis and reflux: Ongoing symptoms likely related to untreated gastritis and reflux. No Barrett's Esophagus. Consider repeat EGD with gastric mapping in 3 years. Reviewed reflux lifestyle modifications. Recommended H2B or PPI, however, he prefers to control symptoms with apple cider vinegar and diet. Discussed potential natural history.    Tubular adenoma and polypoid mucosa on recent colonoscopy: Surveillance colonoscopy in 7 years  PLAN: - Ciprofloxacin 500 mg BID x 14 days, metronidaole 500 mg TID x 14 days - Trial of mesalamine 800 mg TID x 14 days if no improvement to antibiotics - Reflux lifestyle modifications - Pepcid 20 mg BID - but he prefers to use apple cider vinegar instead - Avoid NSAIDs - Discussed repopulating his colon with healthy bacteria - Colonoscopy in 2029  Please see the "Patient Instructions" section for addition details about the plan.  I spent 30 minutes, including chart review, independent review of results, communicating results with the patient directly, face-to-face time with the patient,  coordinating care, and ordering studies and medications as appropriate, and documentation.    HPI: Jesus Juarez is a 70 y.o. male initially referred for positive Cologuard. At the time of his initial consultation he also reported frequent eructation and reflux (at least 30 years of symptoms). Was using Zantac PRN until the recall. Now he avoids eating at night to minimize his symptoms.   Endoscopic evaluation was performed 08/05/20. Colonoscopy showed pancolonic diverticulosis most pronounced in the sigmoid colon, and party erythema from 25-30 cm, and 3 small colon polyps. One of the polyps was an adenoma, the other two were benign polypoid colonic mucosa.  Colon biopsies showed diverticulosis related semengtal colitis.  EGD showed gastric fundic gland polyps and gastritis. Random gastric biopsies showed intestinal metaplasia. Esophageal biopsies showed reflux.   He was asked to stop using NSAIDs and take omeprazole 40 mg daily for at least 8 weeks.   Returns today in scheduled follow-up. Denies abdominal pain or diarrhea, although notes that his stools have been mushy.  Some bloating that is new since the colonoscopy but localizes this to the upper abdomen. He also has questions about a ventral hernia. Wishes to control his bowel changes and pathology findings with diet - suggesting eating more yogurt or sauerkraut.   He is using apple cider vinegar to control his reflux. He is not interested in prescription medications.   Endoscopic history: - Colonoscopy in [place] in 2016 that patient remembers as normal - Colonoscopy 08/05/20 showed pancolonic diverticulosis most pronounced in the sigmoid colon, and party erythema from 25-30 cm, and 3 small colon polyps. One of the polyps was an adenoma, the other two were benign polypoid  colonic mucosa.  Colon biopsies showed diverticulosis related semengtal colitis.  - EGD showed gastric fundic gland polyps and gastritis. Random gastric biopsies showed intestinal  metaplasia. Esophageal biopsies showed reflux.    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   History of cardiac catheterization    Normal coronaries - approximately 20 years ago   HLD (hyperlipidemia)    Hypertension    Obstructive sleep apnea    CPAP   Vitamin D deficiency     Past Surgical History:  Procedure Laterality Date   IR ANGIO INTRA EXTRACRAN SEL COM CAROTID INNOMINATE BILAT MOD SED  10/03/2018   IR ANGIO VERTEBRAL SEL SUBCLAVIAN INNOMINATE UNI L MOD SED  10/03/2018   IR ANGIO VERTEBRAL SEL VERTEBRAL UNI R MOD SED  10/03/2018   IR US GUIDE VASC ACCESS RIGHT  10/03/2018    Current Outpatient Medications  Medication Sig Dispense Refill   amLODipine (NORVASC) 5 MG tablet Take 1 tablet (5 mg total) by mouth daily. 100 tablet 0   B Complex Vitamins (VITAMIN-B COMPLEX PO) Take 1 tablet by mouth daily.      chlorthalidone (HYGROTON) 25 MG tablet Take 1 tablet (25 mg total) by mouth daily. 100 tablet 0   cholecalciferol (VITAMIN D) 1000 units tablet Take 1,000 Units by mouth 2 (two) times daily.     clopidogrel (PLAVIX) 75 MG tablet Take 1 tablet (75 mg total) by mouth daily. 100 tablet 0   Multiple Vitamin (MULTIVITAMIN) tablet Take 1 tablet by mouth daily.     pravastatin (PRAVACHOL) 40 MG tablet Take 1 tablet (40 mg total) by mouth daily. 100 tablet 0   vitamin C (ASCORBIC ACID) 500 MG tablet Take 500 mg by mouth daily.     No current facility-administered medications for this visit.    Allergies as of 10/21/2020 - Review Complete 10/21/2020  Allergen Reaction Noted   Penicillins Hives 10/09/2015   Elemental sulfur Rash 10/11/2016      Physical Exam: General:   Alert,  well-nourished, pleasant and cooperative in NAD Head:  Normocephalic and atraumatic. Eyes:  Sclera clear, no icterus.   Conjunctiva pink. Abdomen:  Soft, nontender, nondistended, normal bowel sounds, no rebound or guarding. No hepatosplenomegaly.   Neurologic:  Alert  and  oriented x4;  grossly nonfocal Skin:  Intact without significant lesions or rashes. Psych:  Alert and cooperative. Normal mood and affect.    Nou Chard L. Tarri Glenn, MD, MPH 10/21/2020, 9:57 AM

## 2020-10-29 ENCOUNTER — Other Ambulatory Visit (HOSPITAL_COMMUNITY): Payer: Self-pay | Admitting: Interventional Radiology

## 2020-10-29 ENCOUNTER — Telehealth (HOSPITAL_COMMUNITY): Payer: Self-pay

## 2020-10-29 DIAGNOSIS — I771 Stricture of artery: Secondary | ICD-10-CM

## 2020-10-29 NOTE — Telephone Encounter (Signed)
Called to schedule cta head/neck, no answer, left vm. AW 

## 2020-10-30 ENCOUNTER — Ambulatory Visit (HOSPITAL_COMMUNITY)
Admission: RE | Admit: 2020-10-30 | Discharge: 2020-10-30 | Disposition: A | Discharge status: Home / Self Care | Payer: Medicare HMO | Source: Ambulatory Visit | Attending: Cardiology | Admitting: Cardiology

## 2020-10-30 ENCOUNTER — Other Ambulatory Visit: Payer: Self-pay

## 2020-10-30 DIAGNOSIS — E041 Nontoxic single thyroid nodule: Secondary | ICD-10-CM | POA: Insufficient documentation

## 2020-11-05 ENCOUNTER — Other Ambulatory Visit: Payer: Self-pay

## 2020-11-05 DIAGNOSIS — E041 Nontoxic single thyroid nodule: Secondary | ICD-10-CM

## 2020-11-17 ENCOUNTER — Encounter (HOSPITAL_COMMUNITY): Payer: Self-pay

## 2020-11-17 ENCOUNTER — Ambulatory Visit (HOSPITAL_COMMUNITY)
Admission: RE | Admit: 2020-11-17 | Discharge: 2020-11-17 | Disposition: A | Discharge status: Home / Self Care | Payer: Medicare HMO | Source: Ambulatory Visit | Attending: Interventional Radiology | Admitting: Interventional Radiology

## 2020-11-17 ENCOUNTER — Other Ambulatory Visit: Payer: Self-pay

## 2020-11-17 DIAGNOSIS — I771 Stricture of artery: Secondary | ICD-10-CM | POA: Diagnosis not present

## 2020-11-17 DIAGNOSIS — I63233 Cerebral infarction due to unspecified occlusion or stenosis of bilateral carotid arteries: Secondary | ICD-10-CM | POA: Diagnosis not present

## 2020-11-17 DIAGNOSIS — M4312 Spondylolisthesis, cervical region: Secondary | ICD-10-CM | POA: Diagnosis not present

## 2020-11-17 DIAGNOSIS — E041 Nontoxic single thyroid nodule: Secondary | ICD-10-CM | POA: Diagnosis not present

## 2020-11-17 DIAGNOSIS — I6603 Occlusion and stenosis of bilateral middle cerebral arteries: Secondary | ICD-10-CM | POA: Diagnosis not present

## 2020-11-17 LAB — POCT I-STAT CREATININE: Creatinine, Ser: 1.1 mg/dL (ref 0.61–1.24)

## 2020-11-17 MED ORDER — IOHEXOL 350 MG/ML SOLN
75.0000 mL | Freq: Once | INTRAVENOUS | Status: AC | PRN
  Administered 2020-11-17: 75 mL via INTRAVENOUS

## 2020-11-19 ENCOUNTER — Telehealth (HOSPITAL_COMMUNITY): Payer: Self-pay

## 2020-11-19 NOTE — Telephone Encounter (Signed)
Pt agreed to f/u in 1 year with cta head/neck. AW  

## 2020-11-27 DIAGNOSIS — E669 Obesity, unspecified: Secondary | ICD-10-CM | POA: Diagnosis not present

## 2020-11-27 DIAGNOSIS — E785 Hyperlipidemia, unspecified: Secondary | ICD-10-CM | POA: Diagnosis not present

## 2020-11-27 DIAGNOSIS — I679 Cerebrovascular disease, unspecified: Secondary | ICD-10-CM | POA: Diagnosis not present

## 2020-12-16 DIAGNOSIS — L814 Other melanin hyperpigmentation: Secondary | ICD-10-CM | POA: Diagnosis not present

## 2020-12-16 DIAGNOSIS — L821 Other seborrheic keratosis: Secondary | ICD-10-CM | POA: Diagnosis not present

## 2020-12-16 DIAGNOSIS — L570 Actinic keratosis: Secondary | ICD-10-CM | POA: Diagnosis not present

## 2020-12-16 DIAGNOSIS — L718 Other rosacea: Secondary | ICD-10-CM | POA: Diagnosis not present

## 2020-12-16 DIAGNOSIS — B351 Tinea unguium: Secondary | ICD-10-CM | POA: Diagnosis not present

## 2020-12-16 DIAGNOSIS — L905 Scar conditions and fibrosis of skin: Secondary | ICD-10-CM | POA: Diagnosis not present

## 2020-12-16 DIAGNOSIS — Z85828 Personal history of other malignant neoplasm of skin: Secondary | ICD-10-CM | POA: Diagnosis not present

## 2020-12-24 DIAGNOSIS — Z8673 Personal history of transient ischemic attack (TIA), and cerebral infarction without residual deficits: Secondary | ICD-10-CM | POA: Diagnosis not present

## 2020-12-24 DIAGNOSIS — E669 Obesity, unspecified: Secondary | ICD-10-CM | POA: Diagnosis not present

## 2020-12-24 DIAGNOSIS — R7303 Prediabetes: Secondary | ICD-10-CM | POA: Diagnosis not present

## 2020-12-24 DIAGNOSIS — E785 Hyperlipidemia, unspecified: Secondary | ICD-10-CM | POA: Diagnosis not present

## 2020-12-24 DIAGNOSIS — Z0000 Encounter for general adult medical examination without abnormal findings: Secondary | ICD-10-CM | POA: Diagnosis not present

## 2020-12-24 DIAGNOSIS — I10 Essential (primary) hypertension: Secondary | ICD-10-CM | POA: Diagnosis not present

## 2021-01-28 DIAGNOSIS — H6123 Impacted cerumen, bilateral: Secondary | ICD-10-CM | POA: Diagnosis not present

## 2021-01-30 ENCOUNTER — Other Ambulatory Visit: Payer: Self-pay | Admitting: Cardiology

## 2021-02-05 DIAGNOSIS — L298 Other pruritus: Secondary | ICD-10-CM | POA: Diagnosis not present

## 2021-02-05 DIAGNOSIS — R208 Other disturbances of skin sensation: Secondary | ICD-10-CM | POA: Diagnosis not present

## 2021-02-05 DIAGNOSIS — Z08 Encounter for follow-up examination after completed treatment for malignant neoplasm: Secondary | ICD-10-CM | POA: Diagnosis not present

## 2021-02-05 DIAGNOSIS — B079 Viral wart, unspecified: Secondary | ICD-10-CM | POA: Diagnosis not present

## 2021-02-05 DIAGNOSIS — D492 Neoplasm of unspecified behavior of bone, soft tissue, and skin: Secondary | ICD-10-CM | POA: Diagnosis not present

## 2021-02-05 DIAGNOSIS — L538 Other specified erythematous conditions: Secondary | ICD-10-CM | POA: Diagnosis not present

## 2021-02-05 DIAGNOSIS — Z85828 Personal history of other malignant neoplasm of skin: Secondary | ICD-10-CM | POA: Diagnosis not present

## 2021-02-16 ENCOUNTER — Other Ambulatory Visit: Payer: Self-pay

## 2021-02-16 ENCOUNTER — Ambulatory Visit: Payer: Medicare HMO | Admitting: Neurology

## 2021-02-16 ENCOUNTER — Encounter: Payer: Self-pay | Admitting: Neurology

## 2021-02-16 VITALS — BP 126/81 | HR 72 | Ht 70.5 in | Wt 228.0 lb

## 2021-02-16 DIAGNOSIS — H8110 Benign paroxysmal vertigo, unspecified ear: Secondary | ICD-10-CM | POA: Diagnosis not present

## 2021-02-16 DIAGNOSIS — I679 Cerebrovascular disease, unspecified: Secondary | ICD-10-CM

## 2021-02-16 NOTE — Progress Notes (Signed)
Follow-up Visit   Date: 02/16/21   Akashdeep Chuba MRN: [id number] DOB: 05/17/1950   Interim History: Jesus Juarez is a 70 y.o. left-handed Caucasian male with hypertension, OSA, and TIA (2020) returning to the clinic for follow-up of intracranial stenosis.  The patient was accompanied to the clinic by self.  History of present illness: Starting on 06/11/2018, he began having spells of tingling involving the entire right arm and numbness over the face, lasting 30-seconds and then resolving.  This occurs 1-2 times per day. There is no speech changes, vision changes, or limb weakness.   He went to the ER on 2/10 where CT head and MRI head was normal. He was recommended to take aspirin 81mg , but is not compliant with taking this due to reading side effects.  He does not have history of hypertension and blood pressure was elevated 175/88 and was started on amlodipine.  Over the next two weeks, he continued to have spells of right face, arm, and torso numbness/tingling, the longest which lasted 5 minute.  He noticed that it was always be associated with his blood pressure being elevated.     He did not tolerate statin therapy in the past and does not want to take this again.  UPDATE 02/15/2020:  He is here for follow-up visit.  He has been doing well and denies any vision changes, numbness/tingling, or weakness.  He has CTA head and neck from May 2021 showed left M1 occlusion with collateral flow and 50% right M1 stenosis, which is stable.  Bilateral ICA are patent. He has been compliant with taking his medication and is hoping to make lifestyle changes to reduce his blood pressure medications.  He is tolerating aspirin 81mg , plavix 75mg , and pravastatin 40mg .   UPDATE 02/16/2021:  He is here for 1-year follow-up.  He has been doing fairly well.  His aspirin was stopped over the past year due to blood in his stool.  He remains compliant with plavix 75mg  and pravastatin.  He had repeat CTA  head and neck by Dr. Estanislado Pandy which shows stable left M1 occlusion and 50% narrowing of the right M1. About two days ago, he began having vertigo, which he has experienced multiple times in the past, because of this, his balance is off.  It typically improved within a few days. He also complains of shortness of breath with exertion, and needs to take breaks when walking.   Medications:  Current Outpatient Medications on File Prior to Visit  Medication Sig Dispense Refill   amLODipine (NORVASC) 5 MG tablet Take 1 tablet (5 mg total) by mouth daily. 100 tablet 0   B Complex Vitamins (VITAMIN-B COMPLEX PO) Take 1 tablet by mouth daily.      chlorthalidone (HYGROTON) 25 MG tablet Take 1 tablet (25 mg total) by mouth daily. 100 tablet 0   cholecalciferol (VITAMIN D) 1000 units tablet Take 1,000 Units by mouth 2 (two) times daily.     clopidogrel (PLAVIX) 75 MG tablet Take 1 tablet (75 mg total) by mouth daily. 100 tablet 0   Multiple Vitamin (MULTIVITAMIN) tablet Take 1 tablet by mouth daily.     pravastatin (PRAVACHOL) 40 MG tablet TAKE 1 TABLET BY MOUTH EVERY DAY 100 tablet 0   vitamin C (ASCORBIC ACID) 500 MG tablet Take 500 mg by mouth daily.     No current facility-administered medications on file prior to visit.    Allergies:  Allergies  Allergen Reactions   Penicillins Hives  Did it involve swelling of the face/tongue/throat, SOB, or low BP? No Did it involve sudden or severe rash/hives, skin peeling, or any reaction on the inside of your mouth or nose? Yes Did you need to seek medical attention at a hospital or doctor's office? Unknown When did it last happen?       If all above answers are "NO", may proceed with cephalosporin use.    Elemental Sulfur Rash    Vital Signs:  BP 126/81   Pulse 72   Ht 5' 10.5" (1.791 m)   Wt 228 lb (103.4 kg)   SpO2 100%   BMI 32.25 kg/m     Neurological Exam: MENTAL STATUS including orientation to time, place, person, recent and remote  memory, attention span and concentration, language, and fund of knowledge is normal.  Speech is not dysarthric.  CRANIAL NERVES:    Pupils equal round and reactive to light.  Normal conjugate, extra-ocular eye movements in all directions of gaze. Mild end-point nystagmus to the right.  No ptosis.  Face is symmetric.   MOTOR:  Motor strength is 5/5 in all extremities.  No atrophy, fasciculations or abnormal movements.  No pronator drift.  Tone is normal.    MSRs:  Reflexes are 2+/4 throughout.  SENSORY:  Intact to vibration throughout.  Rhomberg testing positive.   COORDINATION/GAIT:  Normal finger-to- nose-finger.  Intact rapid alternating movements bilaterally.  Gait mildly wide-based and stable.  Data:  Lab Results  Component Value Date   CHOL 188 07/06/2018   HDL 47.00 07/06/2018   LDLDIRECT 123.0 07/06/2018   TRIG 216.0 (H) 07/06/2018   CHOLHDL 4 07/06/2018    CTA head and neck 09/20/2019: Aortic Atherosclerosis (ICD10-I70.0).   Both carotid bifurcations widely patent. No stenosis or atherosclerotic disease seen on the right. Minimal soft plaque at the bifurcation on the left with a small lateral ulceration but no stenosis.   Mild atherosclerotic irregularity in both carotid siphon regions.   Redemonstration of occlusion of the left M1 segment with distal collateral vessels.   Redemonstration of 50% stenosis of the right M1 segment, not visibly progressive since the previous study.   Several thyroid nodules. The largest is 16 mm at the lower pole on the left. Recommend thyroid US (ref: J Am Coll Radiol. 2015 Feb;12(2): 143-50).  MRI brain wo contrast 06/12/2018:  IMPRESSION: 1. Negative brain MRI. No acute intracranial abnormality identified. 2. Left mastoid effusion.   CT head wo contrast 06/12/2018:  Normal  CTA head and neck 11/17/2020: 1. Stable appearance of proximal left M1 segment occlusion. 2. Stable 50% narrowing of the proximal right M1 occlusion. 3. Collateral  left MCA vessels are stable. 4. No other significant proximal stenosis, aneurysm, or branch vessel occlusion within the Circle of Willis. 5. Mild atherosclerotic changes in the neck are stable. 6. Stable heterogeneous nodule posterior left thyroid is stable and is being monitored by ultrasound. This has been evaluated on previous imaging. (ref: J Am Coll Radiol. 2015 Feb;12(2): 143-50). 7. Aortic Atherosclerosis (ICD10-I70.0).  IMPRESSION/PLAN: Transient ischemic attack (2020) due to intracranial stenosis, no recurrent events.  He has known left M1 occlusion and 50% stenosis of the right M1, which has been stable on imaging - most recent CTA from 10/2020.   - Continue plavix 75mg  daily.  He is not on aspirin due to GI bleed  - Continue pravastatin 40mg  daily (LDL is elevated, 123)  - Encouraged healthy lifestyle changes  2.  Vertigo, most likely BPPV  - Meclizine  declined  - patient will continue to manage symptomatically  3.  Dyspnea on exertion  - Follow-up with cardiology  Return to clinic in 1 year  Thank you for allowing me to participate in patient's care.  If I can answer any additional questions, I would be pleased to do so.    Sincerely,    Emilyann Banka K. Posey Pronto, DO

## 2021-02-16 NOTE — Patient Instructions (Signed)
Return to clinic in 1 year.

## 2021-02-19 NOTE — Progress Notes (Signed)
Cardiology Office Note   Date:  02/20/2021   ID:  Jesus Juarez, DOB [birth year], MRN [id number]  PCP:  Kathyrn Lass, MD  Cardiologist:   Minus Breeding, MD   Chief Complaint  Patient presents with   Shortness of Breath        History of Present Illness: Jesus Juarez is a 70 y.o. male who presents for evaluation of hypertension.  He has a history of a normal cath 20 yrs ago. His last nuclear was in 2017 with low risk study. He had palpitations in 10/2017 and an event monitor with SR/ST. In ER in Feb 2020 he had hand numbness other symptoms suggestive of a possible TIA.  CT head was normal and MRI of brian without acute abnormality.     He has seen a neurologist.  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   He had an angiogram.    The L MCA had complete occlusion.   He had a 75% stenosis of the R MCA prox M1 segment as well.  He is being treated for sleep apnea.    Since I last saw him he has had increasing shortness of breath.  He is getting short of breath walking on level ground.  This is new onset.  There is some chest discomfort.  Some of it is right-sided.  Some of it is substernal.  It happens with exertion and does go away with rest.  There is no radiation to his jaw or to his arms.  He is not having associated nausea vomiting or diaphoresis.  He is not describing PND or orthopnea.  He has no palpitations.  He does have dizziness and cannot walk with his eyes closed.  He had an episode of vertigo on Saturday.   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   History of cardiac catheterization    Normal coronaries - approximately 20 years ago   HLD (hyperlipidemia)    Hypertension    Obstructive sleep apnea    CPAP   Vitamin D deficiency     Past Surgical History:  Procedure Laterality Date   IR ANGIO INTRA EXTRACRAN SEL COM CAROTID INNOMINATE BILAT MOD SED  10/03/2018   IR  ANGIO VERTEBRAL SEL SUBCLAVIAN INNOMINATE UNI L MOD SED  10/03/2018   IR ANGIO VERTEBRAL SEL VERTEBRAL UNI R MOD SED  10/03/2018   IR US GUIDE VASC ACCESS RIGHT  10/03/2018     Current Outpatient Medications  Medication Sig Dispense Refill   amLODipine (NORVASC) 5 MG tablet Take 1 tablet (5 mg total) by mouth daily. 100 tablet 0   B Complex Vitamins (VITAMIN-B COMPLEX PO) Take 1 tablet by mouth daily.      cholecalciferol (VITAMIN D) 1000 units tablet Take 1,000 Units by mouth 2 (two) times daily.     clopidogrel (PLAVIX) 75 MG tablet Take 1 tablet (75 mg total) by mouth daily. 100 tablet 0   metoprolol tartrate (LOPRESSOR) 100 MG tablet TAKE 1 TABLET 2 HOURS PRIOR TO CT 1 tablet 0   Multiple Vitamin (MULTIVITAMIN) tablet Take 1 tablet by mouth daily.     pravastatin (PRAVACHOL) 40 MG tablet TAKE 1 TABLET BY MOUTH EVERY DAY 100 tablet 0   vitamin C (ASCORBIC ACID) 500 MG tablet Take 500 mg by mouth daily.     chlorthalidone (HYGROTON) 25 MG tablet Take 1 tablet (  25 mg total) by mouth daily. 100 tablet 0   No current facility-administered medications for this visit.    Allergies:   Penicillins and Elemental sulfur    ROS:  Please see the history of present illness.   Otherwise, review of systems are positive for none.   All other systems are reviewed and negative.    PHYSICAL EXAM: VS:  BP 125/75   Pulse 70   Ht 5\' 10"  (1.778 m)   Wt 229 lb 6.4 oz (104.1 kg)   SpO2 95%   BMI 32.92 kg/m  , BMI Body mass index is 32.92 kg/m. GENERAL:  Well appearing NECK:  No jugular venous distention, waveform within normal limits, carotid upstroke brisk and symmetric, no bruits, no thyromegaly LUNGS:  Clear to auscultation bilaterally CHEST:  Unremarkable HEART:  PMI not displaced or sustained,S1 and S2 within normal limits, no S3, no S4, no clicks, no rubs, no murmurs ABD:  Flat, positive bowel sounds normal in frequency in pitch, no bruits, no rebound, no guarding, no midline pulsatile mass, no  hepatomegaly, no splenomegaly EXT:  2 plus pulses throughout, no edema, no cyanosis no clubbing    EKG:  EKG is  ordered today. Sinus rhythm, rate 70, axis within normal limits, RSR prime in V1, early transition lead V2, no acute ST-T wave changes.  06/20/2020  Recent Labs: 11/17/2020: Creatinine, Ser 1.10    Lipid Panel    Component Value Date/Time   CHOL 188 07/06/2018 0729   TRIG 216.0 (H) 07/06/2018 0729   HDL 47.00 07/06/2018 0729   CHOLHDL 4 07/06/2018 0729   VLDL 43.2 (H) 07/06/2018 0729   LDLDIRECT 123.0 07/06/2018 0729      Wt Readings from Last 3 Encounters:  02/20/21 229 lb 6.4 oz (104.1 kg)  02/16/21 228 lb (103.4 kg)  10/21/20 227 lb 6 oz (103.1 kg)      Other studies Reviewed: Additional studies/ records that were reviewed today include: None Review of the above records demonstrates: N/A  ASSESSMENT AND PLAN:  SOB: Patient's shortness of breath and chest discomfort constant.  Possibly new onset exertional or unstable angina coronary CTA is indicated.  He has a high pretest probability of obstructive coronary disease.  I do not know that he be able walk on a treadmill adequately.  DIZZINESS:   This is unchanged from previous.  No further cardiac work-up.  HTN: The blood pressure is well controlled.  No change in therapy.   NEUROVASCULAR DISEASE:   He has had previous work-up as above and remains on Plavix.    DYSLIPIDEMIA:    His goal LDL is mildly elevated and further goals of therapy will be pending the results of the CT.   SLEEP APNEA:   He is having this managed by Dr. Claiborne Billings.    THYROID NODULES:   He needs follow up ultrasound in June 2023   Current medicines are reviewed at length with the patient today.  The patient does not have concerns regarding medicines.  The following changes have been made:  None  Labs/ tests ordered today include:    Orders Placed This Encounter  Procedures   CT CORONARY MORPH W/CTA COR W/SCORE W/CA W/CM &/OR WO/CM    Basic Metabolic Panel (BMET)   EKG 12-Lead       Disposition:   FU with APP in 6 months.      Signed, Minus Breeding, MD  02/20/2021 1:26 PM    [facility]

## 2021-02-20 ENCOUNTER — Encounter: Payer: Self-pay | Admitting: Cardiology

## 2021-02-20 ENCOUNTER — Other Ambulatory Visit: Payer: Self-pay

## 2021-02-20 ENCOUNTER — Ambulatory Visit: Payer: Medicare HMO | Admitting: Cardiology

## 2021-02-20 VITALS — BP 125/75 | HR 70 | Ht 70.0 in | Wt 229.4 lb

## 2021-02-20 DIAGNOSIS — Z01812 Encounter for preprocedural laboratory examination: Secondary | ICD-10-CM

## 2021-02-20 DIAGNOSIS — G4730 Sleep apnea, unspecified: Secondary | ICD-10-CM | POA: Diagnosis not present

## 2021-02-20 DIAGNOSIS — E785 Hyperlipidemia, unspecified: Secondary | ICD-10-CM

## 2021-02-20 DIAGNOSIS — R079 Chest pain, unspecified: Secondary | ICD-10-CM

## 2021-02-20 DIAGNOSIS — R42 Dizziness and giddiness: Secondary | ICD-10-CM

## 2021-02-20 DIAGNOSIS — I10 Essential (primary) hypertension: Secondary | ICD-10-CM | POA: Diagnosis not present

## 2021-02-20 MED ORDER — METOPROLOL TARTRATE 100 MG PO TABS: ORAL_TABLET | ORAL | 0 refills | Status: DC

## 2021-02-20 NOTE — Patient Instructions (Addendum)
Medication Instructions:  TAKE METOPROLOL 100 MG 2 HOURS PRIOR TO CARDIAC CT  *If you need a refill on your cardiac medications before your next appointment, please call your pharmacy*  Lab Work: BMET 1 WEEK PRIOR TO CT   If you have labs (blood work) drawn today and your tests are completely normal, you will receive your results only by: [place] (if you have MyChart) OR A paper copy in the mail If you have any lab test that is abnormal or we need to change your treatment, we will call you to review the results.  Testing/Procedures: Your physician has requested that you have cardiac CT. Cardiac computed tomography (CT) is a painless test that uses an x-ray machine to take clear, detailed pictures of your heart. For further information please visit [email or web address].[email or web address]. Please follow instruction sheet as given. THE OFFICE WILL CALL YOU TO ARRANGE ONCE YOUR INSURANCE HAS BEEN REVIEWED   Follow-Up: At [facility], you and your health needs are our priority.  As part of our continuing mission to provide you with exceptional heart care, we have created designated Provider Care Teams.  These Care Teams include your primary Cardiologist (physician) and Advanced Practice Providers (APPs -  Physician Assistants and Nurse Practitioners) who all work together to provide you with the care you need, when you need it.  We recommend signing up for the patient portal called "MyChart".  Sign up information is provided on this After Visit Summary.  MyChart is used to connect with patients for Virtual Visits (Telemedicine).  Patients are able to view lab/test results, encounter notes, upcoming appointments, etc.  Non-urgent messages can be sent to your provider as well.   To learn more about what you can do with MyChart, go to [email or web address].    Your next appointment:   12 month(s)  The format for your next appointment:   In Person  Provider:   You may see Minus Breeding, MD or one of  the following Advanced Practice Providers on your designated Care Team:   Rosaria Ferries, PA-C Caron Presume, PA-C Jory Sims, DNP, ANP    Your cardiac CT will be scheduled at one of the below locations:   [facility] [address] [place], [place] [address] [phone]  [place] [address] [place], [place] [address] [phone]  If scheduled at [facility], please arrive at the [facility] main entrance (entrance A) of [facility] 30 minutes prior to test start time. You can use the FREE valet parking offered at the main entrance (encouraged to control the heart rate for the test) Proceed to the [facility] Radiology Department (first floor) to check-in and test prep.  If scheduled at [facility], please arrive 15 mins early for check-in and test prep.  Please follow these instructions carefully (unless otherwise directed):  Hold all erectile dysfunction medications at least 3 days (72 hrs) prior to test.  On the Night Before the Test: Be sure to Drink plenty of water. Do not consume any caffeinated/decaffeinated beverages or chocolate 12 hours prior to your test. Do not take any antihistamines 12 hours prior to your test. If the patient has contrast allergy: Patient will need a prescription for Prednisone and very clear instructions (as follows): Prednisone 50 mg - take 13 hours prior to test Take another Prednisone 50 mg 7 hours prior to test Take another Prednisone 50 mg 1 hour prior to test Take  Benadryl 50 mg 1 hour prior to test Patient must complete all four doses of above prophylactic medications. Patient will need a ride after test due to Benadryl.  On the Day of the Test: Drink plenty of water until 1 hour prior to the test. Do not eat any food 4 hours prior to the test. You may take your regular medications prior to the test.  Take  metoprolol (Lopressor) two hours prior to test. HOLD Furosemide/Hydrochlorothiazide morning of the test. FEMALES- please wear underwire-free bra if available, avoid dresses & tight clothing  After the Test: Drink plenty of water. After receiving IV contrast, you may experience a mild flushed feeling. This is normal. On occasion, you may experience a mild rash up to 24 hours after the test. This is not dangerous. If this occurs, you can take Benadryl 25 mg and increase your fluid intake. If you experience trouble breathing, this can be serious. If it is severe call 911 IMMEDIATELY. If it is mild, please call our office. If you take any of these medications: Glipizide/Metformin, Avandament, Glucavance, please do not take 48 hours after completing test unless otherwise instructed.  Please allow 2-4 weeks for scheduling of routine cardiac CTs. Some insurance companies require a pre-authorization which may delay scheduling of this test.   For non-scheduling related questions, please contact the cardiac imaging nurse navigator should you have any questions/concerns: Marchia Bond, Cardiac Imaging Nurse Navigator [name], Cardiac Imaging Nurse Navigator [facility] Direct Office Dial: [phone]   For scheduling needs, including cancellations and rescheduling, please call [place], [phone].  Cardiac CT Angiogram A cardiac CT angiogram is a procedure to look at the heart and the area around the heart. It may be done to help find the cause of chest pains or other symptoms of heart disease. During this procedure, a substance called contrast dye is injected into the blood vessels in the area to be checked. A large X-ray machine, called a CT scanner, then takes detailed pictures of the heart and the surrounding area. The procedure is also sometimes called a coronary CT angiogram, coronary artery scanning, or CTA. A cardiac CT angiogram allows the health care  provider to see how well blood is flowing to and from the heart. The health care provider will be able to see if there are any problems, such as: Blockage or narrowing of the coronary arteries in the heart. Fluid around the heart. Signs of weakness or disease in the muscles, valves, and tissues of the heart. Tell a health care provider about: Any allergies you have. This is especially important if you have had a previous allergic reaction to contrast dye. All medicines you are taking, including vitamins, herbs, eye drops, creams, and over-the-counter medicines. Any blood disorders you have. Any surgeries you have had. Any medical conditions you have. Whether you are pregnant or may be pregnant. Any anxiety disorders, chronic pain, or other conditions you have that may increase your stress or prevent you from lying still. What are the risks? Generally, this is a safe procedure. However, problems may occur, including: Bleeding. Infection. Allergic reactions to medicines or dyes. Damage to other structures or organs. Kidney damage from the contrast dye that is used. Increased risk of cancer from radiation exposure. This risk is low. Talk with your health care provider about: The risks and benefits of testing. How you can receive the lowest dose of radiation. What happens before the procedure? Wear comfortable clothing and remove any jewelry, glasses,  dentures, and hearing aids. Follow instructions from your health care provider about eating and drinking. This may include: For 12 hours before the procedure -- avoid caffeine. This includes tea, coffee, soda, energy drinks, and diet pills. Drink plenty of water or other fluids that do not have caffeine in them. Being well hydrated can prevent complications. For 4-6 hours before the procedure -- stop eating and drinking. The contrast dye can cause nausea, but this is less likely if your stomach is empty. Ask your health care provider about  changing or stopping your regular medicines. This is especially important if you are taking diabetes medicines, blood thinners, or medicines to treat problems with erections (erectile dysfunction). What happens during the procedure?  Hair on your chest may need to be removed so that small sticky patches called electrodes can be placed on your chest. These will transmit information that helps to monitor your heart during the procedure. An IV will be inserted into one of your veins. You might be given a medicine to control your heart rate during the procedure. This will help to ensure that good images are obtained. You will be asked to lie on an exam table. This table will slide in and out of the CT machine during the procedure. Contrast dye will be injected into the IV. You might feel warm, or you may get a metallic taste in your mouth. You will be given a medicine called nitroglycerin. This will relax or dilate the arteries in your heart. The table that you are lying on will move into the CT machine tunnel for the scan. The person running the machine will give you instructions while the scans are being done. You may be asked to: Keep your arms above your head. Hold your breath. Stay very still, even if the table is moving. When the scanning is complete, you will be moved out of the machine. The IV will be removed. The procedure may vary among health care providers and hospitals. What can I expect after the procedure? After your procedure, it is common to have: A metallic taste in your mouth from the contrast dye. A feeling of warmth. A headache from the nitroglycerin. Follow these instructions at home: Take over-the-counter and prescription medicines only as told by your health care provider. If you are told, drink enough fluid to keep your urine pale yellow. This will help to flush the contrast dye out of your body. Most people can return to their normal activities right after the  procedure. Ask your health care provider what activities are safe for you. It is up to you to get the results of your procedure. Ask your health care provider, or the department that is doing the procedure, when your results will be ready. Keep all follow-up visits as told by your health care provider. This is important. Contact a health care provider if: You have any symptoms of allergy to the contrast dye. These include: Shortness of breath. Rash or hives. A racing heartbeat. Summary A cardiac CT angiogram is a procedure to look at the heart and the area around the heart. It may be done to help find the cause of chest pains or other symptoms of heart disease. During this procedure, a large X-ray machine, called a CT scanner, takes detailed pictures of the heart and the surrounding area after a contrast dye has been injected into blood vessels in the area. Ask your health care provider about changing or stopping your regular medicines before the procedure. This  is especially important if you are taking diabetes medicines, blood thinners, or medicines to treat erectile dysfunction. If you are told, drink enough fluid to keep your urine pale yellow. This will help to flush the contrast dye out of your body. This information is not intended to replace advice given to you by your health care provider. Make sure you discuss any questions you have with your health care provider. Document Revised: 12/13/2018 Document Reviewed: 12/13/2018 Elsevier Patient Education  [place].

## 2021-03-05 ENCOUNTER — Other Ambulatory Visit: Payer: Self-pay

## 2021-03-05 MED ORDER — AMLODIPINE BESYLATE 5 MG PO TABS: 5.0000 mg | ORAL_TABLET | Freq: Every day | ORAL | 0 refills | Status: DC

## 2021-03-16 DIAGNOSIS — R079 Chest pain, unspecified: Secondary | ICD-10-CM | POA: Diagnosis not present

## 2021-03-16 DIAGNOSIS — Z01812 Encounter for preprocedural laboratory examination: Secondary | ICD-10-CM | POA: Diagnosis not present

## 2021-03-16 LAB — BASIC METABOLIC PANEL
BUN/Creatinine Ratio: 15 (ref 10–24)
BUN: 14 mg/dL (ref 8–27)
CO2: 22 mmol/L (ref 20–29)
Calcium: 9.4 mg/dL (ref 8.6–10.2)
Chloride: 98 mmol/L (ref 96–106)
Creatinine, Ser: 0.93 mg/dL (ref 0.76–1.27)
Glucose: 106 mg/dL — ABNORMAL HIGH (ref 70–99)
Potassium: 4.4 mmol/L (ref 3.5–5.2)
Sodium: 138 mmol/L (ref 134–144)
eGFR: 89 mL/min/{1.73_m2} (ref >59)

## 2021-03-18 ENCOUNTER — Telehealth (HOSPITAL_COMMUNITY): Payer: Self-pay | Admitting: *Deleted

## 2021-03-18 NOTE — Telephone Encounter (Signed)
Reaching out to patient to offer assistance regarding upcoming cardiac imaging study; pt verbalizes understanding of appt date/time, parking situation and where to check in, pre-test NPO status and medications ordered, and verified current allergies; name and call back number provided for further questions should they arise  Jesus Clement RN Navigator Cardiac Imaging [facility] [phone] office [phone] cell  Patient to take 100mg  metoprolol tartrate two hours prior to cardiac CT. He is aware to arrive at 9:30am for his 10am scan.

## 2021-03-19 DIAGNOSIS — Z20828 Contact with and (suspected) exposure to other viral communicable diseases: Secondary | ICD-10-CM | POA: Diagnosis not present

## 2021-03-19 DIAGNOSIS — Z20822 Contact with and (suspected) exposure to covid-19: Secondary | ICD-10-CM | POA: Diagnosis not present

## 2021-03-20 ENCOUNTER — Other Ambulatory Visit: Payer: Self-pay

## 2021-03-20 ENCOUNTER — Encounter (HOSPITAL_COMMUNITY): Payer: Self-pay

## 2021-03-20 ENCOUNTER — Ambulatory Visit (HOSPITAL_COMMUNITY)
Admission: RE | Admit: 2021-03-20 | Discharge: 2021-03-20 | Disposition: A | Discharge status: Home / Self Care | Payer: Medicare HMO | Source: Ambulatory Visit | Attending: Cardiology | Admitting: Cardiology

## 2021-03-20 DIAGNOSIS — I2510 Atherosclerotic heart disease of native coronary artery without angina pectoris: Secondary | ICD-10-CM | POA: Insufficient documentation

## 2021-03-20 DIAGNOSIS — I10 Essential (primary) hypertension: Secondary | ICD-10-CM | POA: Insufficient documentation

## 2021-03-20 DIAGNOSIS — R079 Chest pain, unspecified: Secondary | ICD-10-CM | POA: Insufficient documentation

## 2021-03-20 MED ORDER — NITROGLYCERIN 0.4 MG SL SUBL
0.8000 mg | SUBLINGUAL_TABLET | Freq: Once | SUBLINGUAL | Status: AC
  Administered 2021-03-20: 0.8 mg via SUBLINGUAL

## 2021-03-20 MED ORDER — IOHEXOL 350 MG/ML SOLN
100.0000 mL | Freq: Once | INTRAVENOUS | Status: AC | PRN
  Administered 2021-03-20: 100 mL via INTRAVENOUS

## 2021-03-20 MED ORDER — NITROGLYCERIN 0.4 MG SL SUBL
SUBLINGUAL_TABLET | SUBLINGUAL | Status: AC
  Filled 2021-03-20: qty 2

## 2021-03-21 DIAGNOSIS — G4733 Obstructive sleep apnea (adult) (pediatric): Secondary | ICD-10-CM | POA: Diagnosis not present

## 2021-03-23 ENCOUNTER — Ambulatory Visit (HOSPITAL_COMMUNITY)
Admission: RE | Admit: 2021-03-23 | Discharge: 2021-03-23 | Disposition: A | Discharge status: Home / Self Care | Payer: Medicare HMO | Source: Ambulatory Visit | Attending: Cardiology | Admitting: Cardiology

## 2021-03-23 ENCOUNTER — Other Ambulatory Visit: Payer: Self-pay | Admitting: Cardiology

## 2021-03-23 DIAGNOSIS — R051 Acute cough: Secondary | ICD-10-CM | POA: Diagnosis not present

## 2021-03-23 DIAGNOSIS — R931 Abnormal findings on diagnostic imaging of heart and coronary circulation: Secondary | ICD-10-CM

## 2021-03-23 DIAGNOSIS — J209 Acute bronchitis, unspecified: Secondary | ICD-10-CM | POA: Diagnosis not present

## 2021-03-23 DIAGNOSIS — R0981 Nasal congestion: Secondary | ICD-10-CM | POA: Diagnosis not present

## 2021-03-24 ENCOUNTER — Encounter: Payer: Self-pay | Admitting: Cardiology

## 2021-03-24 DIAGNOSIS — I2510 Atherosclerotic heart disease of native coronary artery without angina pectoris: Secondary | ICD-10-CM | POA: Diagnosis not present

## 2021-03-25 ENCOUNTER — Telehealth: Payer: Self-pay | Admitting: *Deleted

## 2021-03-25 ENCOUNTER — Telehealth: Payer: Self-pay

## 2021-03-25 ENCOUNTER — Encounter: Payer: Self-pay | Admitting: *Deleted

## 2021-03-25 ENCOUNTER — Other Ambulatory Visit: Payer: Self-pay | Admitting: *Deleted

## 2021-03-25 DIAGNOSIS — R931 Abnormal findings on diagnostic imaging of heart and coronary circulation: Secondary | ICD-10-CM

## 2021-03-25 LAB — CBC
Hematocrit: 46.9 % (ref 37.5–51.0)
Hemoglobin: 15.9 g/dL (ref 13.0–17.7)
MCH: 29.3 pg (ref 26.6–33.0)
MCHC: 33.9 g/dL (ref 31.5–35.7)
MCV: 87 fL (ref 79–97)
Platelets: 269 10*3/uL (ref 150–450)
RBC: 5.42 x10E6/uL (ref 4.14–5.80)
RDW: 12.5 % (ref 11.6–15.4)
WBC: 8.7 10*3/uL (ref 3.4–10.8)

## 2021-03-25 MED ORDER — SODIUM CHLORIDE 0.9% FLUSH: 3.0000 mL | Freq: Two times a day (BID) | INTRAVENOUS | Status: DC

## 2021-03-25 NOTE — Telephone Encounter (Signed)
Called patient and reviewed the following pre-procedure instructions with the patient. Patient verbalized understanding and had no questions at this time.   Cardiac catheterization scheduled at [facility] for:09:00 am [place] Entrance A [facility]) at: 07:00 am  No solid food after midnight prior to cath, clear liquids until 5 AM day of procedure.  Hold medications:  Patient is instructed to hold his [place] the morning of the procedure  Patient is instructed to take his Plavix dose the morning of the procedure  Usual morning medications can be taken pre-cath with sips of water including aspirin 81 mg.    Confirmed patient has responsible adult to drive home post procedure and be with patient first 24 hours after arriving home.  [facility] does allow one visitor to accompany you and wait in the hospital waiting room while you are there for your procedure. You and your visitor will be asked to wear a mask once you enter the hospital.  Patient reports does not currently have any new symptoms concerning for COVID-19 and no household members with COVID-19 like illness.

## 2021-03-25 NOTE — Telephone Encounter (Signed)
-----   Message from Minus Breeding, MD sent at 03/25/2021  9:50 AM EST ----- I called the patient and he has high grade coronary disease.  He needs a cardiac cath.  The patient understands that risks included but are not limited to stroke (1 in 1000), death (1 in 57), kidney failure [usually temporary] (1 in 500), bleeding (1 in 200), allergic reaction [possibly serious] (1 in 200).  The patient understands and agrees to proceed.   I would like to do this on Friday 11/25

## 2021-03-25 NOTE — Telephone Encounter (Signed)
Spoke with pt, cath instructions discussed in detail with the patient. He will come by the office today for CBC. He had a BMP on 03/16/21. Written instructions sent to patient via my chart. All questions answered to patient satisfaction. Orders placed for cath and labs.

## 2021-03-25 NOTE — Telephone Encounter (Signed)
Jesus Juarez is returning Jesus Juarez's call.

## 2021-03-25 NOTE — Telephone Encounter (Signed)
Left message for pt to call to discuss cath instructions.

## 2021-03-27 ENCOUNTER — Ambulatory Visit (HOSPITAL_COMMUNITY)
Admission: RE | Admit: 2021-03-27 | Discharge: 2021-03-27 | Disposition: A | Discharge status: Home / Self Care | Payer: Medicare HMO | Attending: Cardiovascular Disease | Admitting: Cardiovascular Disease

## 2021-03-27 ENCOUNTER — Encounter (HOSPITAL_COMMUNITY): Payer: Self-pay | Admitting: Cardiovascular Disease

## 2021-03-27 ENCOUNTER — Telehealth: Payer: Self-pay | Admitting: Physician Assistant

## 2021-03-27 ENCOUNTER — Encounter (HOSPITAL_COMMUNITY)
Admission: RE | Disposition: A | Discharge status: Home / Self Care | Payer: Self-pay | Source: Home / Self Care | Attending: Cardiovascular Disease

## 2021-03-27 ENCOUNTER — Other Ambulatory Visit (HOSPITAL_COMMUNITY): Payer: Self-pay

## 2021-03-27 DIAGNOSIS — R931 Abnormal findings on diagnostic imaging of heart and coronary circulation: Secondary | ICD-10-CM | POA: Diagnosis present

## 2021-03-27 DIAGNOSIS — I10 Essential (primary) hypertension: Secondary | ICD-10-CM | POA: Diagnosis not present

## 2021-03-27 DIAGNOSIS — Z955 Presence of coronary angioplasty implant and graft: Secondary | ICD-10-CM | POA: Insufficient documentation

## 2021-03-27 DIAGNOSIS — I25118 Atherosclerotic heart disease of native coronary artery with other forms of angina pectoris: Secondary | ICD-10-CM

## 2021-03-27 DIAGNOSIS — G4733 Obstructive sleep apnea (adult) (pediatric): Secondary | ICD-10-CM | POA: Diagnosis not present

## 2021-03-27 DIAGNOSIS — Z7902 Long term (current) use of antithrombotics/antiplatelets: Secondary | ICD-10-CM | POA: Diagnosis not present

## 2021-03-27 DIAGNOSIS — E785 Hyperlipidemia, unspecified: Secondary | ICD-10-CM | POA: Diagnosis not present

## 2021-03-27 DIAGNOSIS — R0602 Shortness of breath: Secondary | ICD-10-CM | POA: Insufficient documentation

## 2021-03-27 HISTORY — PX: LEFT HEART CATH AND CORONARY ANGIOGRAPHY: CATH118249

## 2021-03-27 HISTORY — DX: Atherosclerotic heart disease of native coronary artery without angina pectoris: I25.10

## 2021-03-27 HISTORY — DX: Cerebrovascular disease, unspecified: I67.9

## 2021-03-27 HISTORY — PX: CORONARY STENT INTERVENTION: CATH118234

## 2021-03-27 LAB — POCT ACTIVATED CLOTTING TIME: Activated Clotting Time: 306 seconds

## 2021-03-27 SURGERY — LEFT HEART CATH AND CORONARY ANGIOGRAPHY
Anesthesia: LOCAL

## 2021-03-27 MED ORDER — VERAPAMIL HCL 2.5 MG/ML IV SOLN
INTRAVENOUS | Status: DC | PRN
  Administered 2021-03-27: 10 mL via INTRA_ARTERIAL

## 2021-03-27 MED ORDER — VERAPAMIL HCL 2.5 MG/ML IV SOLN
INTRAVENOUS | Status: AC
  Filled 2021-03-27: qty 2

## 2021-03-27 MED ORDER — HEPARIN SODIUM (PORCINE) 1000 UNIT/ML IJ SOLN
INTRAMUSCULAR | Status: DC | PRN
  Administered 2021-03-27 (×2): 5000 [IU] via INTRAVENOUS

## 2021-03-27 MED ORDER — LABETALOL HCL 5 MG/ML IV SOLN: 10.0000 mg | INTRAVENOUS | Status: DC | PRN

## 2021-03-27 MED ORDER — SODIUM CHLORIDE 0.9 % WEIGHT BASED INFUSION: 1.0000 mL/kg/h | INTRAVENOUS | Status: DC

## 2021-03-27 MED ORDER — SODIUM CHLORIDE 0.9% FLUSH: 3.0000 mL | Freq: Two times a day (BID) | INTRAVENOUS | Status: DC

## 2021-03-27 MED ORDER — LIDOCAINE HCL (PF) 1 % IJ SOLN
INTRAMUSCULAR | Status: AC
  Filled 2021-03-27: qty 30

## 2021-03-27 MED ORDER — NITROGLYCERIN 0.4 MG SL SUBL
0.4000 mg | SUBLINGUAL_TABLET | SUBLINGUAL | 3 refills | Status: AC | PRN
  Filled 2021-03-27: qty 25, 7d supply, fill #0

## 2021-03-27 MED ORDER — HYDRALAZINE HCL 20 MG/ML IJ SOLN: 10.0000 mg | INTRAMUSCULAR | Status: DC | PRN

## 2021-03-27 MED ORDER — MIDAZOLAM HCL 2 MG/2ML IJ SOLN
INTRAMUSCULAR | Status: AC
  Filled 2021-03-27: qty 2

## 2021-03-27 MED ORDER — SODIUM CHLORIDE 0.9 % WEIGHT BASED INFUSION
3.0000 mL/kg/h | INTRAVENOUS | Status: AC
  Administered 2021-03-27: 3 mL/kg/h via INTRAVENOUS

## 2021-03-27 MED ORDER — HEPARIN (PORCINE) IN NACL 1000-0.9 UT/500ML-% IV SOLN
INTRAVENOUS | Status: DC | PRN
  Administered 2021-03-27 (×2): 500 mL

## 2021-03-27 MED ORDER — ONDANSETRON HCL 4 MG/2ML IJ SOLN: 4.0000 mg | Freq: Four times a day (QID) | INTRAMUSCULAR | Status: DC | PRN

## 2021-03-27 MED ORDER — SODIUM CHLORIDE 0.9 % IV SOLN: 250.0000 mL | INTRAVENOUS | Status: DC | PRN

## 2021-03-27 MED ORDER — HEPARIN SODIUM (PORCINE) 1000 UNIT/ML IJ SOLN
INTRAMUSCULAR | Status: AC
  Filled 2021-03-27: qty 10

## 2021-03-27 MED ORDER — CLOPIDOGREL BISULFATE 300 MG PO TABS
ORAL_TABLET | ORAL | Status: AC
  Filled 2021-03-27: qty 2

## 2021-03-27 MED ORDER — SODIUM CHLORIDE 0.9% FLUSH: 3.0000 mL | INTRAVENOUS | Status: DC | PRN

## 2021-03-27 MED ORDER — ACETAMINOPHEN 325 MG PO TABS: 650.0000 mg | ORAL_TABLET | ORAL | Status: DC | PRN

## 2021-03-27 MED ORDER — NITROGLYCERIN 1 MG/10 ML FOR IR/CATH LAB
INTRA_ARTERIAL | Status: DC | PRN
  Administered 2021-03-27: 200 ug via INTRACORONARY

## 2021-03-27 MED ORDER — CLOPIDOGREL BISULFATE 300 MG PO TABS
ORAL_TABLET | ORAL | Status: DC | PRN
  Administered 2021-03-27: 300 mg via ORAL

## 2021-03-27 MED ORDER — FENTANYL CITRATE (PF) 100 MCG/2ML IJ SOLN
INTRAMUSCULAR | Status: DC | PRN
  Administered 2021-03-27: 25 ug via INTRAVENOUS

## 2021-03-27 MED ORDER — FAMOTIDINE IN NACL 20-0.9 MG/50ML-% IV SOLN
INTRAVENOUS | Status: DC | PRN
  Administered 2021-03-27: 20 mg via INTRAVENOUS

## 2021-03-27 MED ORDER — NITROGLYCERIN 1 MG/10 ML FOR IR/CATH LAB
INTRA_ARTERIAL | Status: AC
  Filled 2021-03-27: qty 10

## 2021-03-27 MED ORDER — FAMOTIDINE IN NACL 20-0.9 MG/50ML-% IV SOLN
INTRAVENOUS | Status: AC
  Filled 2021-03-27: qty 50

## 2021-03-27 MED ORDER — ROSUVASTATIN CALCIUM 20 MG PO TABS
20.0000 mg | ORAL_TABLET | Freq: Every day | ORAL | 6 refills | Status: DC
  Filled 2021-03-27: qty 30, 30d supply, fill #0

## 2021-03-27 MED ORDER — LIDOCAINE HCL (PF) 1 % IJ SOLN
INTRAMUSCULAR | Status: DC | PRN
  Administered 2021-03-27: 2 mL

## 2021-03-27 MED ORDER — IOHEXOL 350 MG/ML SOLN
INTRAVENOUS | Status: DC | PRN
  Administered 2021-03-27: 95 mL

## 2021-03-27 MED ORDER — ASPIRIN 81 MG PO CHEW: 81.0000 mg | CHEWABLE_TABLET | ORAL | Status: DC

## 2021-03-27 MED ORDER — HEPARIN (PORCINE) IN NACL 1000-0.9 UT/500ML-% IV SOLN
INTRAVENOUS | Status: AC
  Filled 2021-03-27: qty 1000

## 2021-03-27 MED ORDER — FENTANYL CITRATE (PF) 100 MCG/2ML IJ SOLN
INTRAMUSCULAR | Status: AC
  Filled 2021-03-27: qty 2

## 2021-03-27 MED ORDER — MIDAZOLAM HCL 2 MG/2ML IJ SOLN
INTRAMUSCULAR | Status: DC | PRN
  Administered 2021-03-27: 2 mg via INTRAVENOUS

## 2021-03-27 SURGICAL SUPPLY — 16 items
BALLN SAPPHIRE 2.5X15 (BALLOONS) ×3
BALLOON SAPPHIRE 2.5X15 (BALLOONS) ×1 IMPLANT
CATH 5FR JL3.5 JR4 ANG PIG MP (CATHETERS) ×3 IMPLANT
CATH VISTA GUIDE 6FR JR4 (CATHETERS) ×3 IMPLANT
DEVICE RAD COMP TR BAND LRG (VASCULAR PRODUCTS) ×3 IMPLANT
GLIDESHEATH SLEND SS 6F .021 (SHEATH) ×3 IMPLANT
GUIDEWIRE INQWIRE 1.5J.035X260 (WIRE) ×1 IMPLANT
INQWIRE 1.5J .035X260CM (WIRE) ×3
KIT ENCORE 26 ADVANTAGE (KITS) ×3 IMPLANT
KIT HEART LEFT (KITS) ×3 IMPLANT
KIT HEMO VALVE WATCHDOG (MISCELLANEOUS) ×3 IMPLANT
PACK CARDIAC CATHETERIZATION (CUSTOM PROCEDURE TRAY) ×3 IMPLANT
STENT ONYX FRONTIER 3.5X30 (Permanent Stent) ×3 IMPLANT
TRANSDUCER W/STOPCOCK (MISCELLANEOUS) ×3 IMPLANT
TUBING CIL FLEX 10 FLL-RA (TUBING) ×3 IMPLANT
WIRE RUNTHROUGH .014X180CM (WIRE) ×3 IMPLANT

## 2021-03-27 NOTE — Telephone Encounter (Signed)
    Attention TOC pool,  This patient will need a TOC phone call after discharge. They are being discharged likely today, "same day PCI." Follow-up appointment has already been arranged with: me on 04/06/21 (note this is the [facility] office that day). They are a patient of Minus Breeding, MD.  Thank you! Charlie Pitter, PA-C

## 2021-03-27 NOTE — H&P (Signed)
Cardiology Office Note     Date:  02/20/2021    ID:  Jesus Juarez, DOB 16-Apr-1951, MRN [id number]   PCP:  Kathyrn Lass, MD             Cardiologist:   Minus Breeding, MD        Chief Complaint  Patient presents with   Shortness of Breath            History of Present Illness: Jesus Juarez is a 70 y.o. male who presents for evaluation of hypertension.  He has a history of a normal cath 20 yrs ago. His last nuclear was in 2017 with low risk study. He had palpitations in 10/2017 and an event monitor with SR/ST. In ER in Feb 2020 he had hand numbness other symptoms suggestive of a possible TIA.  CT head was normal and MRI of brian without acute abnormality.     He has seen a neurologist.  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   He had an angiogram.    The L MCA had complete occlusion.   He had a 75% stenosis of the R MCA prox M1 segment as well.  He is being treated for sleep apnea.     Since I last saw him he has had increasing shortness of breath.  He is getting short of breath walking on level ground.  This is new onset.  There is some chest discomfort.  Some of it is right-sided.  Some of it is substernal.  It happens with exertion and does go away with rest.  There is no radiation to his jaw or to his arms.  He is not having associated nausea vomiting or diaphoresis.  He is not describing PND or orthopnea.  He has no palpitations.  He does have dizziness and cannot walk with his eyes closed.  He had an episode of vertigo on Saturday.         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   History of cardiac catheterization      Normal coronaries - approximately 20 years ago   HLD (hyperlipidemia)     Hypertension     Obstructive sleep apnea      CPAP   Vitamin D deficiency             Past Surgical History:  Procedure Laterality Date   IR ANGIO INTRA EXTRACRAN SEL COM CAROTID  INNOMINATE BILAT MOD SED   10/03/2018   IR ANGIO VERTEBRAL SEL SUBCLAVIAN INNOMINATE UNI L MOD SED   10/03/2018   IR ANGIO VERTEBRAL SEL VERTEBRAL UNI R MOD SED   10/03/2018   IR US GUIDE VASC ACCESS RIGHT   10/03/2018              Current Outpatient Medications  Medication Sig Dispense Refill   amLODipine (NORVASC) 5 MG tablet Take 1 tablet (5 mg total) by mouth daily. 100 tablet 0   B Complex Vitamins (VITAMIN-B COMPLEX PO) Take 1 tablet by mouth daily.        cholecalciferol (VITAMIN D) 1000 units tablet Take 1,000 Units by mouth 2 (two) times daily.       clopidogrel (PLAVIX) 75 MG tablet Take 1 tablet (75 mg total) by mouth daily. 100 tablet 0   metoprolol tartrate (LOPRESSOR) 100 MG tablet TAKE 1 TABLET 2 HOURS PRIOR TO  CT 1 tablet 0   Multiple Vitamin (MULTIVITAMIN) tablet Take 1 tablet by mouth daily.       pravastatin (PRAVACHOL) 40 MG tablet TAKE 1 TABLET BY MOUTH EVERY DAY 100 tablet 0   vitamin C (ASCORBIC ACID) 500 MG tablet Take 500 mg by mouth daily.       chlorthalidone (HYGROTON) 25 MG tablet Take 1 tablet (25 mg total) by mouth daily. 100 tablet 0    No current facility-administered medications for this visit.      Allergies:   Penicillins and Elemental sulfur      ROS:  Please see the history of present illness.   Otherwise, review of systems are positive for none.   All other systems are reviewed and negative.      PHYSICAL EXAM: VS:  BP 125/75   Pulse 70   Ht 5\' 10"  (1.778 m)   Wt 229 lb 6.4 oz (104.1 kg)   SpO2 95%   BMI 32.92 kg/m  , BMI Body mass index is 32.92 kg/m. GENERAL:  Well appearing NECK:  No jugular venous distention, waveform within normal limits, carotid upstroke brisk and symmetric, no bruits, no thyromegaly LUNGS:  Clear to auscultation bilaterally CHEST:  Unremarkable HEART:  PMI not displaced or sustained,S1 and S2 within normal limits, no S3, no S4, no clicks, no rubs, no murmurs ABD:  Flat, positive bowel sounds normal in frequency in  pitch, no bruits, no rebound, no guarding, no midline pulsatile mass, no hepatomegaly, no splenomegaly EXT:  2 plus pulses throughout, no edema, no cyanosis no clubbing       EKG:  EKG is  ordered today. Sinus rhythm, rate 70, axis within normal limits, RSR prime in V1, early transition lead V2, no acute ST-T wave changes.  06/20/2020   Recent Labs: 11/17/2020: Creatinine, Ser 1.10      Lipid Panel         Component Value Date/Time    CHOL 188 07/06/2018 0729    TRIG 216.0 (H) 07/06/2018 0729    HDL 47.00 07/06/2018 0729    CHOLHDL 4 07/06/2018 0729    VLDL 43.2 (H) 07/06/2018 0729    LDLDIRECT 123.0 07/06/2018 0729           Wt Readings from Last 3 Encounters:  02/20/21 229 lb 6.4 oz (104.1 kg)  02/16/21 228 lb (103.4 kg)  10/21/20 227 lb 6 oz (103.1 kg)        Other studies Reviewed: Additional studies/ records that were reviewed today include: None Review of the above records demonstrates: N/A   ASSESSMENT AND PLAN:   SOB: Patient's shortness of breath and chest discomfort constant.  Possibly new onset exertional or unstable angina coronary CTA is indicated.  He has a high pretest probability of obstructive coronary disease.  I do not know that he be able walk on a treadmill adequately.   DIZZINESS:   This is unchanged from previous.  No further cardiac work-up.   HTN: The blood pressure is well controlled.  No change in therapy.    NEUROVASCULAR DISEASE:   He has had previous work-up as above and remains on Plavix.     DYSLIPIDEMIA:    His goal LDL is mildly elevated and further goals of therapy will be pending the results of the CT.    SLEEP APNEA:   He is having this managed by Dr. Claiborne Billings.     THYROID NODULES:   He needs follow up ultrasound in June 2023  Current medicines are reviewed at length with the patient today.  The patient does not have concerns regarding medicines.   The following changes have been made:  None   Labs/ tests ordered today  include:        Orders Placed This Encounter  Procedures   CT CORONARY MORPH W/CTA COR W/SCORE W/CA W/CM &/OR WO/CM   Basic Metabolic Panel (BMET)   EKG 12-Lead            Disposition:   FU with APP in 6 months.        Signed, Minus Breeding, MD  02/20/2021 1:26 PM    [facility]  ADDENDUM: The patient is interviewed and examined in the short stay area.  Since this office visit, he has undergone a coronary CTA-FFR which demonstrates nonobstructive CAD in the left main, LAD, and left circumflex.  The RCA has a 25 to 50% stenosis, but the CT-FFR was markedly abnormal suggestive of high-grade stenosis in this vessel.  The patient is referred today for cardiac catheterization in the setting of exertional angina and an abnormal CT-FFR suggesting hemodynamically significant disease in the RCA.  He reports CCS functional class II symptoms, currently treated with 2 antianginal drugs, also on antiplatelet therapy with clopidogrel.  His exam is notable for an alert, oriented male in no distress.  JVP is normal, lungs are clear, heart is regular rate and rhythm with no murmur gallop, abdomen is soft and nontender with no masses, extremities have no edema.  Radial and femoral pulses are 2+ and equal bilaterally.  Skin is warm and dry with no rash.  Plan to proceed with cardiac catheterization and possible PCI today. I have reviewed the risks, indications, and alternatives to cardiac catheterization, possible angioplasty, and stenting with the patient. Risks include but are not limited to bleeding, infection, vascular injury, stroke, myocardial infection, arrhythmia, kidney injury, radiation-related injury in the case of prolonged fluoroscopy use, emergency cardiac surgery, and death. The patient understands the risks of serious complication is 1-2 in [id number] with diagnostic cardiac cath and 1-2% or less with angioplasty/stenting.    Sherren Mocha 03/27/2021 10:49 AM

## 2021-03-27 NOTE — Discharge Summary (Signed)
Discharge Summary for Same Day PCI   Patient ID: Jesus Juarez MRN: [id number]; DOB: March 17, 1951  Admit date: 03/27/2021 Discharge date: 03/27/2021  Primary Care Provider: Kathyrn Lass, MD  Primary Cardiologist: Minus Breeding, MD  Primary Electrophysiologist:  None   Discharge Diagnoses    Principal Problem:   Coronary artery disease with exertional angina [facility]) Active Problems:   OSA (obstructive sleep apnea)   Dyslipidemia   Essential hypertension   Abnormal cardiac CT angiography   Diagnostic Studies/Procedures    Cardiac Catheterization 03/27/2021:    Prox RCA lesion is 90% stenosed.   A drug-eluting stent was successfully placed using a STENT ONYX FRONTIER 3.5X30.   Post intervention, there is a 0% residual stenosis.   1.  Severe single-vessel coronary artery disease with 90% eccentric stenosis of the proximal RCA, treated with PCI using a 3.5 x 30 mm Onyx frontier drug-eluting stent 2.  Mild diffuse nonobstructive plaquing involving the left main, LAD, and left circumflex.  There are no high-grade stenoses in any of these vessels. 3.  Normal LVEDP  Recommendations: Ongoing aggressive medical therapy for secondary risk reduction, dual antiplatelet therapy with aspirin and clopidogrel for at least 6 months without interruption, same-day PCI protocol as long as no early complications arise. _____________   History of Present Illness     Jesus Juarez is a 70 y.o. male with HTN, TIA 2/202 with cerebrovascular disease, HLD, OSA on CPAP (followed by Dr. Claiborne Billings), vitamin D deficiency, thyroid nodules who presented to the hospital for planned cardiac catheterization. He was recently seen by Dr. Percival Spanish in the office with new increasing dyspnea on exertion as well as some chest pain with exertion. He also has a history of dizziness and vertigo. Coronary CTA was arranged demonstrating mild-moderate atherosclerosis. FFR was concerning for high likelihood of hemodynamically  significant RCA stenosis, prompting recommendation for cardiac catheterization.  Hospital Course     The patient underwent cardiac cath as noted above with subsequent DES to 90% prox RCA stenosis. There was otherwise mild diffuse nonobstructive plaquing involving the left main, LAD, and left circumflex. There were no high-grade stenoses in any of these vessels. Dr. Dianne Dun recommended continued DAPT for at least 6 months without interruption - he was already on ASA and Plavix prior to admission due to history of TIA. He declined needing refills of these at this time. The patient was not seen by cardiac rehab while in short stay due to the holiday, but referral was placed to phase 2. There were no observed complications post cath. Radial cath site was re-evaluated prior to discharge and found to be stable without any complications. Instructions/precautions regarding cath site care were given prior to discharge.  Jesus Juarez was seen by Dr. Burt Knack prior to discharge from short stay and determined stable for discharge home. Follow up with our office has been arranged. Medications are listed below. Patient is retired so work note not needed. Pertinent changes include transition from pravastatin to rosuvastatin. If the patient is tolerating statin at time of follow-up appointment, would consider rechecking liver function/lipid panel in 6-8 weeks. _____________  Cath/PCI Registry Performance & Quality Measures: Aspirin prescribed? - Yes ADP Receptor Inhibitor (Plavix/Clopidogrel, Brilinta/Ticagrelor or Effient/Prasugrel) prescribed (includes medically managed patients)? - Yes High Intensity Statin (Lipitor 40-80mg  or Crestor 20-40mg ) prescribed? - Yes For EF <40%, was ACEI/ARB prescribed? - Not Applicable (EF >/= 95%) - prior echo 2020 wnl, will plan to consider echo as outpatient For EF <40%, Aldosterone Antagonist (Spironolactone or  Eplerenone) prescribed? - Not Applicable (EF >/= 67%) - prior echo  2020 wnl, will plan to consider echo as outpatient Cardiac Rehab Phase II ordered (Included Medically managed Patients)? - Yes  _____________   Discharge Vitals Blood pressure 133/65, pulse 69, temperature 97.7 F (36.5 C), temperature source Oral, resp. rate 18, height 5\' 11"  (1.803 m), weight 99.8 kg, SpO2 98 %.  Filed Weights   03/27/21 0714  Weight: 99.8 kg    Last Labs & Radiologic Studies    CBC Recent Labs    03/25/21 1126  WBC 8.7  HGB 15.9  HCT 46.9  MCV 87  PLT 269   _____________  CARDIAC CATHETERIZATION  Result Date: 03/27/2021   Prox RCA lesion is 90% stenosed.   A drug-eluting stent was successfully placed using a STENT ONYX FRONTIER 3.5X30.   Post intervention, there is a 0% residual stenosis. 1.  Severe single-vessel coronary artery disease with 90% eccentric stenosis of the proximal RCA, treated with PCI using a 3.5 x 30 mm Onyx frontier drug-eluting stent 2.  Mild diffuse nonobstructive plaquing involving the left main, LAD, and left circumflex.  There are no high-grade stenoses in any of these vessels. 3.  Normal LVEDP Recommendations: Ongoing aggressive medical therapy for secondary risk reduction, dual antiplatelet therapy with aspirin and clopidogrel for at least 6 months without interruption, same-day PCI protocol as long as no early complications arise.   CT CORONARY MORPH W/CTA COR W/SCORE W/CA W/CM &/OR WO/CM  Addendum Date: 03/23/2021   ADDENDUM REPORT: 03/23/2021 10:20 CLINICAL DATA:  Chest pain EXAM: Cardiac/Coronary CTA TECHNIQUE: A non-contrast, gated CT scan was obtained with axial slices of 3 mm through the heart for calcium scoring. Calcium scoring was performed using the Agatston method. A 120 kV prospective, gated, contrast cardiac scan was obtained. Gantry rotation speed was 250 msecs and collimation was 0.6 mm. Two sublingual nitroglycerin tablets (0.8 mg) were given. The 3D data set was reconstructed in 5% intervals of the 35-75% of the R-R  cycle. Diastolic phases were analyzed on a dedicated workstation using MPR, MIP, and VRT modes. The patient received 95 cc of contrast. FINDINGS: Image quality: Good. Noise artifact is: moderate. Coronary Arteries:  Normal coronary origin.  Right dominance. Left main: The left main is a large caliber vessel with a normal take off from the left coronary cusp that bifurcates to form a left anterior descending artery and a left circumflex artery. There is mild calcified plaque in the proximal and distal LM with associated stenosis of 25-49%. Left anterior descending artery: The LAD is patent without evidence of plaque or stenosis. The LAD gives off 3 patent diagonal branches with mild calcified plaque in the proximal D1 and D2 with associated stenosis of 25-49%. Left circumflex artery: The LCX is non-dominant and patent with no evidence of plaque or stenosis. The LCX gives off 2 patent obtuse marginal branches. There is minimal calcified plaque in the proximal OM1 with associated stenosis of < 25%. Right coronary artery: The RCA is dominant with normal take off from the right coronary cusp. The RCA terminates as a PDA and right posterolateral branch. There is mild calcified plaque in the proximal and mid RCA with associated stenosis of 25-49% but also could be as high as 50%. Right Atrium: Right atrial size is within normal limits. Right Ventricle: The right ventricular cavity is within normal limits. Left Atrium: Left atrial size is normal in size with no left atrial appendage filling defect. Left Ventricle: The ventricular  cavity size is within normal limits. There are no stigmata of prior infarction. There is no abnormal filling defect. Pulmonary arteries: Normal in size without proximal filling defect. Pulmonary veins: Normal pulmonary venous drainage. Pericardium: Normal thickness with no significant effusion or calcium present. Cardiac valves: The aortic valve is trileaflet without significant calcification. The  mitral valve is normal structure without significant calcification. Aorta: Normal caliber with no significant disease. Extra-cardiac findings: See attached radiology report for non-cardiac structures. IMPRESSION: 1. Coronary calcium score of 561. This was 77th percentile for age-, sex, and race-matched controls. 2.  Normal coronary origin with right dominance. 3.  Mild to moderate atherosclerosis.  CAD RADS 2. 4.  Recommend preventive therapy and risk factor modification. 5.  This study has been submitted for FFR analysis. RECOMMENDATIONS: 1. CAD-RADS 0: No evidence of CAD (0%). Consider non-atherosclerotic causes of chest pain. 2. CAD-RADS 1: Minimal non-obstructive CAD (0-24%). Consider non-atherosclerotic causes of chest pain. Consider preventive therapy and risk factor modification. 3. CAD-RADS 2: Mild non-obstructive CAD (25-49%). Consider non-atherosclerotic causes of chest pain. Consider preventive therapy and risk factor modification. 4. CAD-RADS 3: Moderate stenosis. Consider symptom-guided anti-ischemic pharmacotherapy as well as risk factor modification per guideline directed care. Additional analysis with CT FFR will be submitted. 5. CAD-RADS 4: Severe stenosis. (70-99% or > 50% left main). Cardiac catheterization or CT FFR is recommended. Consider symptom-guided anti-ischemic pharmacotherapy as well as risk factor modification per guideline directed care. Invasive coronary angiography recommended with revascularization per published guideline statements. 6. CAD-RADS 5: Total coronary occlusion (100%). Consider cardiac catheterization or viability assessment. Consider symptom-guided anti-ischemic pharmacotherapy as well as risk factor modification per guideline directed care. 7. CAD-RADS N: Non-diagnostic study. Obstructive CAD can't be excluded. Alternative evaluation is recommended. Fransico Him, MD Electronically Signed   By: Fransico Him M.D.   On: 03/23/2021 10:20   Result Date: 03/23/2021 EXAM:  OVER-READ INTERPRETATION  CT CHEST The following report is an over-read performed by radiologist Dr. Misty Stanley of [facility], [place] on 03/20/2021. This over-read does not include interpretation of cardiac or coronary anatomy or pathology. The coronary CTA interpretation by the cardiologist is attached. COMPARISON:  None. FINDINGS: Vascular: No significant vascular findings. Normal heart size. No pericardial effusion. Mediastinum/Nodes: No lymphadenopathy in the visualized mediastinum or hilar regions. Lungs/Pleura: No suspicious pulmonary nodule or mass within the visualized lung parenchyma. Upper Abdomen: Unremarkable. Musculoskeletal: No worrisome lytic or sclerotic osseous abnormality. IMPRESSION: No acute or clinically significant extracardiac findings. Electronically Signed: By: Misty Stanley M.D. On: 03/20/2021 12:59   CT CORONARY FRACTIONAL FLOW RESERVE DATA PREP  Result Date: 03/24/2021 EXAM: FFRCT ANALYSIS FINDINGS: FFRct analysis was performed on the original cardiac CT angiogram dataset. Diagrammatic representation of the FFRct analysis is provided in a separate PDF document in PACS. This dictation was created using the PDF document and an interactive 3D model of the results. 3D model is not available in the EMR/PACS. Normal FFR range is >0.80. EXAM: CT FFR analysis was performed on the original cardiac CTA dataset. Diagrammatic representation of the CT FFR analysis is provided in a separate PDF document in PACS. This dictation was created using the PDF document and an interactive 3D model of the results. The 3D model is not available in the EMR/PACS. INTERPRETATION: CT FFR provides simultaneous calculation of pressure and flow across the entire coronary tree. For clinical decision making, CT FFR values should be obtained 1-2 cm distal to the lower border of each stenosis measured. Coronary CTA-related artifacts may impair  the diagnostic accuracy of the original cardiac CTA and FFR CT  results. *Due to the fact that CT FFR represents a mathematically-derived analysis, it is recommended that the results be interpreted as follows: 1. CT FFR >0.80: Low likelihood of hemodynamic significance. 2. CT FFR 0.76-0.80: Borderline likelihood of hemodynamic significance. 3. CT FFR =< 0.75: High likelihood of hemodynamic significance. *Coronary CT Angiography-derived Fractional Flow Reserve Testing in Patients with Stable Coronary Artery Disease: Recommendations on Interpretation and Reporting. Radiology: Cardiothoracic Imaging. 2019;1(5):e190050 FINDINGS: 1. Left Main: Low likelihood of hemodynamic significance. (LM FFR = 0.99) 2. LAD: Low likelihood of hemodynamic significance. (Proximal FFR = 0.92, Mid FFR = 0.90, Distal FFR = 0.83, FFR Proximal FFR = 0.91, Distal FFR = 0.77) 3. LCX: Low likelihood of hemodynamic significance. (Proximal FFR = 0.99, mid and distal LCx not modeled). 4. RCA: High likelihood of hemodynamic significance. (Proximal FFR = 0.99, Mid FFR = 0.54, Distal FFR = 0.53) IMPRESSION: 1.  CT FFR analysis showed significant stenosis in the proximal RCA. 2.  Recommend cardic catheterization. Fransico Him, MD Electronically Signed   By: Fransico Him M.D.   On: 03/24/2021 19:15    Disposition   Pt is being discharged home today in good condition.  Follow-up Plans & Appointments     Follow-up Information     Charlie Pitter, PA-C Follow up.   Specialties: Cardiology, Radiology Why: [facility] location - a cardiology follow-up has been arranged for you on Monday Apr 06, 2021 at 2:20 PM (Arrive by 2:05 PM). Lisbeth Renshaw is one of our PAs with our cardiology team. Contact information: [address] [place] [address] [phone]                Discharge Instructions     AMB Referral to Cardiac Rehabilitation - Phase II   Complete by: As directed    Diagnosis: Coronary Stents   After initial evaluation and assessments completed: Virtual Based  Care may be provided alone or in conjunction with Phase 2 Cardiac Rehab based on patient barriers.: Yes   Amb Referral to Cardiac Rehabilitation   Complete by: As directed    Diagnosis: Coronary Stents   After initial evaluation and assessments completed: Virtual Based Care may be provided alone or in conjunction with Phase 2 Cardiac Rehab based on patient barriers.: Yes   Diet - low sodium heart healthy   Complete by: As directed    Increase activity slowly   Complete by: As directed    No driving for 3 days. No lifting over 5 lbs for 1 week. No sexual activity for 1 wee. Keep procedure site clean & dry. If you notice increased pain, swelling, bleeding or pus, call/return!  You may shower, but no soaking baths/hot tubs/pools for 1 week.        Discharge Medications   Allergies as of 03/27/2021       Reactions   Penicillins Hives   Did it involve swelling of the face/tongue/throat, SOB, or low BP? No Did it involve sudden or severe rash/hives, skin peeling, or any reaction on the inside of your mouth or nose? Yes Did you need to seek medical attention at a hospital or doctor's office? Unknown When did it last happen?       If all above answers are "NO", may proceed with cephalosporin use.   Elemental Sulfur Rash   Sulfa Antibiotics Rash        Medication List     STOP  taking these medications    metoprolol tartrate 100 MG tablet (pre-CT dose) Commonly known as: LOPRESSOR   pravastatin 40 MG tablet Commonly known as: PRAVACHOL       TAKE these medications    amLODipine 5 MG tablet Commonly known as: NORVASC Take 1 tablet (5 mg total) by mouth daily.   aspirin EC 81 MG tablet Take 81 mg by mouth daily. Swallow whole.   chlorthalidone 25 MG tablet Commonly known as: HYGROTON Take 1 tablet (25 mg total) by mouth daily.   cholecalciferol 1000 units tablet Commonly known as: VITAMIN D Take 1,000 Units by mouth 2 (two) times daily.   clopidogrel 75 MG  tablet Commonly known as: PLAVIX Take 1 tablet (75 mg total) by mouth daily.   multivitamin tablet Take 1 tablet by mouth daily.   nitroGLYCERIN 0.4 MG SL tablet Commonly known as: Nitrostat Place 1 tablet (0.4 mg total) under the tongue every 5 (five) minutes as needed for chest pain (up to 3 doses. If taking 3rd dose call 911).   rosuvastatin 20 MG tablet Commonly known as: Crestor Take 1 tablet (20 mg total) by mouth daily.   vitamin C 500 MG tablet Commonly known as: ASCORBIC ACID Take 500 mg by mouth daily.   VITAMIN-B COMPLEX PO Take 1 tablet by mouth daily.           Allergies Allergies  Allergen Reactions   Penicillins Hives    Did it involve swelling of the face/tongue/throat, SOB, or low BP? No Did it involve sudden or severe rash/hives, skin peeling, or any reaction on the inside of your mouth or nose? Yes Did you need to seek medical attention at a hospital or doctor's office? Unknown When did it last happen?       If all above answers are "NO", may proceed with cephalosporin use.    Elemental Sulfur Rash   Sulfa Antibiotics Rash    Outstanding Labs/Studies   If the patient is tolerating statin at time of follow-up appointment, would consider rechecking liver function/lipid panel in 6-8 weeks.  Duration of Discharge Encounter   Greater than 30 minutes including physician time.  Signed, Charlie Pitter, PA-C 03/27/2021, 4:24 PM

## 2021-03-30 ENCOUNTER — Encounter (HOSPITAL_COMMUNITY): Payer: Self-pay | Admitting: Cardiovascular Disease

## 2021-03-30 ENCOUNTER — Telehealth (HOSPITAL_COMMUNITY): Payer: Self-pay | Admitting: *Deleted

## 2021-03-30 NOTE — Telephone Encounter (Signed)
Called pt at home and discussed stent, Plavix, restrictions, diet, exercise, NTG, and CRPII. Pt and wife receptive, appropriate questions. Will refer to [facility] (order placed during admission). Will send ed materials in mail. [place], ACSM 1:36 PM 03/30/2021

## 2021-03-30 NOTE — Telephone Encounter (Signed)
Patient being discharged today-  Will TOC call for tomorrow. 11/29

## 2021-03-31 ENCOUNTER — Other Ambulatory Visit: Payer: Self-pay

## 2021-03-31 MED ORDER — CLOPIDOGREL BISULFATE 75 MG PO TABS: 75.0000 mg | ORAL_TABLET | Freq: Every day | ORAL | 0 refills | Status: DC

## 2021-03-31 NOTE — Telephone Encounter (Signed)
Patient contacted regarding discharge from [facility] on 03/30/21.  Patient understands to follow up with provider D. Dunn on 04/06/21 at 2:20  at [facility]. Patient understands discharge instructions? yes  Patient understands medications and regiment? yes  Patient understands to bring all medications to this visit? yes

## 2021-04-03 ENCOUNTER — Telehealth (HOSPITAL_COMMUNITY): Payer: Self-pay

## 2021-04-03 NOTE — Telephone Encounter (Signed)
Will check insurance benefits closer to scheduling and/or into the new year 2023. 

## 2021-04-03 NOTE — Telephone Encounter (Signed)
Attempted to call patient in regards to Cardiac Rehab - LM on VM 

## 2021-04-05 NOTE — Progress Notes (Signed)
Cardiology Office Note    Date:  04/06/2021   ID:  Jesus Juarez, DOB 08-18-1950, MRN [id number]  PCP:  Kathyrn Lass, MD  Cardiologist:  Minus Breeding, MD  Electrophysiologist:  None   Chief Complaint: f/u cath  History of Present Illness:   Jesus Juarez is a 70 y.o. male with history of HTN, TIA with cerebrovascular disease, HLD, OSA on CPAP (followed by Dr. Claiborne Billings), vitamin D deficiency, thyroid nodules, IRBBB and recently diagnosed CAD who presents for follow-up. He had a prior echo 07/2018 with EF 60-65%, impaired diastolic relaxation. He was recently seen by Dr. Percival Spanish in the office with new increasing dyspnea on exertion as well as some chest pain with exertion. He also has a history of dizziness and vertigo. Coronary CTA was arranged demonstrating mild-moderate atherosclerosis. FFR was concerning for high likelihood of hemodynamically significant RCA stenosis, prompting recommendation for cardiac catheterization. He underwent cath with findings below s/p DES to prox RCA with otherwise mild diffuse nonobstructive CAD and normal LVEDP. IT was recommended to continue dual antiplatelet therapy with aspirin and clopidogrel for at least 6 months without interruption although keeping in mind he was already on ASA + Plavix prior to cath due to hx of TIA. Pravastatin was changed to rosuvastatin.  He is seen back for follow-up feeling well without complaints. He has been gradually increasing his walking and has definitely noticed an improvement in his dyspnea on exertion. It is still there to a mild degree but nothing like it was before. No recurrent chest tightness. Tolerating all meds well without difficulty.  Labwork independently reviewed: 03/2021 CBC wnl, K 4.4, Cr 0.9, glu 106 KPN 10/2020 ALT wnl, LDL 85, trig 313 07/2018 LDL 123, A1C 5.6   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   CAD in native artery    a. s/p DES to RCA 03/2021, mild  residual disease for med rx.   Cerebrovascular disease    History of cardiac catheterization    Normal coronaries - approximately 20 years ago   HLD (hyperlipidemia)    Hypertension    Obstructive sleep apnea    CPAP   Vitamin D deficiency     Past Surgical History:  Procedure Laterality Date   CORONARY STENT INTERVENTION N/A 03/27/2021   Procedure: CORONARY STENT INTERVENTION;  Surgeon: Sherren Mocha, MD;  Location: [facility];  Service: Cardiovascular;  Laterality: N/A;   IR ANGIO INTRA EXTRACRAN SEL COM CAROTID INNOMINATE BILAT MOD SED  10/03/2018   IR ANGIO VERTEBRAL SEL SUBCLAVIAN INNOMINATE UNI L MOD SED  10/03/2018   IR ANGIO VERTEBRAL SEL VERTEBRAL UNI R MOD SED  10/03/2018   IR US GUIDE VASC ACCESS RIGHT  10/03/2018   LEFT HEART CATH AND CORONARY ANGIOGRAPHY N/A 03/27/2021   Procedure: LEFT HEART CATH AND CORONARY ANGIOGRAPHY;  Surgeon: Sherren Mocha, MD;  Location: [facility];  Service: Cardiovascular;  Laterality: N/A;    Current Medications: Current Meds  Medication Sig   amLODipine (NORVASC) 5 MG tablet Take 1 tablet (5 mg total) by mouth daily.   aspirin EC 81 MG tablet Take 81 mg by mouth daily. Swallow whole.   B Complex Vitamins (VITAMIN-B COMPLEX PO) Take 1 tablet by mouth daily.    chlorthalidone (HYGROTON) 25 MG tablet Take 1 tablet (25 mg total) by mouth daily.   cholecalciferol (VITAMIN D) 1000 units tablet Take 1,000 Units by mouth 2 (two) times  daily.   clopidogrel (PLAVIX) 75 MG tablet Take 1 tablet (75 mg total) by mouth daily.   Multiple Vitamin (MULTIVITAMIN) tablet Take 1 tablet by mouth daily.   nitroGLYCERIN (NITROSTAT) 0.4 MG SL tablet Place 1 tablet (0.4 mg total) under the tongue every 5 (five) minutes as needed for chest pain (up to 3 doses. If taking 3rd dose call 911).   rosuvastatin (CRESTOR) 20 MG tablet Take 1 tablet (20 mg total) by mouth daily.   vitamin C (ASCORBIC ACID) 500 MG tablet Take 500 mg by mouth daily.       Allergies:   Penicillins, Elemental sulfur, and Sulfa antibiotics   Social History   Socioeconomic History   Marital status: Married    Spouse name: Not on file   Number of children: 4   Years of education: 14   Highest education level: Associate degree: academic program  Occupational History   Occupation: retired  Tobacco Use   Smoking status: Former    Types: Cigarettes   Smokeless tobacco: Never   Tobacco comments:    tried as a teenager about a pack  Vaping Use   Vaping Use: Never used  Substance and Sexual Activity   Alcohol use: Yes    Alcohol/week: 0.0 standard drinks    Comment: occasional beer   Drug use: No   Sexual activity: Not on file  Other Topics Concern   Not on file  Social History Narrative   Lives with wife in a one story home.  Has 4 children.     Retired from Firefighter) in 2008.     Education: 2 year college degree.    Left Handed   Social Determinants of Health   Financial Resource Strain: Not on file  Food Insecurity: Not on file  Transportation Needs: Not on file  Physical Activity: Not on file  Stress: Not on file  Social Connections: Not on file     Family History:  The patient's family history includes Breast cancer in his mother; Dementia in his mother; Heart disease in his maternal grandmother; Hypertension in his mother; Kidney disease in his maternal grandmother; Supraventricular tachycardia in his mother.  ROS:   Please see the history of present illness.  All other systems are reviewed and otherwise negative.    EKGs/Labs/Other Studies Reviewed:    Studies reviewed are outlined and summarized above. Reports included below if pertinent.  Cath 03/2021     Prox RCA lesion is 90% stenosed.   A drug-eluting stent was successfully placed using a STENT ONYX FRONTIER 3.5X30.   Post intervention, there is a 0% residual stenosis.   1.  Severe single-vessel coronary artery disease with 90% eccentric stenosis of  the proximal RCA, treated with PCI using a 3.5 x 30 mm Onyx frontier drug-eluting stent 2.  Mild diffuse nonobstructive plaquing involving the left main, LAD, and left circumflex.  There are no high-grade stenoses in any of these vessels. 3.  Normal LVEDP  Recommendations: Ongoing aggressive medical therapy for secondary risk reduction, dual antiplatelet therapy with aspirin and clopidogrel for at least 6 months without interruption, same-day PCI protocol as long as no early complications arise.    2D echo 07/2018  1. The left ventricle has normal systolic function with an ejection  fraction of 60-65%. The cavity size was normal. There is mildly increased  left ventricular wall thickness. Left ventricular diastolic Doppler  parameters are consistent with impaired  relaxation.   2. The right ventricle has normal  systolic function. The cavity was  normal. There is no increase in right ventricular wall thickness.   3. The tricuspid valve is grossly normal.   4. The aortic valve is tricuspid Mild thickening of the aortic valve  Aortic valve regurgitation was not assessed by color flow Doppler.   5. Normal LV systolic function; mild diastolic dysfunction.     EKG:  EKG is ordered today, personally reviewed, demonstrating NSR 73bpm, IRBBB without acute changes  Recent Labs: 03/16/2021: BUN 14; Creatinine, Ser 0.93; Potassium 4.4; Sodium 138 03/25/2021: Hemoglobin 15.9; Platelets 269  Recent Lipid Panel    Component Value Date/Time   CHOL 188 07/06/2018 0729   TRIG 216.0 (H) 07/06/2018 0729   HDL 47.00 07/06/2018 0729   CHOLHDL 4 07/06/2018 0729   VLDL 43.2 (H) 07/06/2018 0729   LDLDIRECT 123.0 07/06/2018 0729    PHYSICAL EXAM:    VS:  BP 128/80   Pulse 73   Ht 5\' 11"  (1.803 m)   Wt 227 lb 12.8 oz (103.3 kg)   BMI 31.77 kg/m   BMI: Body mass index is 31.77 kg/m.  GEN: Well nourished, well developed male in no acute distress HEENT: normocephalic, atraumatic Neck: no JVD,  carotid bruits, or masses Cardiac: RRR; no murmurs, rubs, or gallops, no edema  Respiratory:  clear to auscultation bilaterally, normal work of breathing GI: soft, nontender, nondistended, + BS MS: no deformity or atrophy Skin: warm and dry, no rash, right radial cath site without hematoma, minimal resolving ecchymosis proximally, good pulse. Neuro:  Alert and Oriented x 3, Strength and sensation are intact, follows commands Psych: euthymic mood, full affect  Wt Readings from Last 3 Encounters:  04/06/21 227 lb 12.8 oz (103.3 kg)  03/27/21 220 lb (99.8 kg)  02/20/21 229 lb 6.4 oz (104.1 kg)     ASSESSMENT & PLAN:   1. CAD, baseline IRBBB on EKG - doing well post-PCI with improved dyspnea. Continue dual antiplatelet therapy - per cath note this should be continued for at least 6 months without interruption. Note he was on DAPT prior to cath for hx of TIA so may be candidate to continue long term. Will need to review with primary cardiologist at next [facility]. Continue amlodipine. He was not started on a BB in the hospital because he did not have an MI, but this can be considered in the future should he develop any worsening HTN. He did not have assessment of his LV function with recent studies so will arrange non-urgent baseline echocardiogram to assess. He is now ready to participate in cardiac rehab (phone notes indicate they're aware of referral).   2. Essential HTN - BP controlled today on present regimen, no changes made.   3. Hyperlipidemia with goal LDL <70 - tolerating switch from pravastatin to rosuvastatin. Recheck CMET, lipids in 8 weeks.  4. History of TIA - anticipation continuation of DAPT for this. Will also follow up cholesterol as above.    Cardiac Rehabilitation Eligibility Assessment  The patient is ready to start cardiac rehabilitation from a cardiac standpoint.    Disposition: F/u with Dr. Percival Spanish in 6 months.   Medication Adjustments/Labs and Tests Ordered: Current  medicines are reviewed at length with the patient today.  Concerns regarding medicines are outlined above. Medication changes, Labs and Tests ordered today are summarized above and listed in the Patient Instructions accessible in Encounters.   Signed, Charlie Pitter, PA-C  04/06/2021 2:20 PM    [facility] Phone: [phone]; Fax: [phone]

## 2021-04-06 ENCOUNTER — Ambulatory Visit: Payer: Medicare HMO | Admitting: Physician Assistant

## 2021-04-06 ENCOUNTER — Encounter: Payer: Self-pay | Admitting: Physician Assistant

## 2021-04-06 ENCOUNTER — Other Ambulatory Visit: Payer: Self-pay

## 2021-04-06 ENCOUNTER — Other Ambulatory Visit: Payer: Self-pay | Admitting: *Deleted

## 2021-04-06 VITALS — BP 128/80 | HR 73 | Ht 71.0 in | Wt 227.8 lb

## 2021-04-06 DIAGNOSIS — I2510 Atherosclerotic heart disease of native coronary artery without angina pectoris: Secondary | ICD-10-CM

## 2021-04-06 DIAGNOSIS — I10 Essential (primary) hypertension: Secondary | ICD-10-CM

## 2021-04-06 DIAGNOSIS — Z8673 Personal history of transient ischemic attack (TIA), and cerebral infarction without residual deficits: Secondary | ICD-10-CM

## 2021-04-06 DIAGNOSIS — E785 Hyperlipidemia, unspecified: Secondary | ICD-10-CM

## 2021-04-06 MED ORDER — ROSUVASTATIN CALCIUM 20 MG PO TABS
20.0000 mg | ORAL_TABLET | Freq: Every day | ORAL | 3 refills | Status: DC
Start: 2021-04-06 — End: 2021-11-19

## 2021-04-06 NOTE — Addendum Note (Signed)
Addended by: Charlie Pitter on: 04/06/2021 05:27 PM   Modules accepted: Level of Service

## 2021-04-06 NOTE — Patient Instructions (Signed)
Medication Instructions:  Your physician recommends that you continue on your current medications as directed. Please refer to the Current Medication list given to you today.  *If you need a refill on your cardiac medications before your next appointment, please call your pharmacy*   Lab Work: IN 2 MONTHS:  FASTING LIPID & LFT  If you have labs (blood work) drawn today and your tests are completely normal, you will receive your results only by: [facility] (if you have MyChart) OR A paper copy in the mail If you have any lab test that is abnormal or we need to change your treatment, we will call you to review the results.   Testing/Procedures: Your physician has requested that you have an echocardiogram. Echocardiography is a painless test that uses sound waves to create images of your heart. It provides your doctor with information about the size and shape of your heart and how well your heart's chambers and valves are working. This procedure takes approximately one hour. There are no restrictions for this procedure.    Follow-Up: At [facility], you and your health needs are our priority.  As part of our continuing mission to provide you with exceptional heart care, we have created designated Provider Care Teams.  These Care Teams include your primary Cardiologist (physician) and Advanced Practice Providers (APPs -  Physician Assistants and Nurse Practitioners) who all work together to provide you with the care you need, when you need it.  We recommend signing up for the patient portal called "MyChart".  Sign up information is provided on this After Visit Summary.  MyChart is used to connect with patients for Virtual Visits (Telemedicine).  Patients are able to view lab/test results, encounter notes, upcoming appointments, etc.  Non-urgent messages can be sent to your provider as well.   To learn more about what you can do with MyChart, go to [email or web address].    Your next  appointment:   6 month(s)  The format for your next appointment:   In Person  Provider:   Minus Breeding, MD     Other Instructions

## 2021-04-14 ENCOUNTER — Telehealth: Payer: Self-pay | Admitting: Cardiology

## 2021-04-14 NOTE — Telephone Encounter (Signed)
° °  Pre-operative Risk Assessment    Patient Name: Jesus Juarez  DOB: 14-Nov-1950 MRN: [id number]      Request for Surgical Clearance    Procedure:  Dental Cleaning  Date of Surgery:  Clearance 04/14/21                                 Surgeon:  Dr. Deatra Ina Surgeon's Group or Practice Name:  [facility] Phone number:  [phone] Fax number:  [phone]   Type of Clearance Requested:      Type of Anesthesia:  None    Additional requests/questions:  Patient had a stent put in 2 weeks ago and they need to make sure he does not need a premed. Patient is in the chair.  Signed, Selena Zobro   04/14/2021, 11:05 AM

## 2021-04-14 NOTE — Telephone Encounter (Signed)
° °  Patient Name: Jesus Juarez  DOB: February 16, 1951 MRN: [id number]  Primary Cardiologist: Minus Breeding, MD  Chart reviewed as part of pre-operative protocol coverage.   Simple dental extractions and cleaning are considered low risk procedures per guidelines and generally do not require any specific cardiac clearance. It is also generally accepted that for simple extractions and dental cleanings, there is no need to interrupt blood thinner therapy.  SBE prophylaxis is not required for the patient from a cardiac standpoint.  I will route this recommendation to the requesting party via Epic fax function and remove from pre-op pool.  Please call with questions.  [place], [place] 04/14/2021, 11:18 AM

## 2021-04-23 ENCOUNTER — Ambulatory Visit (HOSPITAL_COMMUNITY): Payer: Medicare HMO | Attending: Physician Assistant

## 2021-04-23 ENCOUNTER — Other Ambulatory Visit: Payer: Self-pay

## 2021-04-23 ENCOUNTER — Encounter: Payer: Self-pay | Admitting: Cardiology

## 2021-04-23 DIAGNOSIS — E785 Hyperlipidemia, unspecified: Secondary | ICD-10-CM | POA: Insufficient documentation

## 2021-04-23 DIAGNOSIS — I2510 Atherosclerotic heart disease of native coronary artery without angina pectoris: Secondary | ICD-10-CM | POA: Insufficient documentation

## 2021-04-23 DIAGNOSIS — I10 Essential (primary) hypertension: Secondary | ICD-10-CM | POA: Insufficient documentation

## 2021-04-23 DIAGNOSIS — Z8673 Personal history of transient ischemic attack (TIA), and cerebral infarction without residual deficits: Secondary | ICD-10-CM | POA: Insufficient documentation

## 2021-04-23 LAB — ECHOCARDIOGRAM COMPLETE
Area-P 1/2: 2.29 cm2
S' Lateral: 2.1 cm

## 2021-04-27 ENCOUNTER — Other Ambulatory Visit: Payer: Self-pay | Admitting: Cardiology

## 2021-04-29 ENCOUNTER — Other Ambulatory Visit: Payer: Self-pay

## 2021-04-29 MED ORDER — CHLORTHALIDONE 25 MG PO TABS: 25.0000 mg | ORAL_TABLET | Freq: Every day | ORAL | 1 refills | Status: DC

## 2021-05-11 ENCOUNTER — Encounter (HOSPITAL_COMMUNITY): Payer: Self-pay

## 2021-05-18 ENCOUNTER — Telehealth (HOSPITAL_COMMUNITY): Payer: Self-pay | Admitting: *Deleted

## 2021-05-18 NOTE — Telephone Encounter (Signed)
Pt received please contact letter for Cardiac rehab and called.  Pt is interested in participating in Cardiac rehab.  Support staff verified insurance benefits and eligibility.  Pt has 40.00 co pay per exercise session.  Pt would like to think about it as he is walking an hour a day for exercise. Cherre Huger, BSN Cardiac and Training and development officer

## 2021-05-27 DIAGNOSIS — H02831 Dermatochalasis of right upper eyelid: Secondary | ICD-10-CM | POA: Diagnosis not present

## 2021-05-27 DIAGNOSIS — H2513 Age-related nuclear cataract, bilateral: Secondary | ICD-10-CM | POA: Diagnosis not present

## 2021-05-27 DIAGNOSIS — H40033 Anatomical narrow angle, bilateral: Secondary | ICD-10-CM | POA: Diagnosis not present

## 2021-05-27 DIAGNOSIS — H02834 Dermatochalasis of left upper eyelid: Secondary | ICD-10-CM | POA: Diagnosis not present

## 2021-06-03 ENCOUNTER — Telehealth (HOSPITAL_COMMUNITY): Payer: Self-pay

## 2021-06-03 NOTE — Telephone Encounter (Signed)
Pt is not interested in scheduling for cardiac rehab at this time. Closed referral

## 2021-06-06 DIAGNOSIS — M79672 Pain in left foot: Secondary | ICD-10-CM | POA: Diagnosis not present

## 2021-06-07 ENCOUNTER — Encounter: Payer: Self-pay | Admitting: Cardiology

## 2021-06-08 ENCOUNTER — Other Ambulatory Visit: Payer: Self-pay

## 2021-06-08 DIAGNOSIS — E785 Hyperlipidemia, unspecified: Secondary | ICD-10-CM | POA: Diagnosis not present

## 2021-06-08 DIAGNOSIS — I679 Cerebrovascular disease, unspecified: Secondary | ICD-10-CM | POA: Diagnosis not present

## 2021-06-08 LAB — LIPID PANEL
Chol/HDL Ratio: 3.3 ratio (ref 0.0–5.0)
Cholesterol, Total: 131 mg/dL (ref 100–199)
HDL: 40 mg/dL (ref >39)
LDL Chol Calc (NIH): 56 mg/dL (ref 0–99)
Triglycerides: 219 mg/dL — ABNORMAL HIGH (ref 0–149)
VLDL Cholesterol Cal: 35 mg/dL (ref 5–40)

## 2021-06-08 LAB — COMPREHENSIVE METABOLIC PANEL
ALT: 22 IU/L (ref 0–44)
AST: 17 IU/L (ref 0–40)
Albumin/Globulin Ratio: 1.8 (ref 1.2–2.2)
Albumin: 4.5 g/dL (ref 3.8–4.8)
Alkaline Phosphatase: 83 IU/L (ref 44–121)
BUN/Creatinine Ratio: 19 (ref 10–24)
BUN: 20 mg/dL (ref 8–27)
Bilirubin Total: 0.6 mg/dL (ref 0.0–1.2)
CO2: 27 mmol/L (ref 20–29)
Calcium: 9.8 mg/dL (ref 8.6–10.2)
Chloride: 100 mmol/L (ref 96–106)
Creatinine, Ser: 1.04 mg/dL (ref 0.76–1.27)
Globulin, Total: 2.5 g/dL (ref 1.5–4.5)
Glucose: 107 mg/dL — ABNORMAL HIGH (ref 70–99)
Potassium: 4.2 mmol/L (ref 3.5–5.2)
Sodium: 142 mmol/L (ref 134–144)
Total Protein: 7 g/dL (ref 6.0–8.5)
eGFR: 77 mL/min/{1.73_m2} (ref >59)

## 2021-06-09 ENCOUNTER — Encounter: Payer: Self-pay | Admitting: Cardiology

## 2021-06-11 DIAGNOSIS — M79672 Pain in left foot: Secondary | ICD-10-CM | POA: Diagnosis not present

## 2021-07-07 ENCOUNTER — Encounter: Payer: Self-pay | Admitting: Cardiology

## 2021-07-13 ENCOUNTER — Ambulatory Visit: Payer: Medicare HMO | Admitting: Cardiovascular Disease

## 2021-07-13 ENCOUNTER — Other Ambulatory Visit: Payer: Self-pay

## 2021-07-13 ENCOUNTER — Encounter: Payer: Self-pay | Admitting: Cardiovascular Disease

## 2021-07-13 DIAGNOSIS — I2510 Atherosclerotic heart disease of native coronary artery without angina pectoris: Secondary | ICD-10-CM

## 2021-07-13 DIAGNOSIS — E785 Hyperlipidemia, unspecified: Secondary | ICD-10-CM | POA: Diagnosis not present

## 2021-07-13 DIAGNOSIS — G4733 Obstructive sleep apnea (adult) (pediatric): Secondary | ICD-10-CM | POA: Diagnosis not present

## 2021-07-13 DIAGNOSIS — I10 Essential (primary) hypertension: Secondary | ICD-10-CM | POA: Diagnosis not present

## 2021-07-13 NOTE — Patient Instructions (Signed)

## 2021-07-13 NOTE — Progress Notes (Incomplete)
Cardiology Office Note    Date:  07/13/2021   ID:  Jesus Juarez, DOB October 24, 1950, MRN [id number]  PCP:  Kathyrn Lass, MD  Cardiologist:  Shelva Majestic, MD (sleep); Dr. Percival Spanish  F/U sleep evaluation  History of Present Illness:  Jesus Juarez is a 71 y.o. male who is followed by Dr. Percival Spanish for his cardiology care.  He has a long history of sleep apnea for greater than 25 years.  After moving to [facility], he established care with me in follow-up of his sleep apnea in October 2020.  He presents for a 9-month follow-up evaluation.  Mr. 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  He has had issues with intermittent leg swelling.    Mr. Mahaffy was originally diagnosed with sleep apnea over 25 years ago in the early 1990s.   He has utilized several CPAP machines over the years.  He has not seen a sleep physician for a long time.  Recently had been [place] and his DME company was [employer].  It appears that he underwent a reevaluation with Novasom sleep study in August 2017 prior to receiving a new machine and his AHI was 51.9/h.  He had received a ResMed AirSense machine but over the past half a year he had reverted back to using his S9 Elite CPAP machine which is not wireless.  With the move he is packed up his new ResMed and apparently had not unpacked it yet to start using his new machine.  There is concern that since he had not been evaluated in several years that his settings may not be optimal and may need adjustment in verification.  For this reason he was scheduled for a new sleep evaluation with me.  Presently, he admits to 100% CPAP use.  We were able to obtain a download of his S9 Elite non-wireless CPAP machine from his card in the device.  Usage is 100% over 90 days and he is averaging 8 hours and 6 minutes of CPAP use.  At a 10 cm set pressure, AHI remains mildly increased at 6.3 with an  apnea index of 3.6 and hypopnea index of 2.7.  He is unaware of breakthrough snoring.  He denies bruxism, restless legs, hypnagogic hallucinations, or cataplectic events.  Prior initiating CPAP therapy he would have frequent awakenings, and awakening gasping for breath.  He also experienced significant daytime sleepiness.  At his initial evaluation I calculated an Epworth Sleepiness Scale score which endorsed at 10 and as shown below:  Epworth Sleepiness Scale: Situation   Chance of Dozing/Sleeping (0 = never , 1 = slight chance , 2 = moderate chance , 3 = high chance )   sitting and reading 1   watching TV 2   sitting inactive in a public place 1   being a passenger in a motor vehicle for an hour or more 2   lying down in the afternoon 2   sitting and talking to someone 0   sitting quietly after lunch (no alcohol) 2   while stopped for a few minutes in traffic as the driver 0   Total Score  10   During his initial evaluation with me he had brought his machine with him and a download from his card revealed 100% compliance with average use at 8 hours and 6 minutes.  He was set at a 10 cm pressure and AHI was  6.3.  At that time I recommended increasing CPAP to 12 cm water pressure.  Since that time we were able to arrange for his DME company at [facility] so that his machine could be linked to our office.  Since I last saw him, he has continued to use CPAP with excellent compliance.  He typically goes to bed at 11 PM and wakes up at 7 AM.  He has nocturia approximately 1 time per night.  A download was obtained from January 17 through June 17, 2020.  Usage is 100% with average use at 7 hours and 56 minutes.  His air sense 10 AutoSet unit is set at a minimum pressure of 12 with maximum up to 18.  There was some days of increased leak.  AHI was 4.0.  Upon further questioning, the patient leaves his machine on when he goes to the bathroom.  This may be creating a artery factual increase in his  95th percentile average pressure which is 15.8 with maximum average pressure at 17.1/h.  A new Epworth Sleepiness Scale score was calculated in the office today and this endorsed at 13 suggestive of residual daytime sleepiness.    He has continued to be on amlodipine 5 mg and chlorthalidone 25 mg for hypertension.  He is on pravastatin for hyperlipidemia.  He is on Plavix daily with remote symptoms of possible TIA and branch vessel occlusion off the left middle cerebral artery.  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 s/p DES to RCA 03/2021, mild residual disease for med rx.   Cerebrovascular disease    History of cardiac catheterization    Normal coronaries - approximately 20 years ago   HLD (hyperlipidemia)    Hypertension    Incomplete right bundle branch block (RBBB)    Obstructive sleep apnea    CPAP   Vitamin D deficiency     Past Surgical History:  Procedure Laterality Date   CORONARY STENT INTERVENTION N/A 03/27/2021   Procedure: CORONARY STENT INTERVENTION;  Surgeon: Sherren Mocha, MD;  Location: [facility];  Service: Cardiovascular;  Laterality: N/A;   IR ANGIO INTRA EXTRACRAN SEL COM CAROTID INNOMINATE BILAT MOD SED  10/03/2018   IR ANGIO VERTEBRAL SEL SUBCLAVIAN INNOMINATE UNI L MOD SED  10/03/2018   IR ANGIO VERTEBRAL SEL VERTEBRAL UNI R MOD SED  10/03/2018   IR US GUIDE VASC ACCESS RIGHT  10/03/2018   LEFT HEART CATH AND CORONARY ANGIOGRAPHY N/A 03/27/2021   Procedure: LEFT HEART CATH AND CORONARY ANGIOGRAPHY;  Surgeon: Sherren Mocha, MD;  Location: [facility];  Service: Cardiovascular;  Laterality: N/A;    Current Medications: Outpatient Medications Prior to Visit  Medication Sig Dispense Refill   amLODipine (NORVASC) 5 MG tablet TAKE 1 TABLET (5 MG TOTAL) BY MOUTH DAILY. 100 tablet 0   aspirin EC 81 MG tablet Take 81 mg by mouth daily. Swallow  whole.     B Complex Vitamins (VITAMIN-B COMPLEX PO) Take 1 tablet by mouth daily.      chlorthalidone (HYGROTON) 25 MG tablet Take 1 tablet (25 mg total) by mouth daily. 100 tablet 1   cholecalciferol (VITAMIN D) 1000 units tablet Take 1,000 Units by mouth 2 (two) times daily.     clopidogrel (PLAVIX) 75 MG tablet Take 1 tablet (75 mg total) by mouth daily. 100  tablet 0   Multiple Vitamin (MULTIVITAMIN) tablet Take 1 tablet by mouth daily.     nitroGLYCERIN (NITROSTAT) 0.4 MG SL tablet Place 1 tablet (0.4 mg total) under the tongue every 5 (five) minutes as needed for chest pain (up to 3 doses. If taking 3rd dose call 911). 25 tablet 3   rosuvastatin (CRESTOR) 20 MG tablet Take 1 tablet (20 mg total) by mouth daily. 90 tablet 3   vitamin C (ASCORBIC ACID) 500 MG tablet Take 500 mg by mouth daily.     No facility-administered medications prior to visit.     Allergies:   Penicillins, Elemental sulfur, and Sulfa antibiotics   Social History   Socioeconomic History   Marital status: Married    Spouse name: Not on file   Number of children: 4   Years of education: 14   Highest education level: Associate degree: academic program  Occupational History   Occupation: retired  Tobacco Use   Smoking status: Former    Types: Cigarettes   Smokeless tobacco: Never   Tobacco comments:    tried as a teenager about a pack  Vaping Use   Vaping Use: Never used  Substance and Sexual Activity   Alcohol use: Yes    Alcohol/week: 0.0 standard drinks    Comment: occasional beer   Drug use: No   Sexual activity: Not on file  Other Topics Concern   Not on file  Social History Narrative   Lives with wife in a one story home.  Has 4 children.     Retired from [employer]) in 2008.     Education: 2 year college degree.    Left Handed   Social Determinants of Health   Financial Resource Strain: Not on file  Food Insecurity: Not on file  Transportation Needs: Not on file   Physical Activity: Not on file  Stress: Not on file  Social Connections: Not on file   He retired at age 70.  He has 4 children and 1 grandchild.  Family History:  The patient's family history includes Breast cancer in his mother; Dementia in his mother; Heart disease in his maternal grandmother; Hypertension in his mother; Kidney disease in his maternal grandmother; Supraventricular tachycardia in his mother.  His father died at age 13.  His mother is still living.  ROS General: Negative; No fevers, chills, or night sweats;  HEENT: Negative; No changes in vision or hearing, sinus congestion, difficulty swallowing Pulmonary: Negative; No cough, wheezing, shortness of breath, hemoptysis Cardiovascular: Negative; No chest pain, presyncope, syncope, palpitations GI: Negative; No nausea, vomiting, diarrhea, or abdominal pain GU: Negative; No dysuria, hematuria, or difficulty voiding Musculoskeletal: Negative; no myalgias, joint pain, or weakness Hematologic/Oncology: Negative; no easy bruising, bleeding Endocrine: Negative; no heat/cold intolerance; no diabetes Neuro: Negative; no changes in balance, headaches Skin: Negative; No rashes or skin lesions Psychiatric: Negative; No behavioral problems, depression Sleep: Positive for OSA on CPAP no awareness of breakthrough snoring.  Positive for residual daytime sleepiness with an Epworth Sleepiness Scale score endorsed today at 13;  no bruxism, restless legs, hypnogognic hallucinations, no cataplexy Other comprehensive 14 point system review is negative.   PHYSICAL EXAM:   VS:  BP (!) 144/82    Pulse 67    Ht $R'5\' 11"'Gl$  (1.803 m)    Wt 231 lb 12.8 oz (105.1 kg)    SpO2 97%    BMI 32.33 kg/m     Repeat blood pressure by me 136/78  Wt Readings from  Last 3 Encounters:  07/13/21 231 lb 12.8 oz (105.1 kg)  04/06/21 227 lb 12.8 oz (103.3 kg)  03/27/21 220 lb (99.8 kg)    General: Alert, oriented, no distress.  Skin: normal turgor, no rashes,  warm and dry HEENT: Normocephalic, atraumatic. Pupils equal round and reactive to light; sclera anicteric; extraocular muscles intact;  Nose without nasal septal hypertrophy Mouth/Parynx benign; Mallinpatti scale 3 Neck: No JVD, no carotid bruits; normal carotid upstroke Lungs: clear to ausculatation and percussion; no wheezing or rales Chest wall: without tenderness to palpitation Heart: PMI not displaced, RRR, s1 s2 normal, 1/6 systolic murmur, no diastolic murmur, no rubs, gallops, thrills, or heaves Abdomen: soft, nontender; no hepatosplenomehaly, BS+; abdominal aorta nontender and not dilated by palpation. Back: no CVA tenderness Pulses 2+ Musculoskeletal: full range of motion, normal strength, no joint deformities Extremities: no clubbing cyanosis or edema, Homan's sign negative  Neurologic: grossly nonfocal; Cranial nerves grossly wnl Psychologic: Normal mood and affect   Studies/Labs Reviewed:   July 13, 2021 ECG (independently read by me): NSR at 67; IRBBB; QTc 454 msec  June 19, 2020 ECG (independently read by me): :  NSR at 63, no ectopy; QTc 431  February 01, 2019 ECG (independently read by me):Normal sinus rhythm at 60 bpm.  RV conduction delay.  Nonspecific ST changes.  Recent Labs: BMP Latest Ref Rng & Units 06/08/2021 03/16/2021 11/17/2020  Glucose 70 - 99 mg/dL 582(N) 147(R) -  BUN 8 - 27 mg/dL 20 14 -  Creatinine 5.78 - 1.27 mg/dL 7.39 4.76 9.34  BUN/Creat Ratio 10 - 24 19 15  -  Sodium 134 - 144 mmol/L 142 138 -  Potassium 3.5 - 5.2 mmol/L 4.2 4.4 -  Chloride 96 - 106 mmol/L 100 98 -  CO2 20 - 29 mmol/L 27 22 -  Calcium 8.6 - 10.2 mg/dL 9.8 9.4 -     Hepatic Function Latest Ref Rng & Units 06/08/2021 06/12/2018  Total Protein 6.0 - 8.5 g/dL 7.0 7.2  Albumin 3.8 - 4.8 g/dL 4.5 4.0  AST 0 - 40 IU/L 17 26  ALT 0 - 44 IU/L 22 26  Alk Phosphatase 44 - 121 IU/L 83 66  Total Bilirubin 0.0 - 1.2 mg/dL 0.6 0.5    CBC Latest Ref Rng & Units 03/25/2021 10/03/2018  06/12/2018  WBC 3.4 - 10.8 x10E3/uL 8.7 6.3 8.6  Hemoglobin 13.0 - 17.7 g/dL 08/11/2018 62.5 31.0  Hematocrit 37.5 - 51.0 % 46.9 41.7 46.2  Platelets 150 - 450 x10E3/uL 269 220 237   Lab Results  Component Value Date   MCV 87 03/25/2021   MCV 88.0 10/03/2018   MCV 88.0 06/12/2018   Lab Results  Component Value Date   TSH 2.62 07/06/2018   Lab Results  Component Value Date   HGBA1C 5.6 07/06/2018     BNP No results found for: BNP  ProBNP No results found for: PROBNP   Lipid Panel     Component Value Date/Time   CHOL 131 06/08/2021 0952   TRIG 219 (H) 06/08/2021 0952   HDL 40 06/08/2021 0952   CHOLHDL 3.3 06/08/2021 0952   CHOLHDL 4 07/06/2018 0729   VLDL 43.2 (H) 07/06/2018 0729   LDLCALC 56 06/08/2021 0952   LDLDIRECT 123.0 07/06/2018 0729   LABVLDL 35 06/08/2021 0952     RADIOLOGY: No results found.   Additional studies/ records that were reviewed today include:  I reviewed the records of Dr. 08/06/2021, imaging studies,Novasom sleep study from 01/17/2016  and obtained a download from his card in his old S9 Elite CPAP unit  A new download was obtained from his ResMed air sense 10 CPAP auto unit  ASSESSMENT:    1. Dyslipidemia   2. OSA (obstructive sleep apnea)    PLAN:  Mr. Zigmund Linse is a 71 year old gentleman who has a history of hypertension, documented aortic atherosclerosis as well as a remote TIA.  He was originally diagnosed with obstructive sleep apnea in the early 1990s and has been on CPAP therapy for almost 30 years.  His recent sleep evaluation in August 2017 confirmed severe sleep apnea with an AHI of 51.9/h.  He received a new ResMed air sense 10 CPAP machine at that time, but when he packed up for his move, he reverted back to using his old S9 Elite CPAP unit.  When I saw him in 2020, I interrogated his old [employer].  At that time adjustments were made.  He is now using [facility] in [place].  We were able to contact them and his  machine is now linked to our office for my review.  Presently, he has been keeping his machine on when he takes his mask off to go to the bathroom which is seemingly creating the need for higher pressure.  He also has had some days of mask leak.  His AHI is 4.0 and if the leak is considered I suspect his overall more accurate AHI is less.  He he believes he is sleeping well and is unaware of any breakthrough snoring.  He typically goes to bed around 11 PM and wakes up at 7 AM and has been having nocturia 1 time per night compared to previous significant increase.  He is uncertain what his mass type is but it seems like he is using an AirFit F 20 fullface mask.  I did discuss with him possible additional options.  His blood pressure today is stable on current therapy.  He is unaware of nocturnal palpitations.  He continues to be on pravastatin with LDL goal less than 70.  Labs are followed by his primary physician.  As long as he is stable I will see him in 1 year for reevaluation or sooner as needed.   Medication Adjustments/Labs and Tests Ordered: Current medicines are reviewed at length with the patient today.  Concerns regarding medicines are outlined above.  Medication changes, Labs and Tests ordered today are listed in the Patient Instructions below. Patient Instructions  Medication Instructions:  Your physician recommends that you continue on your current medications as directed. Please refer to the Current Medication list given to you today.  *If you need a refill on your cardiac medications before your next appointment, please call your pharmacy*   Follow-Up: At [facility], you and your health needs are our priority.  As part of our continuing mission to provide you with exceptional heart care, we have created designated Provider Care Teams.  These Care Teams include your primary Cardiologist (physician) and Advanced Practice Providers (APPs -  Physician Assistants and Nurse Practitioners)  who all work together to provide you with the care you need, when you need it.  We recommend signing up for the patient portal called "MyChart".  Sign up information is provided on this After Visit Summary.  MyChart is used to connect with patients for Virtual Visits (Telemedicine).  Patients are able to view lab/test results, encounter notes, upcoming appointments, etc.  Non-urgent messages can be sent to your provider  as well.   To learn more about what you can do with MyChart, go to [email or web address].    Your next appointment:   12 month(s)  The format for your next appointment:   In Person  Provider:   Shelva Majestic, MD   Other Instructions Sleep Clinic    Signed, Shelva Majestic, MD  07/13/2021 10:03 AM    [facility] [address] [place], [place], [place]  [address] Phone: [phone]

## 2021-07-19 ENCOUNTER — Encounter: Payer: Self-pay | Admitting: Cardiovascular Disease

## 2021-07-26 ENCOUNTER — Other Ambulatory Visit: Payer: Self-pay | Admitting: Cardiology

## 2021-07-28 MED ORDER — CLOPIDOGREL BISULFATE 75 MG PO TABS: 75.0000 mg | ORAL_TABLET | Freq: Every day | ORAL | 2 refills | Status: DC

## 2021-07-31 DIAGNOSIS — R42 Dizziness and giddiness: Secondary | ICD-10-CM | POA: Diagnosis not present

## 2021-07-31 DIAGNOSIS — H8102 Meniere's disease, left ear: Secondary | ICD-10-CM | POA: Diagnosis not present

## 2021-07-31 DIAGNOSIS — H6123 Impacted cerumen, bilateral: Secondary | ICD-10-CM | POA: Diagnosis not present

## 2021-07-31 DIAGNOSIS — H903 Sensorineural hearing loss, bilateral: Secondary | ICD-10-CM | POA: Diagnosis not present

## 2021-08-06 DIAGNOSIS — D225 Melanocytic nevi of trunk: Secondary | ICD-10-CM | POA: Diagnosis not present

## 2021-08-06 DIAGNOSIS — L821 Other seborrheic keratosis: Secondary | ICD-10-CM | POA: Diagnosis not present

## 2021-08-06 DIAGNOSIS — D485 Neoplasm of uncertain behavior of skin: Secondary | ICD-10-CM | POA: Diagnosis not present

## 2021-08-06 DIAGNOSIS — I8392 Asymptomatic varicose veins of left lower extremity: Secondary | ICD-10-CM | POA: Diagnosis not present

## 2021-08-06 DIAGNOSIS — L989 Disorder of the skin and subcutaneous tissue, unspecified: Secondary | ICD-10-CM | POA: Diagnosis not present

## 2021-08-06 DIAGNOSIS — L814 Other melanin hyperpigmentation: Secondary | ICD-10-CM | POA: Diagnosis not present

## 2021-08-27 DIAGNOSIS — H2513 Age-related nuclear cataract, bilateral: Secondary | ICD-10-CM | POA: Diagnosis not present

## 2021-08-27 DIAGNOSIS — H02834 Dermatochalasis of left upper eyelid: Secondary | ICD-10-CM | POA: Diagnosis not present

## 2021-08-27 DIAGNOSIS — H02831 Dermatochalasis of right upper eyelid: Secondary | ICD-10-CM | POA: Diagnosis not present

## 2021-08-27 DIAGNOSIS — H40033 Anatomical narrow angle, bilateral: Secondary | ICD-10-CM | POA: Diagnosis not present

## 2021-08-31 ENCOUNTER — Other Ambulatory Visit: Payer: Self-pay | Admitting: Cardiovascular Disease

## 2021-09-02 DIAGNOSIS — R109 Unspecified abdominal pain: Secondary | ICD-10-CM | POA: Diagnosis not present

## 2021-09-02 DIAGNOSIS — R053 Chronic cough: Secondary | ICD-10-CM | POA: Diagnosis not present

## 2021-09-02 DIAGNOSIS — M546 Pain in thoracic spine: Secondary | ICD-10-CM | POA: Diagnosis not present

## 2021-09-07 ENCOUNTER — Telehealth: Payer: Self-pay | Admitting: Cardiovascular Disease

## 2021-09-07 NOTE — Telephone Encounter (Signed)
A call was returned to the patient. He questioned if there 's a place locally he can get his CPAP supplies. The patient has now moved to [place] from [place]. He was informed that I can transfer his care over to someone here in town. Patient states that [facility] has done a good job and he does not want to change at this point. Patient was also informed that he can purchase supplies online as well. He will stay with [facility] for now. I will contact them to renew supplies. Patient will also come by to get a F30-i sample to try. ?

## 2021-09-07 NOTE — Telephone Encounter (Signed)
Patient would like to order a new mask and tubing for his cpap machine.  He wants to know if there is somewhere local in [place] he can get the equipment from.  ?

## 2021-09-09 ENCOUNTER — Telehealth: Payer: Self-pay | Admitting: Cardiovascular Disease

## 2021-09-09 NOTE — Telephone Encounter (Signed)
Patient called about the mask he was given the other day. He would like to speak to you about it.  ?

## 2021-09-09 NOTE — Telephone Encounter (Signed)
Returned a call to the patient. He wanted to know if I had received the CMN from [facility] in [place] for his supplies. I told him that I had not as of yet, however I will call them to find out if they have faxed it. ?

## 2021-09-11 DIAGNOSIS — H9190 Unspecified hearing loss, unspecified ear: Secondary | ICD-10-CM | POA: Diagnosis not present

## 2021-09-11 DIAGNOSIS — G4733 Obstructive sleep apnea (adult) (pediatric): Secondary | ICD-10-CM | POA: Diagnosis not present

## 2021-09-15 DIAGNOSIS — Z6832 Body mass index (BMI) 32.0-32.9, adult: Secondary | ICD-10-CM | POA: Diagnosis not present

## 2021-09-15 DIAGNOSIS — M546 Pain in thoracic spine: Secondary | ICD-10-CM | POA: Diagnosis not present

## 2021-09-15 DIAGNOSIS — R109 Unspecified abdominal pain: Secondary | ICD-10-CM | POA: Diagnosis not present

## 2021-09-17 ENCOUNTER — Other Ambulatory Visit: Payer: Self-pay | Admitting: Family Medicine

## 2021-09-17 DIAGNOSIS — M2569 Stiffness of other specified joint, not elsewhere classified: Secondary | ICD-10-CM | POA: Diagnosis not present

## 2021-09-17 DIAGNOSIS — R293 Abnormal posture: Secondary | ICD-10-CM | POA: Diagnosis not present

## 2021-09-17 DIAGNOSIS — M7918 Myalgia, other site: Secondary | ICD-10-CM | POA: Diagnosis not present

## 2021-09-17 DIAGNOSIS — R109 Unspecified abdominal pain: Secondary | ICD-10-CM

## 2021-09-17 DIAGNOSIS — M546 Pain in thoracic spine: Secondary | ICD-10-CM | POA: Diagnosis not present

## 2021-09-21 ENCOUNTER — Ambulatory Visit
Admission: RE | Admit: 2021-09-21 | Discharge: 2021-09-21 | Disposition: A | Discharge status: Home / Self Care | Payer: Medicare HMO | Source: Ambulatory Visit | Attending: Family Medicine | Admitting: Family Medicine

## 2021-09-21 DIAGNOSIS — N400 Enlarged prostate without lower urinary tract symptoms: Secondary | ICD-10-CM | POA: Diagnosis not present

## 2021-09-21 DIAGNOSIS — R109 Unspecified abdominal pain: Secondary | ICD-10-CM

## 2021-09-21 DIAGNOSIS — N281 Cyst of kidney, acquired: Secondary | ICD-10-CM | POA: Diagnosis not present

## 2021-09-24 DIAGNOSIS — M2569 Stiffness of other specified joint, not elsewhere classified: Secondary | ICD-10-CM | POA: Diagnosis not present

## 2021-09-24 DIAGNOSIS — M7918 Myalgia, other site: Secondary | ICD-10-CM | POA: Diagnosis not present

## 2021-09-24 DIAGNOSIS — M546 Pain in thoracic spine: Secondary | ICD-10-CM | POA: Diagnosis not present

## 2021-09-24 DIAGNOSIS — R293 Abnormal posture: Secondary | ICD-10-CM | POA: Diagnosis not present

## 2021-09-29 DIAGNOSIS — R293 Abnormal posture: Secondary | ICD-10-CM | POA: Diagnosis not present

## 2021-09-29 DIAGNOSIS — M546 Pain in thoracic spine: Secondary | ICD-10-CM | POA: Diagnosis not present

## 2021-09-29 DIAGNOSIS — M7918 Myalgia, other site: Secondary | ICD-10-CM | POA: Diagnosis not present

## 2021-09-29 DIAGNOSIS — M2569 Stiffness of other specified joint, not elsewhere classified: Secondary | ICD-10-CM | POA: Diagnosis not present

## 2021-10-01 ENCOUNTER — Ambulatory Visit (HOSPITAL_COMMUNITY)
Admission: RE | Admit: 2021-10-01 | Discharge: 2021-10-01 | Disposition: A | Discharge status: Home / Self Care | Payer: Medicare HMO | Source: Ambulatory Visit | Attending: Cardiology | Admitting: Cardiology

## 2021-10-01 DIAGNOSIS — R293 Abnormal posture: Secondary | ICD-10-CM | POA: Diagnosis not present

## 2021-10-01 DIAGNOSIS — E042 Nontoxic multinodular goiter: Secondary | ICD-10-CM | POA: Diagnosis not present

## 2021-10-01 DIAGNOSIS — M7918 Myalgia, other site: Secondary | ICD-10-CM | POA: Diagnosis not present

## 2021-10-01 DIAGNOSIS — M2569 Stiffness of other specified joint, not elsewhere classified: Secondary | ICD-10-CM | POA: Diagnosis not present

## 2021-10-01 DIAGNOSIS — E041 Nontoxic single thyroid nodule: Secondary | ICD-10-CM | POA: Insufficient documentation

## 2021-10-01 DIAGNOSIS — M546 Pain in thoracic spine: Secondary | ICD-10-CM | POA: Diagnosis not present

## 2021-10-04 DIAGNOSIS — R0602 Shortness of breath: Secondary | ICD-10-CM | POA: Insufficient documentation

## 2021-10-04 NOTE — Progress Notes (Unsigned)
Cardiology Office Note   Date:  10/05/2021   ID:  Jesus Juarez, DOB 1951-03-19, MRN [id number]  PCP:  Kathyrn Lass, MD  Cardiologist:   Minus Breeding, MD   Chief Complaint  Patient presents with   Muscle aches        History of Present Illness: Jesus Juarez is a 71 y.o. male who presents for evaluation of hypertension.  He has a history of a normal cath 20 yrs ago. His last nuclear was in 2017 with low risk study. He had palpitations in 10/2017 and an event monitor with SR/ST. In ER in Feb 2020 he had hand numbness other symptoms suggestive of a possible TIA.  CT head was normal and MRI of brian without acute abnormality.     He has seen a neurologist.  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   He had an angiogram.    The L MCA had complete occlusion.   He had a 75% stenosis of the R MCA prox M1 segment as well.   He had symptoms of unstable angina and PCI of an RCA in Nov 2022.   He is being treated for sleep apnea.    Since I last saw him he has had lots of muscle aches and some joint pains.  He seen physical therapy.  The patient denies any new symptoms such as chest discomfort, neck or arm discomfort. There has been no new shortness of breath, PND or orthopnea. There have been no reported palpitations, presyncope or syncope.  He still is going to [employer] and walking.  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 s/p DES to RCA 03/2021, mild residual disease for med rx.   Cerebrovascular disease    History of cardiac catheterization    Normal coronaries - approximately 20 years ago   HLD (hyperlipidemia)    Hypertension    Incomplete right bundle branch block (RBBB)    Obstructive sleep apnea    CPAP   Vitamin D deficiency     Past Surgical History:  Procedure Laterality Date   CORONARY STENT INTERVENTION N/A 03/27/2021   Procedure:  CORONARY STENT INTERVENTION;  Surgeon: Sherren Mocha, MD;  Location: [facility];  Service: Cardiovascular;  Laterality: N/A;   IR ANGIO INTRA EXTRACRAN SEL COM CAROTID INNOMINATE BILAT MOD SED  10/03/2018   IR ANGIO VERTEBRAL SEL SUBCLAVIAN INNOMINATE UNI L MOD SED  10/03/2018   IR ANGIO VERTEBRAL SEL VERTEBRAL UNI R MOD SED  10/03/2018   IR US GUIDE VASC ACCESS RIGHT  10/03/2018   LEFT HEART CATH AND CORONARY ANGIOGRAPHY N/A 03/27/2021   Procedure: LEFT HEART CATH AND CORONARY ANGIOGRAPHY;  Surgeon: Sherren Mocha, MD;  Location: [facility];  Service: Cardiovascular;  Laterality: N/A;     Current Outpatient Medications  Medication Sig Dispense Refill   amLODipine (NORVASC) 5 MG tablet TAKE 1 TABLET (5 MG TOTAL) BY MOUTH DAILY. 100 tablet 2   aspirin EC 81 MG tablet Take 81 mg by mouth daily. Swallow whole.     B Complex Vitamins (VITAMIN-B COMPLEX PO) Take 1 tablet by mouth daily.      cholecalciferol (VITAMIN D) 1000 units tablet Take 1,000 Units by mouth 2 (two) times daily.     clopidogrel (PLAVIX) 75 MG tablet Take 1 tablet (  75 mg total) by mouth daily. 100 tablet 2   Multiple Vitamin (MULTIVITAMIN) tablet Take 1 tablet by mouth daily.     nitroGLYCERIN (NITROSTAT) 0.4 MG SL tablet Place 1 tablet (0.4 mg total) under the tongue every 5 (five) minutes as needed for chest pain (up to 3 doses. If taking 3rd dose call 911). 25 tablet 3   rosuvastatin (CRESTOR) 20 MG tablet Take 1 tablet (20 mg total) by mouth daily. 90 tablet 3   vitamin C (ASCORBIC ACID) 500 MG tablet Take 500 mg by mouth daily.     chlorthalidone (HYGROTON) 25 MG tablet Take 1 tablet (25 mg total) by mouth daily. 100 tablet 1   No current facility-administered medications for this visit.    Allergies:   Penicillins, Elemental sulfur, and Sulfa antibiotics    ROS:  Please see the history of present illness.   Otherwise, review of systems are positive for joint pains, muscle aches, vertigo.   All other  systems are reviewed and negative.    PHYSICAL EXAM: VS:  BP 130/72   Pulse 74   Ht '5\' 11"'$  (1.803 m)   Wt 232 lb 9.6 oz (105.5 kg)   SpO2 99%   BMI 32.44 kg/m  , BMI Body mass index is 32.44 kg/m. GENERAL:  Well appearing NECK:  No jugular venous distention, waveform within normal limits, carotid upstroke brisk and symmetric, no bruits, no thyromegaly LUNGS:  Clear to auscultation bilaterally CHEST:  Unremarkable HEART:  PMI not displaced or sustained,S1 and S2 within normal limits, no S3, no S4, no clicks, no rubs, no murmurs ABD:  Flat, positive bowel sounds normal in frequency in pitch, no bruits, no rebound, no guarding, no midline pulsatile mass, no hepatomegaly, no splenomegaly EXT:  2 plus pulses throughout, no edema, no cyanosis no clubbing   EKG:  EKG is not ordered today.   Recent Labs: 03/25/2021: Hemoglobin 15.9; Platelets 269 06/08/2021: ALT 22; BUN 20; Creatinine, Ser 1.04; Potassium 4.2; Sodium 142    Lipid Panel    Component Value Date/Time   CHOL 131 06/08/2021 0952   TRIG 219 (H) 06/08/2021 0952   HDL 40 06/08/2021 0952   CHOLHDL 3.3 06/08/2021 0952   CHOLHDL 4 07/06/2018 0729   VLDL 43.2 (H) 07/06/2018 0729   LDLCALC 56 06/08/2021 0952   LDLDIRECT 123.0 07/06/2018 0729      Wt Readings from Last 3 Encounters:  10/05/21 232 lb 9.6 oz (105.5 kg)  07/13/21 231 lb 12.8 oz (105.1 kg)  04/06/21 227 lb 12.8 oz (103.3 kg)      Other studies Reviewed: Additional studies/ records that were reviewed today include: Labs Review of the above records demonstrates: N/A  ASSESSMENT AND PLAN:  CAD:   The patient has no new sypmtoms.  No further cardiovascular testing is indicated.  We will continue with aggressive risk reduction and meds as listed.  DIZZINESS:    He still has some vertigo but this is manageable.  No change in therapy.  HTN: The blood pressure is at target.  No change in therapy.  NEUROVASCULAR DISEASE:   He is on DAPT because of his  neurologic disorders.  No change in therapy.  DYSLIPIDEMIA:    His goal LDL was 56.  However, he thinks his Crestor might be causing symptoms so he is going to come off of it for 1 month.  We will restart it if he does not get better.  He will restart if he does get better and  see if the symptoms return.   SLEEP APNEA: He is followed by Dr. Claiborne Billings.  THYROID NODULES:  He had involution of a thyroid nodule on follow up study last week.  No further testing is indicated.   Current medicines are reviewed at length with the patient today.  The patient does not have concerns regarding medicines.  The following changes have been made:  None  Labs/ tests ordered today include:    No orders of the defined types were placed in this encounter.    Disposition:   FU with me in 1 year   Signed, Minus Breeding, MD  10/05/2021 9:01 AM    [place]

## 2021-10-05 ENCOUNTER — Ambulatory Visit: Payer: Medicare HMO | Admitting: Cardiology

## 2021-10-05 ENCOUNTER — Encounter: Payer: Self-pay | Admitting: Cardiology

## 2021-10-05 VITALS — BP 130/72 | HR 74 | Ht 71.0 in | Wt 232.6 lb

## 2021-10-05 DIAGNOSIS — R0602 Shortness of breath: Secondary | ICD-10-CM

## 2021-10-05 DIAGNOSIS — I2510 Atherosclerotic heart disease of native coronary artery without angina pectoris: Secondary | ICD-10-CM | POA: Diagnosis not present

## 2021-10-05 DIAGNOSIS — E041 Nontoxic single thyroid nodule: Secondary | ICD-10-CM | POA: Diagnosis not present

## 2021-10-05 DIAGNOSIS — E785 Hyperlipidemia, unspecified: Secondary | ICD-10-CM

## 2021-10-05 DIAGNOSIS — I10 Essential (primary) hypertension: Secondary | ICD-10-CM | POA: Diagnosis not present

## 2021-10-05 NOTE — Patient Instructions (Signed)
Medication Instructions: Please hold Rosuvastatin for 1 month to see if symptoms improve.   *If you need a refill on your cardiac medications before your next appointment, please call your pharmacy*   Lab Work: None ordered today   Testing/Procedures: None ordered today   Follow-Up: At [facility], you and your health needs are our priority.  As part of our continuing mission to provide you with exceptional heart care, we have created designated Provider Care Teams.  These Care Teams include your primary Cardiologist (physician) and Advanced Practice Providers (APPs -  Physician Assistants and Nurse Practitioners) who all work together to provide you with the care you need, when you need it.  We recommend signing up for the patient portal called "MyChart".  Sign up information is provided on this After Visit Summary.  MyChart is used to connect with patients for Virtual Visits (Telemedicine).  Patients are able to view lab/test results, encounter notes, upcoming appointments, etc.  Non-urgent messages can be sent to your provider as well.   To learn more about what you can do with MyChart, go to [email or web address].    Your next appointment:   1 year(s)  The format for your next appointment:   In Person  Provider:   Minus Breeding, MD

## 2021-10-06 DIAGNOSIS — M7918 Myalgia, other site: Secondary | ICD-10-CM | POA: Diagnosis not present

## 2021-10-06 DIAGNOSIS — M2569 Stiffness of other specified joint, not elsewhere classified: Secondary | ICD-10-CM | POA: Diagnosis not present

## 2021-10-06 DIAGNOSIS — R293 Abnormal posture: Secondary | ICD-10-CM | POA: Diagnosis not present

## 2021-10-06 DIAGNOSIS — M546 Pain in thoracic spine: Secondary | ICD-10-CM | POA: Diagnosis not present

## 2021-10-08 DIAGNOSIS — M2569 Stiffness of other specified joint, not elsewhere classified: Secondary | ICD-10-CM | POA: Diagnosis not present

## 2021-10-08 DIAGNOSIS — M546 Pain in thoracic spine: Secondary | ICD-10-CM | POA: Diagnosis not present

## 2021-10-08 DIAGNOSIS — R293 Abnormal posture: Secondary | ICD-10-CM | POA: Diagnosis not present

## 2021-10-08 DIAGNOSIS — M7918 Myalgia, other site: Secondary | ICD-10-CM | POA: Diagnosis not present

## 2021-10-14 ENCOUNTER — Other Ambulatory Visit (HOSPITAL_COMMUNITY): Payer: Self-pay

## 2021-10-15 DIAGNOSIS — Z6832 Body mass index (BMI) 32.0-32.9, adult: Secondary | ICD-10-CM | POA: Diagnosis not present

## 2021-10-15 DIAGNOSIS — M546 Pain in thoracic spine: Secondary | ICD-10-CM | POA: Diagnosis not present

## 2021-10-15 DIAGNOSIS — M4810 Ankylosing hyperostosis [Forestier], site unspecified: Secondary | ICD-10-CM | POA: Diagnosis not present

## 2021-10-20 DIAGNOSIS — R293 Abnormal posture: Secondary | ICD-10-CM | POA: Diagnosis not present

## 2021-10-20 DIAGNOSIS — M2569 Stiffness of other specified joint, not elsewhere classified: Secondary | ICD-10-CM | POA: Diagnosis not present

## 2021-10-20 DIAGNOSIS — M546 Pain in thoracic spine: Secondary | ICD-10-CM | POA: Diagnosis not present

## 2021-10-20 DIAGNOSIS — M7918 Myalgia, other site: Secondary | ICD-10-CM | POA: Diagnosis not present

## 2021-10-22 DIAGNOSIS — M2569 Stiffness of other specified joint, not elsewhere classified: Secondary | ICD-10-CM | POA: Diagnosis not present

## 2021-10-22 DIAGNOSIS — M7918 Myalgia, other site: Secondary | ICD-10-CM | POA: Diagnosis not present

## 2021-10-22 DIAGNOSIS — R293 Abnormal posture: Secondary | ICD-10-CM | POA: Diagnosis not present

## 2021-10-22 DIAGNOSIS — M546 Pain in thoracic spine: Secondary | ICD-10-CM | POA: Diagnosis not present

## 2021-10-27 DIAGNOSIS — M546 Pain in thoracic spine: Secondary | ICD-10-CM | POA: Diagnosis not present

## 2021-10-27 DIAGNOSIS — R293 Abnormal posture: Secondary | ICD-10-CM | POA: Diagnosis not present

## 2021-10-27 DIAGNOSIS — M7918 Myalgia, other site: Secondary | ICD-10-CM | POA: Diagnosis not present

## 2021-10-27 DIAGNOSIS — M2569 Stiffness of other specified joint, not elsewhere classified: Secondary | ICD-10-CM | POA: Diagnosis not present

## 2021-11-02 ENCOUNTER — Encounter: Payer: Self-pay | Admitting: Cardiology

## 2021-11-02 DIAGNOSIS — E785 Hyperlipidemia, unspecified: Secondary | ICD-10-CM

## 2021-11-05 DIAGNOSIS — R293 Abnormal posture: Secondary | ICD-10-CM | POA: Diagnosis not present

## 2021-11-05 DIAGNOSIS — M546 Pain in thoracic spine: Secondary | ICD-10-CM | POA: Diagnosis not present

## 2021-11-05 DIAGNOSIS — M2569 Stiffness of other specified joint, not elsewhere classified: Secondary | ICD-10-CM | POA: Diagnosis not present

## 2021-11-05 DIAGNOSIS — M7918 Myalgia, other site: Secondary | ICD-10-CM | POA: Diagnosis not present

## 2021-11-16 ENCOUNTER — Other Ambulatory Visit: Payer: Self-pay | Admitting: Family Medicine

## 2021-11-16 DIAGNOSIS — M546 Pain in thoracic spine: Secondary | ICD-10-CM

## 2021-11-16 DIAGNOSIS — M4810 Ankylosing hyperostosis [Forestier], site unspecified: Secondary | ICD-10-CM | POA: Diagnosis not present

## 2021-11-18 ENCOUNTER — Ambulatory Visit
Admission: RE | Admit: 2021-11-18 | Discharge: 2021-11-18 | Disposition: A | Discharge status: Home / Self Care | Payer: Medicare HMO | Source: Ambulatory Visit | Attending: Family Medicine | Admitting: Family Medicine

## 2021-11-18 DIAGNOSIS — M546 Pain in thoracic spine: Secondary | ICD-10-CM

## 2021-11-18 DIAGNOSIS — M549 Dorsalgia, unspecified: Secondary | ICD-10-CM | POA: Diagnosis not present

## 2021-11-19 ENCOUNTER — Encounter: Payer: Self-pay | Admitting: Pharmacist

## 2021-11-19 ENCOUNTER — Ambulatory Visit: Payer: Medicare HMO | Admitting: Pharmacist

## 2021-11-19 ENCOUNTER — Encounter: Payer: Self-pay | Admitting: Cardiology

## 2021-11-19 DIAGNOSIS — E785 Hyperlipidemia, unspecified: Secondary | ICD-10-CM | POA: Diagnosis not present

## 2021-11-19 DIAGNOSIS — M1990 Unspecified osteoarthritis, unspecified site: Secondary | ICD-10-CM | POA: Insufficient documentation

## 2021-11-19 DIAGNOSIS — Z8673 Personal history of transient ischemic attack (TIA), and cerebral infarction without residual deficits: Secondary | ICD-10-CM | POA: Insufficient documentation

## 2021-11-19 DIAGNOSIS — H8102 Meniere's disease, left ear: Secondary | ICD-10-CM | POA: Insufficient documentation

## 2021-11-19 DIAGNOSIS — I25118 Atherosclerotic heart disease of native coronary artery with other forms of angina pectoris: Secondary | ICD-10-CM

## 2021-11-19 DIAGNOSIS — I700 Atherosclerosis of aorta: Secondary | ICD-10-CM | POA: Insufficient documentation

## 2021-11-19 DIAGNOSIS — E669 Obesity, unspecified: Secondary | ICD-10-CM | POA: Insufficient documentation

## 2021-11-19 NOTE — Patient Instructions (Addendum)
It was nice meeting you today  We would like your LDL (bad cholesterol) to be less than 55  We would like to start you on a new medication called Repatha or Praluent.  Both of which you will inject once every 2 weeks  Once you have your cholesterol panel drawn I will check your results and the complete the prior authorization if needed  Please call with any questions  Karren Cobble, PharmD, [place], [place], [place], [place], [place], [address] Phone: [phone], Fax: [phone]

## 2021-11-19 NOTE — Progress Notes (Signed)
Patient ID: Jesus Juarez                 DOB: [birth year]                    MRN: [id number]     HPI: Jesus Juarez is a 71 y.o. male patient referred to lipid clinic by Dr Percival Spanish. PMH is significant for CAD, HTN, HLD, and TIA.  Patient is statin intolerant.  Patient presents today in good spirits. Was previously on pravastatin which was switched to rosuvastatin.  Walks for about an hour a day and noticed pain in hips and ankles. 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  Had cath on 03/27/21.  Discontinued rosuvastatin in early June and pain went away.  LDL 56 while taking rosuvastatin.  Current Medications: N/A  Intolerances:  Rosuvastatin Pravastatin  Risk Factors:  CAD Possible TIAs  LDL goal: <55  Labs: TC 131, Trigs 219, HDL 40, LDL 56 ( 06/08/21 while on rosuvastatin 20)  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   CAD in native artery    a. s/p DES to RCA 03/2021, mild residual disease for med rx.   Cerebrovascular disease    History of cardiac catheterization    Normal coronaries - approximately 20 years ago   HLD (hyperlipidemia)    Hypertension    Incomplete right bundle branch block (RBBB)    Obstructive sleep apnea    CPAP   Vitamin D deficiency     Current Outpatient Medications on File Prior to Visit  Medication Sig Dispense Refill   amLODipine (NORVASC) 5 MG tablet TAKE 1 TABLET (5 MG TOTAL) BY MOUTH DAILY. 100 tablet 2   aspirin EC (ASPIRIN ADULT LOW DOSE) 81 MG tablet      B Complex Vitamins (VITAMIN-B COMPLEX PO) Take 1 tablet by mouth daily.      chlorthalidone (HYGROTON) 25 MG tablet Take 1 tablet (25 mg total) by mouth daily. 100 tablet 1   cholecalciferol (VITAMIN D) 1000 units tablet Take 1,000 Units by mouth 2 (two) times daily.     clopidogrel (PLAVIX) 75 MG tablet Take 1 tablet (75 mg total) by mouth daily. 100 tablet 2   Multiple Vitamin (MULTIVITAMIN) tablet Take 1 tablet  by mouth daily.     nitroGLYCERIN (NITROSTAT) 0.4 MG SL tablet Place 1 tablet (0.4 mg total) under the tongue every 5 (five) minutes as needed for chest pain (up to 3 doses. If taking 3rd dose call 911). 25 tablet 3   rosuvastatin (CRESTOR) 20 MG tablet Take 1 tablet (20 mg total) by mouth daily. 90 tablet 3   vitamin C (ASCORBIC ACID) 500 MG tablet Take 500 mg by mouth daily.     No current facility-administered medications on file prior to visit.    Allergies  Allergen Reactions   Penicillins Hives    Did it involve swelling of the face/tongue/throat, SOB, or low BP? No Did it involve sudden or severe rash/hives, skin peeling, or any reaction on the inside of your mouth or nose? Yes Did you need to seek medical attention at a hospital or doctor's office? Unknown When did it last happen?       If all above answers are "NO", may proceed with cephalosporin use.    Elemental Sulfur Rash   Sulfa Antibiotics Rash    Assessment/Plan:  1. Hyperlipidemia - Patient  last LDL 56 which is very close to goal of <55, however this was when taking rosuvastatin '20mg'$ .  Unknown what current levels are.  Patient has appointment with PCP next month and lipid panel will be drawn. Wants to see what current numbers are as a baseline.  Advised he would benefit from an aggressive LDL goal due to CAD and TIA.  Recommended starting PCSK9i.  Using demo pen, educated patient on mechanism of action, storage, site selection, administration, and possible adverse effects. Patient was able to demonstrate in room.  After lipid panel from PCP is received will complete PA if warranted. Patient voices understanding.  Karren Cobble, PharmD, BCACP, [place], [place], [place], [place], [address] Phone: [phone], Fax: [phone]

## 2021-11-19 NOTE — Telephone Encounter (Signed)
Patient stated he and PCP believe ASA-EC is causing gastric pain. Patient wants to know if he can stop taking the enteric coated aspirin. He denies bleeding or black tarry stools. Stated when he presses between his ribs, it causes pain. He had MRI of thoracic spine WO contrast yesterday.

## 2021-11-23 ENCOUNTER — Other Ambulatory Visit: Payer: Self-pay | Admitting: Cardiology

## 2021-11-23 NOTE — Telephone Encounter (Signed)
Patient informed to stop taking aspirin and continue with plavix '75mg'$  daily. He stated he stopped taking aspirin already still has some gastric pain and abdominal bloating. Recommended that he contact his PCP or gastroenterologist to address that.

## 2021-11-30 ENCOUNTER — Other Ambulatory Visit: Payer: Self-pay | Admitting: Family Medicine

## 2021-11-30 DIAGNOSIS — R1013 Epigastric pain: Secondary | ICD-10-CM | POA: Diagnosis not present

## 2021-11-30 DIAGNOSIS — Z6832 Body mass index (BMI) 32.0-32.9, adult: Secondary | ICD-10-CM | POA: Diagnosis not present

## 2021-11-30 DIAGNOSIS — R052 Subacute cough: Secondary | ICD-10-CM | POA: Diagnosis not present

## 2021-12-04 DIAGNOSIS — H6123 Impacted cerumen, bilateral: Secondary | ICD-10-CM | POA: Diagnosis not present

## 2021-12-04 DIAGNOSIS — H9042 Sensorineural hearing loss, unilateral, left ear, with unrestricted hearing on the contralateral side: Secondary | ICD-10-CM | POA: Diagnosis not present

## 2021-12-04 DIAGNOSIS — R42 Dizziness and giddiness: Secondary | ICD-10-CM | POA: Diagnosis not present

## 2021-12-12 ENCOUNTER — Telehealth: Payer: Self-pay | Admitting: Student in an Organized Health Care Education/Training Program

## 2021-12-12 ENCOUNTER — Encounter: Payer: Self-pay | Admitting: Cardiology

## 2021-12-12 DIAGNOSIS — R52 Pain, unspecified: Secondary | ICD-10-CM | POA: Diagnosis not present

## 2021-12-12 DIAGNOSIS — R059 Cough, unspecified: Secondary | ICD-10-CM | POA: Diagnosis not present

## 2021-12-12 DIAGNOSIS — U071 COVID-19: Secondary | ICD-10-CM | POA: Diagnosis not present

## 2021-12-12 DIAGNOSIS — J029 Acute pharyngitis, unspecified: Secondary | ICD-10-CM | POA: Diagnosis not present

## 2021-12-12 NOTE — Telephone Encounter (Signed)
Paged by operator that patient was prescribed paxlovid and had questions. Patient dx with covid today and fevering. O2 sats 92% and HR 80s. Otherwise feeling ok. Reviewed meds and on monotherapy plavix (issues tolerating ASA with GI side effects). Had RCA PCI 03/27/21 with 90% RCA stenosis and DES x1 following admission for unstable angina. He also has R MCA prox M1 segment 75% stenosis and L MCA with complete occlusion following eval TIA in 06/2018 which was initially normal (CT/MRI) but subsequent CTA with stenosis, had collateral flow through M1.   While he is > 6 months out from PCI I would favor not interrupting plavix given known cerebral disease for which he is also on plavix. I do not think th risk/benefit favors transitioning to prasugrel for using Paxlovid. He agrees that it is not worth the risk even if small.   He is stable currently from Covid standpoint with O2/HR. I did recommend he be evaluated if O2 sats dropped consistently below 90 given prior PNA and underlying respiratory disease.

## 2021-12-14 NOTE — Telephone Encounter (Signed)
Paxlovid decreased clopidogrel concentration by up to 70%.  He had stent placed 11/22.  Do you want to add ASA 81 mg while on Paxlovid or is he okay as is for the time on Paxlovid?

## 2021-12-18 DIAGNOSIS — Z125 Encounter for screening for malignant neoplasm of prostate: Secondary | ICD-10-CM | POA: Diagnosis not present

## 2021-12-18 DIAGNOSIS — R7303 Prediabetes: Secondary | ICD-10-CM | POA: Diagnosis not present

## 2021-12-18 DIAGNOSIS — Z1159 Encounter for screening for other viral diseases: Secondary | ICD-10-CM | POA: Diagnosis not present

## 2021-12-24 ENCOUNTER — Other Ambulatory Visit (HOSPITAL_COMMUNITY): Payer: Self-pay | Admitting: Interventional Radiology

## 2021-12-24 ENCOUNTER — Ambulatory Visit
Admission: RE | Admit: 2021-12-24 | Discharge: 2021-12-24 | Disposition: A | Discharge status: Home / Self Care | Payer: Medicare HMO | Source: Ambulatory Visit | Attending: Family Medicine | Admitting: Family Medicine

## 2021-12-24 ENCOUNTER — Telehealth (HOSPITAL_COMMUNITY): Payer: Self-pay

## 2021-12-24 DIAGNOSIS — K449 Diaphragmatic hernia without obstruction or gangrene: Secondary | ICD-10-CM | POA: Diagnosis not present

## 2021-12-24 DIAGNOSIS — R1013 Epigastric pain: Secondary | ICD-10-CM

## 2021-12-24 DIAGNOSIS — K6389 Other specified diseases of intestine: Secondary | ICD-10-CM | POA: Diagnosis not present

## 2021-12-24 DIAGNOSIS — N281 Cyst of kidney, acquired: Secondary | ICD-10-CM | POA: Diagnosis not present

## 2021-12-24 DIAGNOSIS — K5730 Diverticulosis of large intestine without perforation or abscess without bleeding: Secondary | ICD-10-CM | POA: Diagnosis not present

## 2021-12-24 DIAGNOSIS — I771 Stricture of artery: Secondary | ICD-10-CM

## 2021-12-24 MED ORDER — IOPAMIDOL (ISOVUE-300) INJECTION 61%
100.0000 mL | Freq: Once | INTRAVENOUS | Status: AC | PRN
  Administered 2021-12-24: 100 mL via INTRAVENOUS

## 2021-12-24 NOTE — Telephone Encounter (Signed)
Pt will check with his wife and call back to schedule cta. AW

## 2021-12-25 DIAGNOSIS — H9113 Presbycusis, bilateral: Secondary | ICD-10-CM | POA: Diagnosis not present

## 2021-12-25 DIAGNOSIS — G4733 Obstructive sleep apnea (adult) (pediatric): Secondary | ICD-10-CM | POA: Diagnosis not present

## 2021-12-25 DIAGNOSIS — R7303 Prediabetes: Secondary | ICD-10-CM | POA: Diagnosis not present

## 2021-12-25 DIAGNOSIS — E669 Obesity, unspecified: Secondary | ICD-10-CM | POA: Diagnosis not present

## 2021-12-25 DIAGNOSIS — Z6832 Body mass index (BMI) 32.0-32.9, adult: Secondary | ICD-10-CM | POA: Diagnosis not present

## 2021-12-25 DIAGNOSIS — I10 Essential (primary) hypertension: Secondary | ICD-10-CM | POA: Diagnosis not present

## 2021-12-25 DIAGNOSIS — Z0000 Encounter for general adult medical examination without abnormal findings: Secondary | ICD-10-CM | POA: Diagnosis not present

## 2021-12-25 DIAGNOSIS — Z8673 Personal history of transient ischemic attack (TIA), and cerebral infarction without residual deficits: Secondary | ICD-10-CM | POA: Diagnosis not present

## 2021-12-28 ENCOUNTER — Telehealth: Payer: Self-pay | Admitting: Cardiology

## 2021-12-28 DIAGNOSIS — E785 Hyperlipidemia, unspecified: Secondary | ICD-10-CM

## 2021-12-28 NOTE — Telephone Encounter (Signed)
Returned call to patient, patient saw pharmD lipid clinic 7/20 and was suppose to have labs at PCP (including lipid) prior to submitting authorization for PCSK9 if needed.   Patient reports lipid was not completed.   Requesting this be ordered.     Lipid ordered. Patient aware. Will make pharmD aware.

## 2021-12-28 NOTE — Telephone Encounter (Signed)
Pt states at his last visit with pharm D, they talking about him getting his cholesterol checked by PCP so he can started a medication but they did not check his cholesterol and he would like to get it done through our office

## 2021-12-29 ENCOUNTER — Other Ambulatory Visit: Payer: Self-pay

## 2021-12-29 DIAGNOSIS — E785 Hyperlipidemia, unspecified: Secondary | ICD-10-CM

## 2021-12-29 LAB — LIPID PANEL
Chol/HDL Ratio: 5.4 ratio — ABNORMAL HIGH (ref 0.0–5.0)
Cholesterol, Total: 201 mg/dL — ABNORMAL HIGH (ref 100–199)
HDL: 37 mg/dL — ABNORMAL LOW (ref >39)
LDL Chol Calc (NIH): 101 mg/dL — ABNORMAL HIGH (ref 0–99)
Triglycerides: 371 mg/dL — ABNORMAL HIGH (ref 0–149)
VLDL Cholesterol Cal: 63 mg/dL — ABNORMAL HIGH (ref 5–40)

## 2021-12-30 ENCOUNTER — Telehealth: Payer: Self-pay | Admitting: Cardiology

## 2021-12-30 NOTE — Telephone Encounter (Signed)
Pt states his lab results for his cholesterol came back and they were high. He asked to speak to Karren Cobble, RPH-CPP about a shot that they talked about.

## 2022-01-05 ENCOUNTER — Encounter (HOSPITAL_COMMUNITY): Payer: Self-pay

## 2022-01-05 ENCOUNTER — Ambulatory Visit (HOSPITAL_COMMUNITY)
Admission: RE | Admit: 2022-01-05 | Discharge: 2022-01-05 | Disposition: A | Discharge status: Home / Self Care | Payer: Medicare HMO | Source: Ambulatory Visit | Attending: Interventional Radiology | Admitting: Interventional Radiology

## 2022-01-05 DIAGNOSIS — Z8679 Personal history of other diseases of the circulatory system: Secondary | ICD-10-CM | POA: Diagnosis not present

## 2022-01-05 DIAGNOSIS — M47812 Spondylosis without myelopathy or radiculopathy, cervical region: Secondary | ICD-10-CM | POA: Diagnosis not present

## 2022-01-05 DIAGNOSIS — I63512 Cerebral infarction due to unspecified occlusion or stenosis of left middle cerebral artery: Secondary | ICD-10-CM | POA: Diagnosis not present

## 2022-01-05 DIAGNOSIS — I771 Stricture of artery: Secondary | ICD-10-CM | POA: Insufficient documentation

## 2022-01-05 MED ORDER — SODIUM CHLORIDE (PF) 0.9 % IJ SOLN
INTRAMUSCULAR | Status: AC
  Filled 2022-01-05: qty 50

## 2022-01-05 MED ORDER — IOHEXOL 350 MG/ML SOLN
75.0000 mL | Freq: Once | INTRAVENOUS | Status: AC | PRN
  Administered 2022-01-05: 75 mL via INTRAVENOUS

## 2022-01-06 DIAGNOSIS — B079 Viral wart, unspecified: Secondary | ICD-10-CM | POA: Diagnosis not present

## 2022-01-06 DIAGNOSIS — B351 Tinea unguium: Secondary | ICD-10-CM | POA: Diagnosis not present

## 2022-01-06 DIAGNOSIS — D225 Melanocytic nevi of trunk: Secondary | ICD-10-CM | POA: Diagnosis not present

## 2022-01-06 DIAGNOSIS — L821 Other seborrheic keratosis: Secondary | ICD-10-CM | POA: Diagnosis not present

## 2022-01-06 DIAGNOSIS — L814 Other melanin hyperpigmentation: Secondary | ICD-10-CM | POA: Diagnosis not present

## 2022-01-06 DIAGNOSIS — L570 Actinic keratosis: Secondary | ICD-10-CM | POA: Diagnosis not present

## 2022-01-06 DIAGNOSIS — D492 Neoplasm of unspecified behavior of bone, soft tissue, and skin: Secondary | ICD-10-CM | POA: Diagnosis not present

## 2022-01-07 ENCOUNTER — Telehealth: Payer: Self-pay | Admitting: Cardiology

## 2022-01-07 DIAGNOSIS — Z8673 Personal history of transient ischemic attack (TIA), and cerebral infarction without residual deficits: Secondary | ICD-10-CM

## 2022-01-07 DIAGNOSIS — I679 Cerebrovascular disease, unspecified: Secondary | ICD-10-CM

## 2022-01-07 DIAGNOSIS — I25118 Atherosclerotic heart disease of native coronary artery with other forms of angina pectoris: Secondary | ICD-10-CM

## 2022-01-07 NOTE — Telephone Encounter (Signed)
Pt would like a callback from Pharmacist  Gerald Stabs) in regards to and injection that was discussed for Cholesterol. Please advise

## 2022-01-08 MED ORDER — PRALUENT 75 MG/ML ~~LOC~~ SOAJ: 1.0000 mL | SUBCUTANEOUS | 3 refills | Status: DC

## 2022-01-08 NOTE — Telephone Encounter (Signed)
PA approved for Praluent through 05/02/22

## 2022-01-08 NOTE — Telephone Encounter (Signed)
PA for Praluent submitted.  Key: [id number]

## 2022-01-08 NOTE — Telephone Encounter (Signed)
Pt is calling back requesting to speak to Rollen Sox, [facility] in regards to medication. Harrell Gave stated he would call patient back.

## 2022-01-08 NOTE — Addendum Note (Signed)
Addended by: Rollen Sox on: 01/08/2022 11:54 AM   Modules accepted: Orders

## 2022-01-08 NOTE — Telephone Encounter (Signed)
Called patient and lmom.  Will complete PA for PCSK9i and contact patient when approved.

## 2022-01-08 NOTE — Telephone Encounter (Signed)
Patient called back.  Answered questions regarding possible side effects. He is concerned regarding effects on his liver. Advised that adverse hepatic effects were uncommon but we would be happy to check LFTs when we repeat his lipid panel. Patient appreciative.

## 2022-01-21 DIAGNOSIS — R052 Subacute cough: Secondary | ICD-10-CM | POA: Diagnosis not present

## 2022-01-28 ENCOUNTER — Telehealth (HOSPITAL_COMMUNITY): Payer: Self-pay

## 2022-01-28 NOTE — Telephone Encounter (Signed)
Pt agreed to f/u in 1 year with a cta head and neck. AW

## 2022-02-19 DIAGNOSIS — G4733 Obstructive sleep apnea (adult) (pediatric): Secondary | ICD-10-CM | POA: Diagnosis not present

## 2022-02-22 ENCOUNTER — Encounter: Payer: Self-pay | Admitting: Neurology

## 2022-02-22 ENCOUNTER — Ambulatory Visit: Payer: Medicare HMO | Admitting: Neurology

## 2022-02-22 VITALS — BP 147/83 | HR 68 | Ht 71.0 in | Wt 228.0 lb

## 2022-02-22 DIAGNOSIS — I679 Cerebrovascular disease, unspecified: Secondary | ICD-10-CM | POA: Diagnosis not present

## 2022-02-22 NOTE — Patient Instructions (Signed)
Stroke is a medical emergency.  Know these warning signs of stroke. Every second counts:  Sudden numbness or weakness of the face, arm or leg, especially on one side of the body,  Sudden confusion, trouble speaking or understanding,  Sudden trouble seeing in one eye or both eyes,  Sudden trouble walking, dizziness, loss of balance or coordination,  Sudden severe headache with no known cause.  If you or someone with you has one or more of these signs, don't delay!  Immediately call 9-1-1, or the emergency medical services (EMS) number so an ambulance (ideally with advanced life support) can be sent for you.  Also, check the time so that you will know when the symptoms first appeared. It's very important to take immediate action. Medical treatment may be available if action is taken early enough.   General guidelines for stroke risk factor management, if present  Hypertension  Target blood pressure <140/90, <130/80 for high risk; normal is 120/80  Hyperlipidemia  Target total cholesterol < 200  Target LDL <100, < 70 for high risk  Target HDL >45 for men, >55 for women  Target triglycerides <150  Diabetes  Target HgbA1c <7%  Smoking  Target is smoking cessation  Physical inactivity  Target is exercise at least 3 times per week  Target waist circumference, in inches is <35 for women and <40 for men

## 2022-02-22 NOTE — Progress Notes (Signed)
Follow-up Visit   Date: 02/22/22   Jesus Juarez MRN: [id number] DOB: 01-Apr-1951   Interim History: Jesus Juarez is a 71 y.o. left-handed Caucasian male with hypertension, OSA, and TIA (2020) returning to the clinic for follow-up of intracranial stenosis.  The patient was accompanied to the clinic by self.  History of present illness: Starting on 06/11/2018, he began having spells of tingling involving the entire right arm and numbness over the face, lasting 30-seconds and then resolving.  This occurs 1-2 times per day. There is no speech changes, vision changes, or limb weakness.   He went to the ER on 2/10 where CT head and MRI head was normal. He was recommended to take aspirin 21m, but is not compliant with taking this due to reading side effects.  He does not have history of hypertension and blood pressure was elevated 175/88 and was started on amlodipine.  Over the next two weeks, he continued to have spells of right face, arm, and torso numbness/tingling, the longest which lasted 5 minute.  He noticed that it was always be associated with his blood pressure being elevated.     He did not tolerate statin therapy in the past and does not want to take this again.  UPDATE 02/15/2020:  He is here for follow-up visit.  He has been doing well and denies any vision changes, numbness/tingling, or weakness.  He has CTA head and neck from May 2021 showed left M1 occlusion with collateral flow and 50% right M1 stenosis, which is stable.  Bilateral ICA are patent. He has been compliant with taking his medication and is hoping to make lifestyle changes to reduce his blood pressure medications.  He is tolerating aspirin 820m plavix 7529mand pravastatin 55m42m UPDATE 02/16/2021:  He is here for 1-year follow-up.  He has been doing fairly well.  His aspirin was stopped over the past year due to blood in his stool.  He remains compliant with plavix 75mg52m pravastatin.  He had repeat CTA  head and neck by Dr. DevesEstanislado Pandyh shows stable left M1 occlusion and 50% narrowing of the right M1. About two days ago, he began having vertigo, which he has experienced multiple times in the past, because of this, his balance is off.  It typically improved within a few days. He also complains of shortness of breath with exertion, and needs to take breaks when walking.   UPDATE 02/22/2022:  He is here for follow-up visit.  He has been doing well.  He had COVID in August which was treated at home.  Repeat CTA head and neck from 01/2022 shows stable left M1 occlusion and 50% narrowing of the right M1.  No new neurological symptoms or TIA-like symptoms.   Medications:  Current Outpatient Medications on File Prior to Visit  Medication Sig Dispense Refill   Alirocumab (PRALUENT) 75 MG/ML SOAJ Inject 1 mL into the skin every 14 (fourteen) days. 6 mL 3   amLODipine (NORVASC) 5 MG tablet TAKE 1 TABLET (5 MG TOTAL) BY MOUTH DAILY. 100 tablet 2   B Complex Vitamins (VITAMIN-B COMPLEX PO) Take 1 tablet by mouth daily.      chlorthalidone (HYGROTON) 25 MG tablet TAKE 1 TABLET (25 MG TOTAL) BY MOUTH DAILY. 100 tablet 1   cholecalciferol (VITAMIN D) 1000 units tablet Take 1,000 Units by mouth 2 (two) times daily.     clopidogrel (PLAVIX) 75 MG tablet Take 1 tablet (75 mg total) by mouth  daily. 100 tablet 2   Multiple Vitamin (MULTIVITAMIN) tablet Take 1 tablet by mouth daily.     nitroGLYCERIN (NITROSTAT) 0.4 MG SL tablet Place 1 tablet (0.4 mg total) under the tongue every 5 (five) minutes as needed for chest pain (up to 3 doses. If taking 3rd dose call 911). 25 tablet 3   vitamin C (ASCORBIC ACID) 500 MG tablet Take 500 mg by mouth daily.     Zinc Sulfate (ZINC 15 PO) Take by mouth.     No current facility-administered medications on file prior to visit.    Allergies:  Allergies  Allergen Reactions   Crestor [Rosuvastatin Calcium] Other (See Comments)    myalgias   Penicillins Hives    Did it  involve swelling of the face/tongue/throat, SOB, or low BP? No Did it involve sudden or severe rash/hives, skin peeling, or any reaction on the inside of your mouth or nose? Yes Did you need to seek medical attention at a hospital or doctor's office? Unknown When did it last happen?       If all above answers are "NO", may proceed with cephalosporin use.    Elemental Sulfur Rash   Sulfa Antibiotics Rash    Vital Signs:  BP (!) 147/83   Pulse 68   Ht 5' 11"  (1.803 m)   Wt 228 lb (103.4 kg)   SpO2 99%   BMI 31.80 kg/m     Neurological Exam: MENTAL STATUS including orientation to time, place, person, recent and remote memory, attention span and concentration, language, and fund of knowledge is normal.  Speech is not dysarthric.  CRANIAL NERVES:    Pupils equal round and reactive to light.  Normal conjugate, extra-ocular eye movements in all directions of gaze. Mild end-point nystagmus to the right.  No ptosis.  Face is symmetric.   MOTOR:  Motor strength is 5/5 in all extremities.  No atrophy, fasciculations or abnormal movements.  No pronator drift.  Tone is normal.    MSRs:  Reflexes are 2+/4 throughout.  SENSORY:  Intact to vibration throughout.  Rhomberg testing positive.   COORDINATION/GAIT:  Normal finger-to- nose-finger.  Intact rapid alternating movements bilaterally.  Gait mildly wide-based and stable.  Data:  Lab Results  Component Value Date   CHOL 201 (H) 12/29/2021   HDL 37 (L) 12/29/2021   LDLCALC 101 (H) 12/29/2021   LDLDIRECT 123.0 07/06/2018   TRIG 371 (H) 12/29/2021   CHOLHDL 5.4 (H) 12/29/2021    CTA head and neck 09/20/2019: Aortic Atherosclerosis (ICD10-I70.0).   Both carotid bifurcations widely patent. No stenosis or atherosclerotic disease seen on the right. Minimal soft plaque at the bifurcation on the left with a small lateral ulceration but no stenosis.   Mild atherosclerotic irregularity in both carotid siphon regions.   Redemonstration of  occlusion of the left M1 segment with distal collateral vessels.   Redemonstration of 50% stenosis of the right M1 segment, not visibly progressive since the previous study.   Several thyroid nodules. The largest is 16 mm at the lower pole on the left. Recommend thyroid US (ref: J Am Coll Radiol. 2015 Feb;12(2): 143-50).  MRI brain wo contrast 06/12/2018:  IMPRESSION: 1. Negative brain MRI. No acute intracranial abnormality identified. 2. Left mastoid effusion.   CT head wo contrast 06/12/2018:  Normal  CTA head and neck 11/17/2020: 1. Stable appearance of proximal left M1 segment occlusion. 2. Stable 50% narrowing of the proximal right M1 occlusion. 3. Collateral left MCA vessels are stable. 4.  No other significant proximal stenosis, aneurysm, or branch vessel occlusion within the Circle of Willis. 5. Mild atherosclerotic changes in the neck are stable. 6. Stable heterogeneous nodule posterior left thyroid is stable and is being monitored by ultrasound. This has been evaluated on previous imaging. (ref: J Am Coll Radiol. 2015 Feb;12(2): 143-50). 7. Aortic Atherosclerosis (ICD10-I70.0).  CTA head and neck 01/05/2022: 1. Stable noncontrast head CT with no acute intracranial pathology. 2. Unchanged left M1 occlusion with diminutive distal collaterals. 3. Unchanged moderate stenosis of the proximal right M1 segment. 4. Otherwise, patent vasculature of the head and neck with no other hemodynamically significant stenosis or occlusion.      IMPRESSION/PLAN: Transient ischemic attack (2020) due to intracranial stenosis with left M1 occlusion and 50% right M1 stenosis.  He is followed by Dr. Estanislado Pandy. Stable on imaging, no recurrent symptoms for the past three years.  Continue optimal medical management.  - Continue plavix 77m daily.  Not on aspirin due to GI bleed - Recently switched to Praluent for hyperlipidemia, intolerant to statin therapy (LDL 101) - Counseling on healthy lifestyle  changes  - Stroke warning signs discussed  Return to clinic as needed  Thank you for allowing me to participate in patient's care.  If I can answer any additional questions, I would be pleased to do so.    Sincerely,    Tykisha Areola K. PPosey Pronto DO

## 2022-03-12 DIAGNOSIS — H6123 Impacted cerumen, bilateral: Secondary | ICD-10-CM | POA: Diagnosis not present

## 2022-03-16 ENCOUNTER — Encounter: Payer: Self-pay | Admitting: Cardiology

## 2022-03-16 DIAGNOSIS — I679 Cerebrovascular disease, unspecified: Secondary | ICD-10-CM

## 2022-03-16 DIAGNOSIS — Z8673 Personal history of transient ischemic attack (TIA), and cerebral infarction without residual deficits: Secondary | ICD-10-CM

## 2022-03-16 DIAGNOSIS — I25118 Atherosclerotic heart disease of native coronary artery with other forms of angina pectoris: Secondary | ICD-10-CM

## 2022-04-05 MED ORDER — PRALUENT 75 MG/ML ~~LOC~~ SOAJ: 1.0000 mL | SUBCUTANEOUS | 3 refills | Status: DC

## 2022-04-05 NOTE — Addendum Note (Signed)
Addended by: Marcelle Overlie D on: 04/05/2022 02:28 PM   Modules accepted: Orders

## 2022-04-07 IMAGING — CT CT ANGIO NECK
2 of 11 series · 7 of 33 positions shown · IV contrast (APPLIED)
Comparison: None.

CT a head and neck 04/30/2020 and 09/20/2019.

CLINICAL DATA: Left MCA occlusion.  Right MCA stenosis.

EXAM:
CT ANGIOGRAPHY HEAD AND NECK
TECHNIQUE: Multidetector CT imaging of the head and neck was performed using
the standard protocol during bolus administration of intravenous
contrast. Multiplanar CT image reconstructions and MIPs were
obtained to evaluate the vascular anatomy. Carotid stenosis
measurements (when applicable) are obtained utilizing NASCET
criteria, using the distal internal carotid diameter as the
denominator.
CONTRAST:  75mL OMNIPAQUE IOHEXOL 350 MG/ML SOLN

[Series 9: cta head neck · axial · 0.47mm/px · z∈[-272,-140]mm · 2 of 199 slices shown]
[im 67/199  soft-tissue]
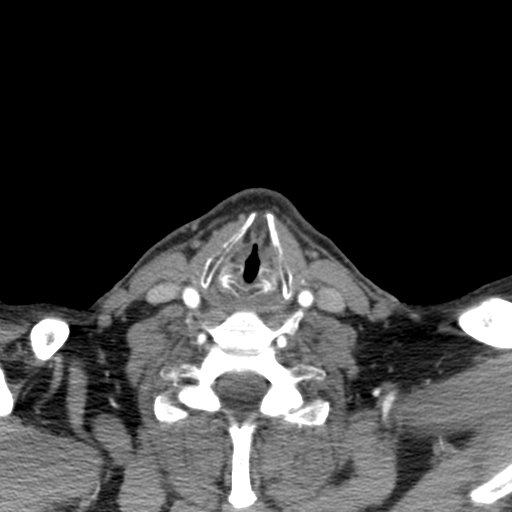
[im 133/199  soft-tissue]
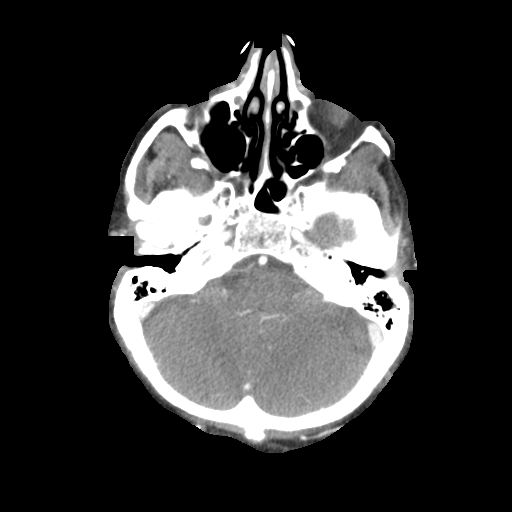

[Series 11: ax thin · axial · 0.39mm/px · z∈[-338,-74]mm · 5 of 398 slices shown]
[im 67/398  soft-tissue]
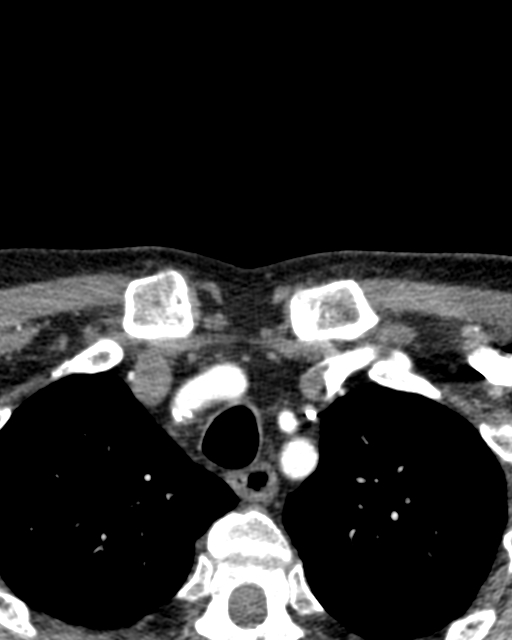
[im 133/398  bone]
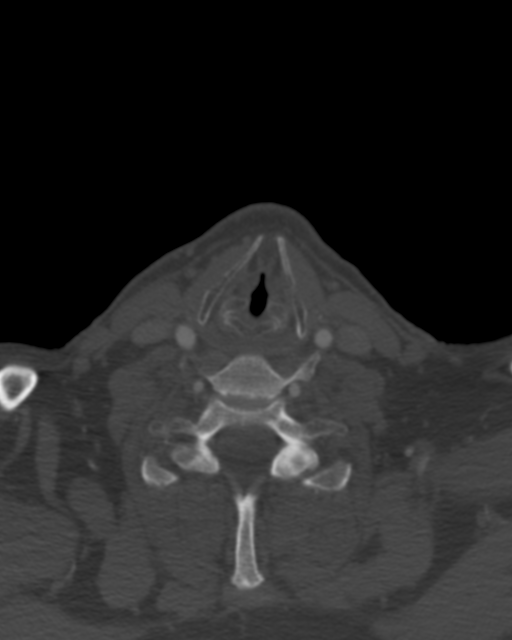
[im 199/398  soft-tissue]
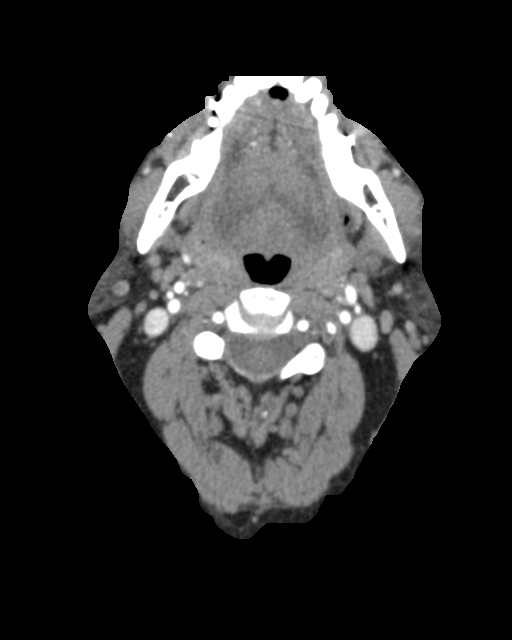
[im 265/398  bone]
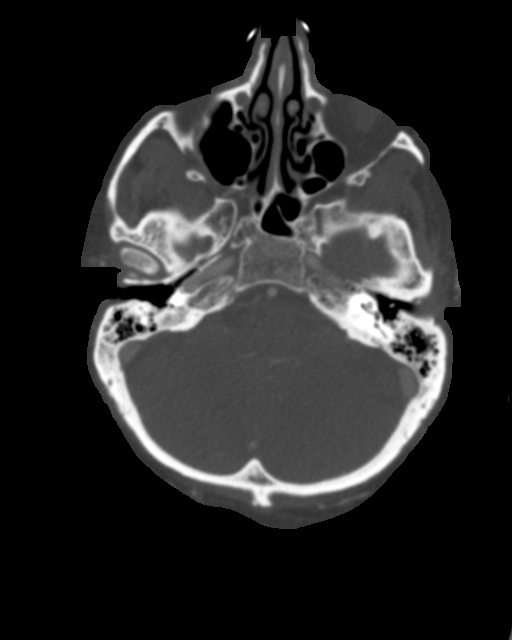
[im 331/398  soft-tissue]
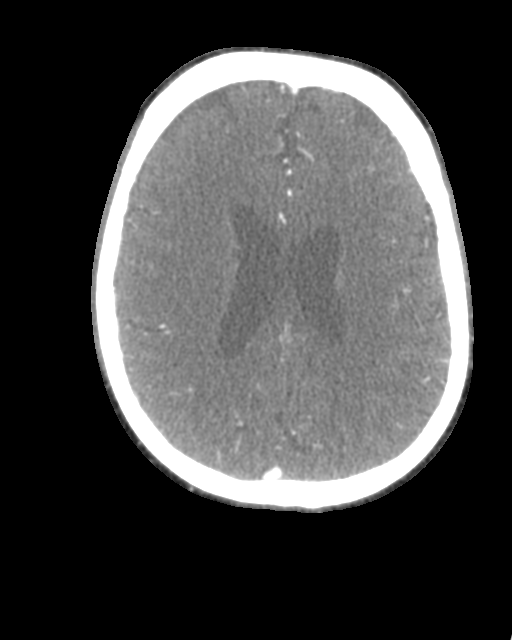

[7 of 33 positions shown; findings below may reference images not displayed]

FINDINGS: CT HEAD FINDINGS

Brain: White matter changes are again noted. No acute infarct,
hemorrhage, or mass lesion is present. The ventricles are of normal
size. No significant extraaxial fluid collection is present. The
brainstem and cerebellum are within normal limits.

Vascular: No hyperdense vessel or unexpected calcification.

Skull: Benign lucency in the right parietal skull stable. Calvarium
is intact. No other focal lytic or blastic lesions are present. No
significant extracranial soft tissue lesion is present.

Sinuses: The paranasal sinuses and mastoid air cells are clear. The
globes and orbits are within normal limits.

Review of the MIP images confirms the above findings

CTA NECK FINDINGS

Aortic arch: Atherosclerotic changes at the origin the left
subclavian artery are stable. No significant stenosis present. No
aneurysm is present.

Right carotid system: The right common carotid artery is within
normal limits. Bifurcation demonstrates minimal atherosclerotic
irregularity without stenosis. Right ICA otherwise within limits.

Left carotid system: Left common carotid artery demonstrates some
atherosclerotic irregularity without significant stenosis.
Bifurcation is unremarkable. Cervical left ICA is smaller than on
right. Is stable.

Vertebral arteries: Vertebral arteries are codominant. Both
vertebral arteries originate from the subclavian arteries without
significant stenosis.

Skeleton: Slight degenerative anterolisthesis at C6-7 is stable.
Vertebral body heights and alignment are otherwise normal. No focal
osseous lesions are present.

Other neck: Soft tissues the neck are otherwise unremarkable.
Salivary glands are within normal limits. Heterogeneous nodule
posterior in the left thyroid is stable and is being monitored by
ultrasound. No significant adenopathy is present. No focal mucosal
or submucosal lesions are present.

Upper chest: Lung apices are clear. Thoracic inlet is within normal
limits.

Review of the MIP images confirms the above findings

CTA HEAD FINDINGS

Anterior circulation: The internal carotid arteries are within
normal limits the skull base through the ICA termini bilaterally.
Proximal left M1 occlusion is stable. 50% narrowing of the proximal
right M1 occlusion is stable. Right MCA branch vessels are intact.
Collateral left MCA vessels noted. ACA vessels within normal limits
bilaterally.

Posterior circulation: The vertebral arteries are codominant. PICA
origins visualized and normal. Basilar artery is normal. Both
posterior cerebral arteries originate from basilar tip. The PCA
branch vessels are within normal limits.

Venous sinuses: The dural sinuses are patent. The straight sinus
deep cerebral veins are intact. Cortical veins are within normal
limits. No significant vascular malformation is evident.

Anatomic variants: None

Review of the MIP images confirms the above findings
IMPRESSION: 1. Stable appearance of proximal left M1 segment occlusion.
2. Stable 50% narrowing of the proximal right M1 occlusion.
3. Collateral left MCA vessels are stable.
4. No other significant proximal stenosis, aneurysm, or branch
vessel occlusion within the Circle of Willis.
5. Mild atherosclerotic changes in the neck are stable.
6. Stable heterogeneous nodule posterior left thyroid is stable and
is being monitored by ultrasound. This has been evaluated on
previous imaging. (ref: [HOSPITAL]. [DATE]): 143-50).
7. Aortic Atherosclerosis ([id number].X).

## 2022-05-19 ENCOUNTER — Other Ambulatory Visit: Payer: Self-pay | Admitting: Cardiovascular Disease

## 2022-05-26 DIAGNOSIS — G4733 Obstructive sleep apnea (adult) (pediatric): Secondary | ICD-10-CM | POA: Diagnosis not present

## 2022-06-01 ENCOUNTER — Telehealth: Payer: Self-pay

## 2022-06-01 ENCOUNTER — Other Ambulatory Visit (HOSPITAL_COMMUNITY): Payer: Self-pay

## 2022-06-01 DIAGNOSIS — I25118 Atherosclerotic heart disease of native coronary artery with other forms of angina pectoris: Secondary | ICD-10-CM

## 2022-06-01 DIAGNOSIS — I700 Atherosclerosis of aorta: Secondary | ICD-10-CM

## 2022-06-01 DIAGNOSIS — E785 Hyperlipidemia, unspecified: Secondary | ICD-10-CM

## 2022-06-01 NOTE — Telephone Encounter (Addendum)
Pharmacy Patient Advocate Encounter  Prior Authorization for Repatha '140mg'$ /ml has been approved.    key# [id number] Effective dates: 1.1.24 through 12.31.24   Please note that Rx Praluent is no longer listed on the patients formulary and Repatha is now the preferred drug and will need to be sent in to the patients preferred pharmacy.

## 2022-06-04 MED ORDER — REPATHA SURECLICK 140 MG/ML ~~LOC~~ SOAJ: 1.0000 mL | SUBCUTANEOUS | 3 refills | Status: DC

## 2022-06-06 ENCOUNTER — Other Ambulatory Visit: Payer: Self-pay | Admitting: Cardiology

## 2022-06-10 NOTE — Telephone Encounter (Signed)
*  STAT* If patient is at the pharmacy, call can be transferred to refill team.   1. Which medications need to be refilled? (please list name of each medication and dose if known)  Chlorthalidone  . Which pharmacy/location (including street and city if local pharmacy) is medication to be sent to?[facility]  [address], [place]  3. Do they need a 30 day or 90 day supply? #100 and refills

## 2022-06-23 ENCOUNTER — Other Ambulatory Visit: Payer: Self-pay | Admitting: Cardiology

## 2022-07-07 ENCOUNTER — Telehealth: Payer: Self-pay | Admitting: Cardiology

## 2022-07-07 NOTE — Telephone Encounter (Signed)
Spoke with pt he states that he would like to know when he should follow up with Gerald Stabs, [facility]. I looked at the last note and I do not see an appt. He also needs lab done. Informed pt of directions for our labs. He states that he will try to come in tomorrow definitely before the end of the week.

## 2022-07-07 NOTE — Telephone Encounter (Signed)
noted 

## 2022-07-07 NOTE — Telephone Encounter (Signed)
Did not need follow up at the time since he was starting Praluent. However, will be happy to follow up after lipid panel results come back

## 2022-07-07 NOTE — Telephone Encounter (Signed)
Pt called in reference to his appt with Karren Cobble back in July 2023. He said he wants to do what Gerald Stabs was telling him, and that he wants t to make sure his levels are good. He also stated that he has some other things he wants to do but stated he will communicate them with the nurse when they call.

## 2022-07-08 ENCOUNTER — Other Ambulatory Visit: Payer: Self-pay

## 2022-07-08 DIAGNOSIS — Z8673 Personal history of transient ischemic attack (TIA), and cerebral infarction without residual deficits: Secondary | ICD-10-CM

## 2022-07-08 DIAGNOSIS — I679 Cerebrovascular disease, unspecified: Secondary | ICD-10-CM | POA: Diagnosis not present

## 2022-07-08 DIAGNOSIS — I25118 Atherosclerotic heart disease of native coronary artery with other forms of angina pectoris: Secondary | ICD-10-CM | POA: Diagnosis not present

## 2022-07-09 LAB — LIPID PANEL
Chol/HDL Ratio: 3.6 ratio (ref 0.0–5.0)
Cholesterol, Total: 130 mg/dL (ref 100–199)
HDL: 36 mg/dL — ABNORMAL LOW (ref >39)
LDL Chol Calc (NIH): 40 mg/dL (ref 0–99)
Triglycerides: 365 mg/dL — ABNORMAL HIGH (ref 0–149)
VLDL Cholesterol Cal: 54 mg/dL — ABNORMAL HIGH (ref 5–40)

## 2022-07-09 LAB — COMPREHENSIVE METABOLIC PANEL
ALT: 17 IU/L (ref 0–44)
AST: 19 IU/L (ref 0–40)
Albumin/Globulin Ratio: 1.7 (ref 1.2–2.2)
Albumin: 4.2 g/dL (ref 3.8–4.8)
Alkaline Phosphatase: 83 IU/L (ref 44–121)
BUN/Creatinine Ratio: 17 (ref 10–24)
BUN: 18 mg/dL (ref 8–27)
Bilirubin Total: 0.4 mg/dL (ref 0.0–1.2)
CO2: 24 mmol/L (ref 20–29)
Calcium: 9.2 mg/dL (ref 8.6–10.2)
Chloride: 95 mmol/L — ABNORMAL LOW (ref 96–106)
Creatinine, Ser: 1.07 mg/dL (ref 0.76–1.27)
Globulin, Total: 2.5 g/dL (ref 1.5–4.5)
Glucose: 109 mg/dL — ABNORMAL HIGH (ref 70–99)
Potassium: 3.7 mmol/L (ref 3.5–5.2)
Sodium: 134 mmol/L (ref 134–144)
Total Protein: 6.7 g/dL (ref 6.0–8.5)
eGFR: 74 mL/min/{1.73_m2} (ref >59)

## 2022-08-10 ENCOUNTER — Telehealth: Payer: Self-pay | Admitting: Cardiology

## 2022-08-10 NOTE — Telephone Encounter (Signed)
Patient states that they feel very off balance for a couple of days now, its almost like the patient has vertigo. Patient stated they are unsure if it is heart related, but would like to speak with someone about it. Please advise.

## 2022-08-10 NOTE — Telephone Encounter (Signed)
Called pt. He states "I had to go to the bathroom last night and I was missed the whole door. When I get up and walk it's like I'm a drunk person at times." Pt took his BP after walking 159/80 67 repeat BP 135/80. Pt states "It might be vertigo." Pt will reach out to his PCP. Will get message to Dr. Antoine Poche for review. Pt would like message sent to Dr. Allena Katz as well (message routed to provider).

## 2022-09-02 DIAGNOSIS — Z0100 Encounter for examination of eyes and vision without abnormal findings: Secondary | ICD-10-CM | POA: Diagnosis not present

## 2022-09-02 DIAGNOSIS — H40033 Anatomical narrow angle, bilateral: Secondary | ICD-10-CM | POA: Diagnosis not present

## 2022-09-02 DIAGNOSIS — H02834 Dermatochalasis of left upper eyelid: Secondary | ICD-10-CM | POA: Diagnosis not present

## 2022-09-02 DIAGNOSIS — H02831 Dermatochalasis of right upper eyelid: Secondary | ICD-10-CM | POA: Diagnosis not present

## 2022-09-02 DIAGNOSIS — H2513 Age-related nuclear cataract, bilateral: Secondary | ICD-10-CM | POA: Diagnosis not present

## 2022-09-15 NOTE — Progress Notes (Unsigned)
Cardiology Office Note:   Date:  09/16/2022  ID:  Jesus Juarez, DOB 04/20/1951, MRN [id number]  History of Present Illness:   Jesus Juarez is a 72 y.o. male who presents for evaluation of hypertension.  He has a history of a normal cath 20 yrs ago. His last nuclear was in 2017 with low risk study. He had palpitations in 10/2017 and an event monitor with SR/ST. In ER in Feb 2020 he had hand numbness other symptoms suggestive of a possible TIA.  CT head was normal and MRI of brian without acute abnormality.     He has seen a neurologist.  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   He had an angiogram.    The L MCA had complete occlusion.   He had a 75% stenosis of the R MCA prox M1 segment as well.   He had symptoms of unstable angina and PCI of an RCA in Nov 2022.   He is being treated for sleep apnea.     Since I last saw him he has had episodes of dizziness.  This happened for couple of days a few weeks ago.  He felt like he could not get his balance.  He felt like a "drunk".  It was similar to vertigo but he thought it may be slightly different or worse.  He has had problems with vertigo in the past.  He might of had some palpitations but these are skipped beats and not necessarily associated with the abnormal gait.  He is not having any chest pressure, neck or arm discomfort.  He has a little bit of shortness of breath but it is not nearly as severe as it was at the time of his PCI.  He is not having any PND or orthopnea.  He is not having any edema.  He had no orthostatic symptoms.  Today in the office he was not orthostatic.    ROS: As stated in the HPI and negative for all other systems.  Studies Reviewed:    EKG: Sinus rhythm, rate 60, right bundle branch block, no acute ST-T wave changes.   Risk Assessment/Calculations:           Physical Exam:   VS:  BP 130/76 (BP Location: Left Arm, Patient Position: Sitting, Cuff Size:  Normal)   Pulse 60   Ht 5\' 11"  (1.803 m)   Wt 227 lb 6.4 oz (103.1 kg)   BMI 31.72 kg/m    Wt Readings from Last 3 Encounters:  09/16/22 227 lb 6.4 oz (103.1 kg)  02/22/22 228 lb (103.4 kg)  10/05/21 232 lb 9.6 oz (105.5 kg)     GEN: Well nourished, well developed in no acute distress NECK: No JVD; No carotid bruits CARDIAC: RRR, no murmurs, rubs, gallops RESPIRATORY:  Clear to auscultation without rales, wheezing or rhonchi  ABDOMEN: Soft, non-tender, non-distended EXTREMITIES:  No edema; No deformity   ASSESSMENT AND PLAN:   CAD:  The patient has no new sypmtoms.  No further cardiovascular testing is indicated.  We will continue with aggressive risk reduction and meds as listed.  DIZZINESS:    I will apply a 3-day monitor though I do not think this is probably an arrhythmia.  He was not orthostatic.  I would suggest this is probably more related to his vertigo and would have him follow-up with his ENT or primary.    HTN: The blood pressure is  at target.  No change in therapy.    NEUROVASCULAR DISEASE:   He is on DAPT because of his neurologic disorders.     DYSLIPIDEMIA:    His goal LDL was 40 with an HDL of 36.  No change in therapy.  SLEEP APNEA: He follows with Dr. Tresa Endo.       Signed, Rollene Rotunda, MD

## 2022-09-16 ENCOUNTER — Ambulatory Visit: Payer: Medicare HMO | Attending: Cardiology | Admitting: Cardiology

## 2022-09-16 ENCOUNTER — Encounter: Payer: Self-pay | Admitting: Cardiology

## 2022-09-16 ENCOUNTER — Ambulatory Visit (INDEPENDENT_AMBULATORY_CARE_PROVIDER_SITE_OTHER): Payer: Medicare HMO

## 2022-09-16 VITALS — BP 130/76 | HR 60 | Ht 71.0 in | Wt 227.4 lb

## 2022-09-16 DIAGNOSIS — G4730 Sleep apnea, unspecified: Secondary | ICD-10-CM

## 2022-09-16 DIAGNOSIS — E785 Hyperlipidemia, unspecified: Secondary | ICD-10-CM

## 2022-09-16 DIAGNOSIS — I10 Essential (primary) hypertension: Secondary | ICD-10-CM | POA: Diagnosis not present

## 2022-09-16 DIAGNOSIS — I2510 Atherosclerotic heart disease of native coronary artery without angina pectoris: Secondary | ICD-10-CM

## 2022-09-16 DIAGNOSIS — R42 Dizziness and giddiness: Secondary | ICD-10-CM

## 2022-09-16 NOTE — Patient Instructions (Signed)
Medication Instructions:  Your physician recommends that you continue on your current medications as directed. Please refer to the Current Medication list given to you today.  *If you need a refill on your cardiac medications before your next appointment, please call your pharmacy*  Testing/Procedures: Elaine Monitor Instructions   Your physician has requested you wear a ZIO patch monitor for _3_ days.  This is a single patch monitor.   [employer] supplies one patch monitor per enrollment. Additional stickers are not available. Please do not apply patch if you will be having a Nuclear Stress Test, Echocardiogram, Cardiac CT, MRI, or Chest Xray during the period you would be wearing the monitor. The patch cannot be worn during these tests. You cannot remove and re-apply the ZIO XT patch monitor.  Your ZIO patch monitor will be sent Fed Ex from [employer] directly to your home address. It may take 3-5 days to receive your monitor after you have been enrolled.  Once you have received your monitor, please review the enclosed instructions. Your monitor has already been registered assigning a specific monitor serial # to you.  Billing and Patient Assistance Program Information   We have supplied [employer] with any of your insurance information on file for billing purposes. [employer] offers a sliding scale Patient Assistance Program for patients that do not have insurance, or whose insurance does not completely cover the cost of the ZIO monitor.   You must apply for the Patient Assistance Program to qualify for this discounted rate.     To apply, please call [employer] at [phone], select option 4, then select option 2, and ask to apply for Patient Assistance Program.  Theodore Demark will ask your household income, and how many people are in your household.  They will quote your out-of-pocket cost based on that information.  [employer] will also be able to set up a 55-month interest-free payment plan  if needed.  Applying the monitor   Shave hair from upper left chest.  Hold abrader disc by orange tab. Rub abrader in 40 strokes over the upper left chest as indicated in your monitor instructions.  Clean area with 4 enclosed alcohol pads. Let dry.  Apply patch as indicated in monitor instructions. Patch will be placed under collarbone on left side of chest with arrow pointing upward.  Rub patch adhesive wings for 2 minutes. Remove white label marked "1". Remove the white label marked "2". Rub patch adhesive wings for 2 additional minutes.  While looking in a mirror, press and release button in center of patch. A small green light will flash 3-4 times. This will be your only indicator that the monitor has been turned on. ?  Do not shower for the first 24 hours. You may shower after the first 24 hours.  Press the button if you feel a symptom. You will hear a small click. Record Date, Time and Symptom in the Patient Logbook.  When you are ready to remove the patch, follow instructions on the last 2 pages of the Patient Logbook. Stick patch monitor onto the last page of Patient Logbook.  Place Patient Logbook in the blue and white box.  Use locking tab on box and tape box closed securely.  The blue and white box has prepaid postage on it. Please place it in the mailbox as soon as possible. Your physician should have your test results approximately 7 days after [facility] monitor has [facility]  Call [facility]  at [phone] if you have questions regarding your ZIO XT patch monitor. Call them immediately if you see an orange light blinking on your monitor.  If your monitor falls off in less than 4 days, contact our Monitor department at [phone]. ?If your monitor becomes loose or falls off after 4 days call [employer] at [phone] for suggestions on securing your monitor.?  Follow-Up: At [facility], you and your health needs are our priority.  As  part of our continuing mission to provide you with exceptional heart care, we have created designated Provider Care Teams.  These Care Teams include your primary Cardiologist (physician) and Advanced Practice Providers (APPs -  Physician Assistants and Nurse Practitioners) who all work together to provide you with the care you need, when you need it.  We recommend signing up for the patient portal called "MyChart".  Sign up information is provided on this After Visit Summary.  MyChart is used to connect with patients for Virtual Visits (Telemedicine).  Patients are able to view lab/test results, encounter notes, upcoming appointments, etc.  Non-urgent messages can be sent to your provider as well.   To learn more about what you can do with MyChart, go to [email or web address].    Your next appointment:   12 month(s)  Provider:   Rollene Rotunda, MD

## 2022-09-16 NOTE — Progress Notes (Unsigned)
Enrolled patient for a 3 day Zio XT monitor to be mailed to patients home  

## 2022-09-25 DIAGNOSIS — R42 Dizziness and giddiness: Secondary | ICD-10-CM

## 2022-10-04 DIAGNOSIS — R42 Dizziness and giddiness: Secondary | ICD-10-CM | POA: Diagnosis not present

## 2022-11-14 ENCOUNTER — Encounter: Payer: Self-pay | Admitting: Cardiology

## 2022-11-14 ENCOUNTER — Encounter: Payer: Self-pay | Admitting: Neurology

## 2022-11-17 DIAGNOSIS — H6123 Impacted cerumen, bilateral: Secondary | ICD-10-CM | POA: Diagnosis not present

## 2022-11-23 ENCOUNTER — Encounter: Payer: Self-pay | Admitting: Cardiology

## 2022-12-02 ENCOUNTER — Telehealth: Payer: Self-pay | Admitting: *Deleted

## 2022-12-02 NOTE — Telephone Encounter (Signed)
   Patient Name: Jesus Juarez  DOB: [birth year] MRN: [id number]  Primary Cardiologist: Rollene Rotunda, MD  Chart reviewed as part of pre-operative protocol coverage.   Dental extractions of 1-2 teeth are considered low risk procedures per guidelines and generally do not require any specific cardiac clearance. It is also generally accepted that for extractions of 1-2 teeth and dental cleanings, there is no need to interrupt blood thinner therapy.  SBE prophylaxis is not required for the patient from a cardiac standpoint.  I will route this recommendation to the requesting party via Epic fax function and remove from pre-op pool.  Please call with questions.  Carlos Levering, NP 12/02/2022, 4:10 PM

## 2022-12-02 NOTE — Telephone Encounter (Signed)
   Pre-operative Risk Assessment    Patient Name: Erion Gaunce  DOB: 1950-10-05 MRN: [id number]     Request for Surgical Clearance    Procedure:  Dental Extraction - Amount of Teeth to be Pulled:  1  Date of Surgery:  Clearance TBD                                 Surgeon:   Surgeon's Group or Practice Name:  [facility] Phone number:  [phone] Fax number:  [phone]   Type of Clearance Requested:   - Medical  - Pharmacy:  Hold Clopidogrel (Plavix) NOT INDICATED   Type of Anesthesia:  Local    Additional requests/questions:    Wilhemina Cash   12/02/2022, 3:45 PM

## 2022-12-05 ENCOUNTER — Other Ambulatory Visit: Payer: Self-pay | Admitting: Cardiology

## 2023-01-02 ENCOUNTER — Other Ambulatory Visit: Payer: Self-pay | Admitting: Cardiology

## 2023-01-06 DIAGNOSIS — E041 Nontoxic single thyroid nodule: Secondary | ICD-10-CM | POA: Diagnosis not present

## 2023-01-06 DIAGNOSIS — Z1389 Encounter for screening for other disorder: Secondary | ICD-10-CM | POA: Diagnosis not present

## 2023-01-06 DIAGNOSIS — Z125 Encounter for screening for malignant neoplasm of prostate: Secondary | ICD-10-CM | POA: Diagnosis not present

## 2023-01-06 DIAGNOSIS — E669 Obesity, unspecified: Secondary | ICD-10-CM | POA: Diagnosis not present

## 2023-01-06 DIAGNOSIS — Z0000 Encounter for general adult medical examination without abnormal findings: Secondary | ICD-10-CM | POA: Diagnosis not present

## 2023-01-06 DIAGNOSIS — R7303 Prediabetes: Secondary | ICD-10-CM | POA: Diagnosis not present

## 2023-01-10 DIAGNOSIS — R7303 Prediabetes: Secondary | ICD-10-CM | POA: Diagnosis not present

## 2023-01-10 DIAGNOSIS — R972 Elevated prostate specific antigen [PSA]: Secondary | ICD-10-CM | POA: Diagnosis not present

## 2023-01-10 DIAGNOSIS — I700 Atherosclerosis of aorta: Secondary | ICD-10-CM | POA: Diagnosis not present

## 2023-01-10 DIAGNOSIS — G720 Drug-induced myopathy: Secondary | ICD-10-CM | POA: Diagnosis not present

## 2023-01-25 ENCOUNTER — Other Ambulatory Visit (HOSPITAL_COMMUNITY): Payer: Self-pay | Admitting: Interventional Radiology

## 2023-01-25 DIAGNOSIS — I771 Stricture of artery: Secondary | ICD-10-CM

## 2023-01-26 DIAGNOSIS — H6123 Impacted cerumen, bilateral: Secondary | ICD-10-CM | POA: Diagnosis not present

## 2023-02-03 ENCOUNTER — Ambulatory Visit (HOSPITAL_COMMUNITY)
Admission: RE | Admit: 2023-02-03 | Discharge: 2023-02-03 | Disposition: A | Discharge status: Home / Self Care | Payer: Medicare HMO | Source: Ambulatory Visit | Attending: Interventional Radiology | Admitting: Interventional Radiology

## 2023-02-03 DIAGNOSIS — I771 Stricture of artery: Secondary | ICD-10-CM | POA: Insufficient documentation

## 2023-02-03 DIAGNOSIS — I700 Atherosclerosis of aorta: Secondary | ICD-10-CM | POA: Diagnosis not present

## 2023-02-03 DIAGNOSIS — I672 Cerebral atherosclerosis: Secondary | ICD-10-CM | POA: Diagnosis not present

## 2023-02-03 DIAGNOSIS — I6523 Occlusion and stenosis of bilateral carotid arteries: Secondary | ICD-10-CM | POA: Diagnosis not present

## 2023-02-03 MED ORDER — IOHEXOL 350 MG/ML SOLN
75.0000 mL | Freq: Once | INTRAVENOUS | Status: AC | PRN
  Administered 2023-02-03: 75 mL via INTRAVENOUS

## 2023-02-07 ENCOUNTER — Telehealth (HOSPITAL_COMMUNITY): Payer: Self-pay

## 2023-02-07 NOTE — Telephone Encounter (Signed)
Pt agreed to f/u in 1 year with a cta head and neck. AB

## 2023-02-09 IMAGING — US US RENAL
1 series · 14 of 25 positions shown · non-contrast
Comparison: None Available.

CLINICAL DATA: Right flank pain for a month.

EXAM:
RENAL / URINARY TRACT ULTRASOUND COMPLETE

[Series 1: us renal · 0.23mm/px · 14 of 45 slices shown]
[im 1/45]
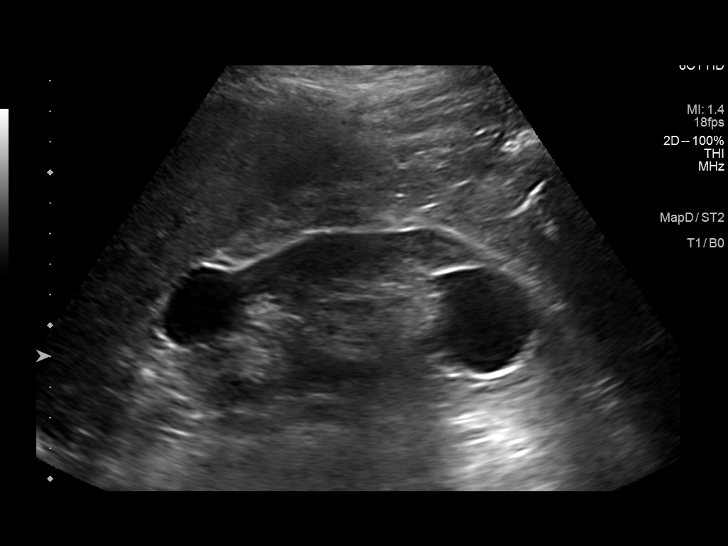
[im 4/45]
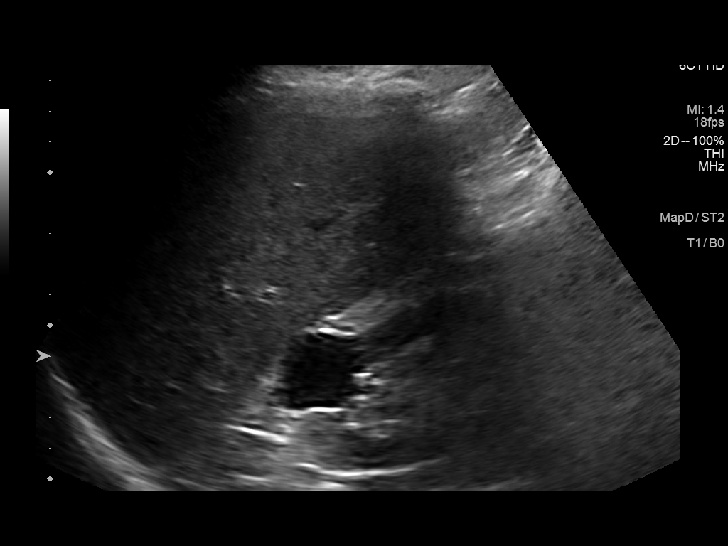
[im 8/45]
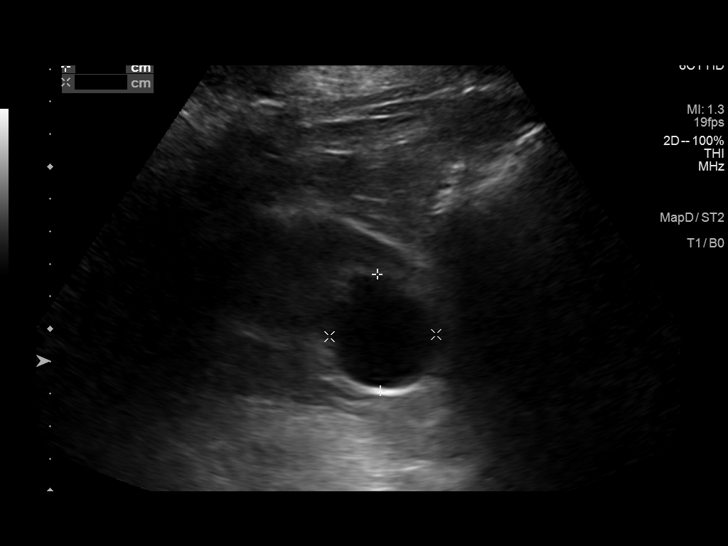
[im 12/45]
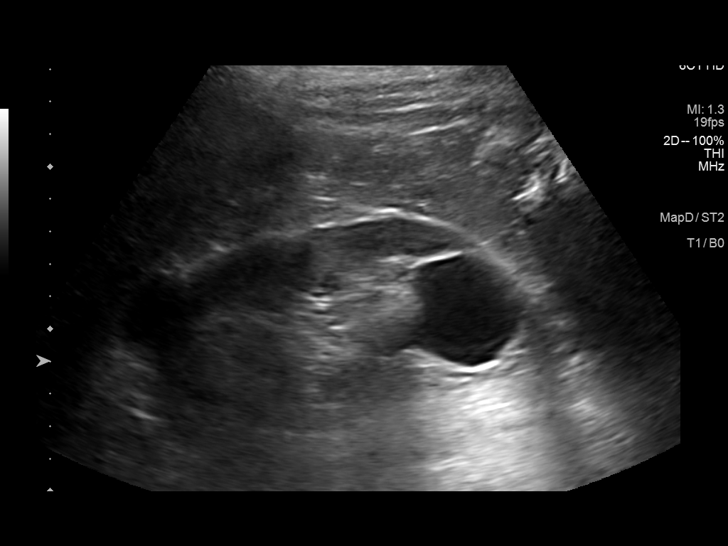
[im 15/45]
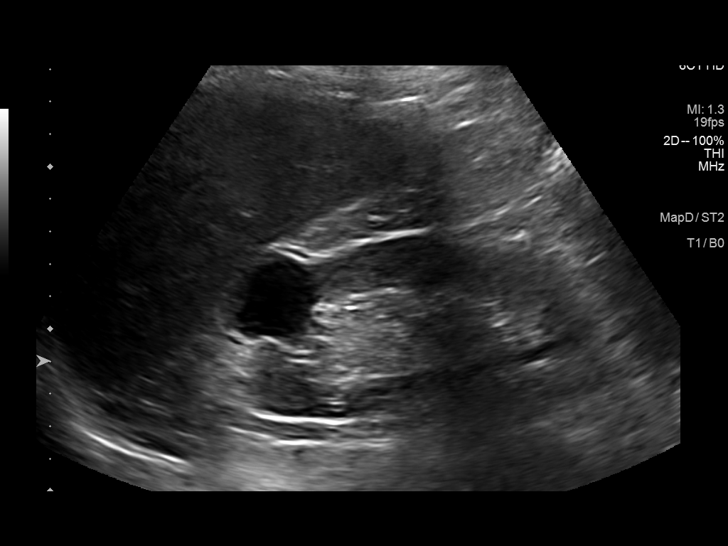
[im 17/45]
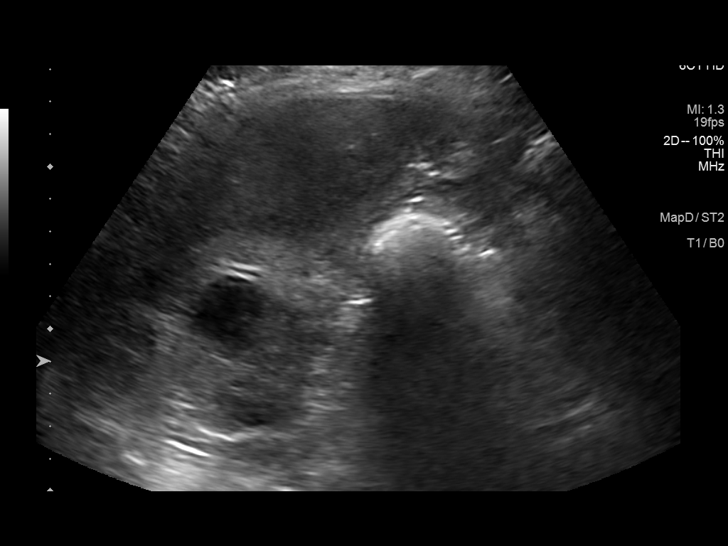
[im 21/45]
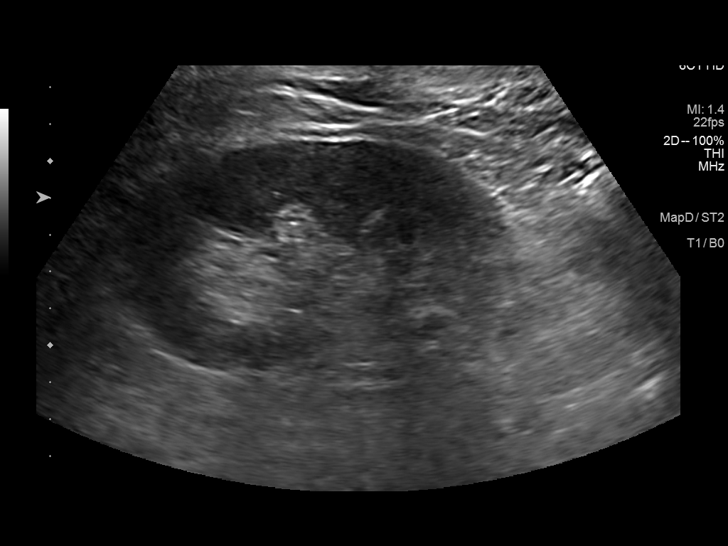
[im 24/45]
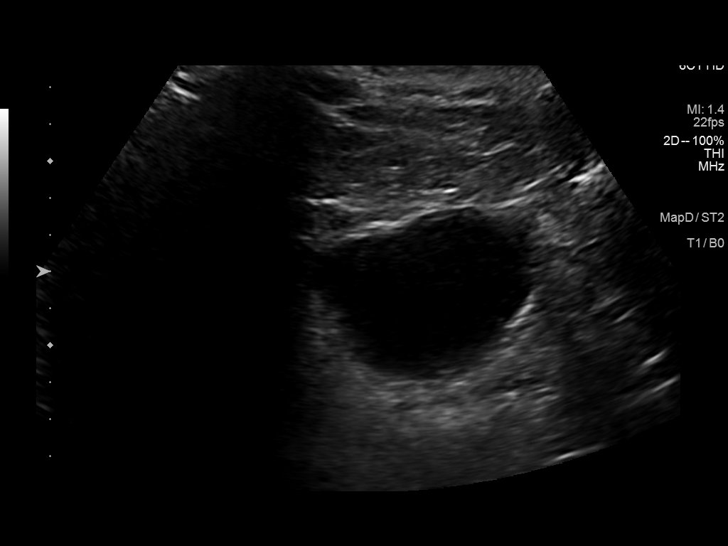
[im 28/45]
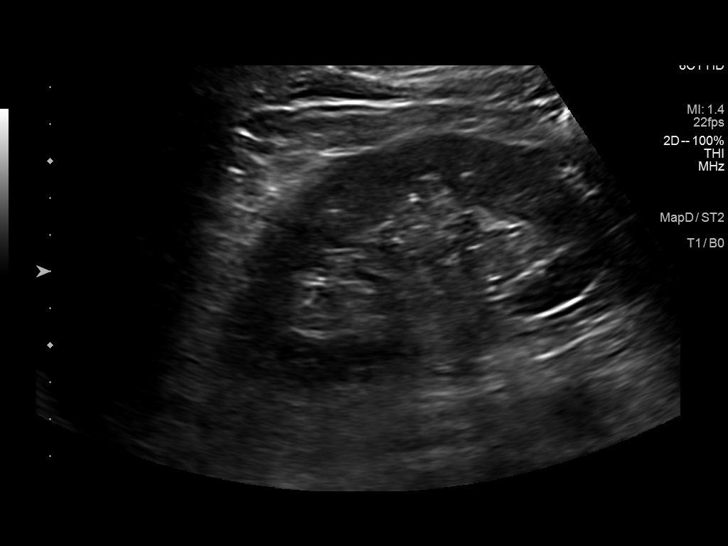
[im 30/45]
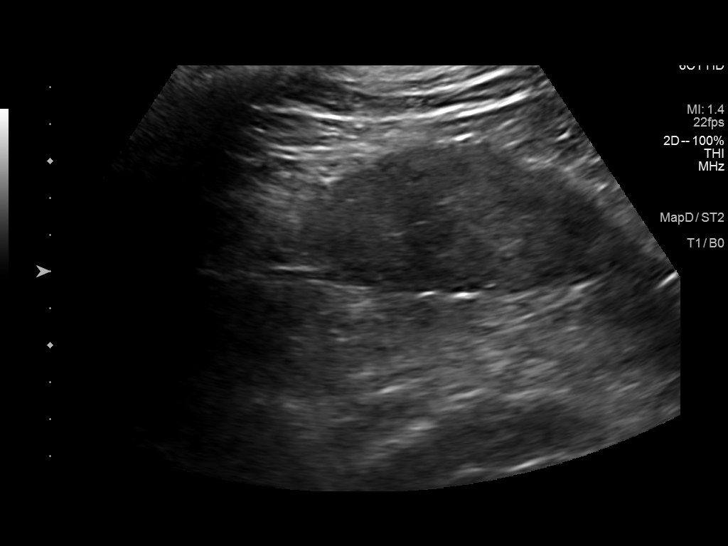
[im 34/45]
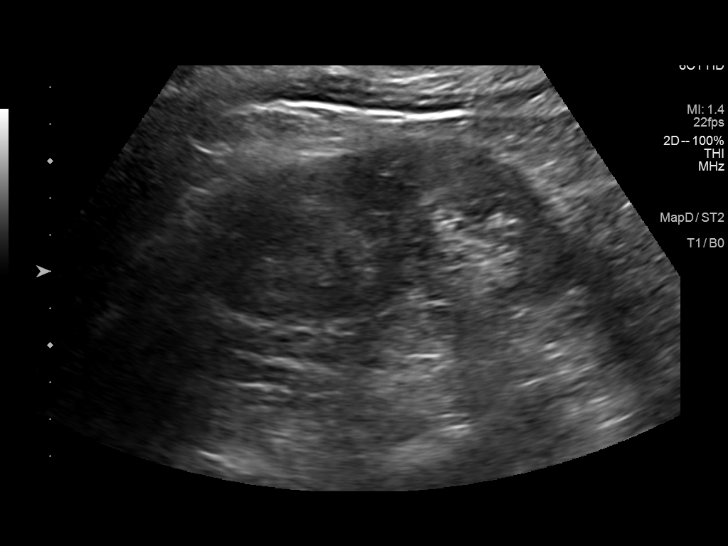
[im 37/45]
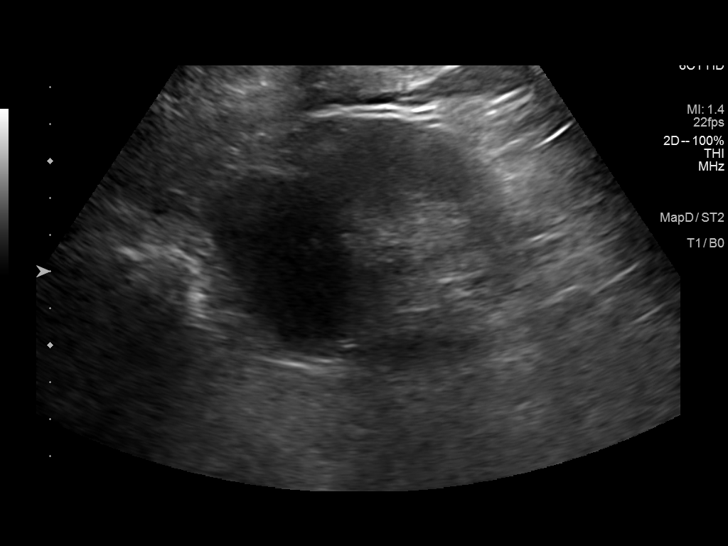
[im 41/45]
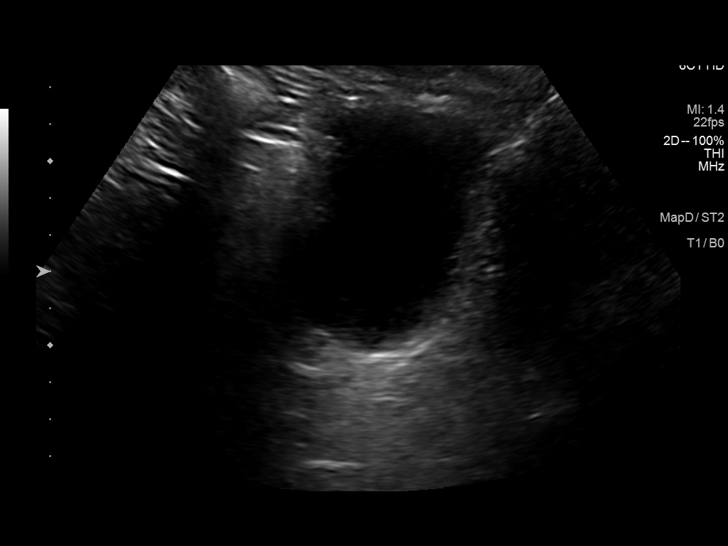
[im 45/45]
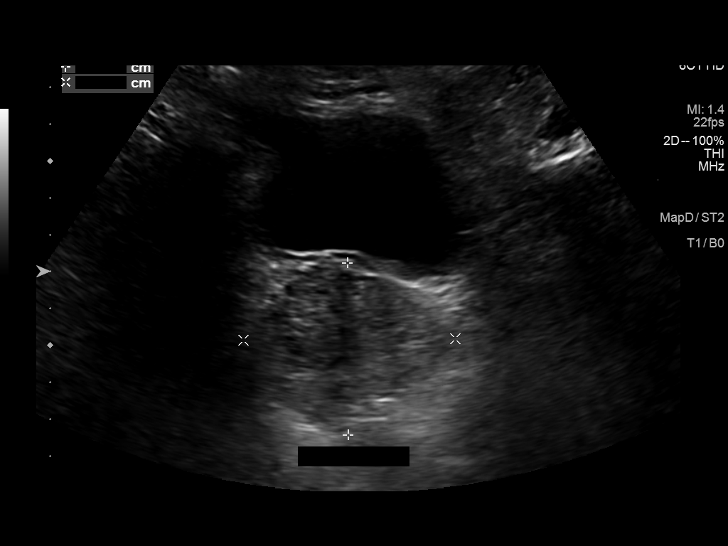

[14 of 25 positions shown; findings below may reference images not displayed]

FINDINGS: Right Kidney:

Renal measurements: 11.8 x 5.3 x 5.6 cm = volume: 182 mL. Contains 2
cysts. The cyst in the upper pole measures 3.1 cm. The cyst in the
lower pole measures 3.3 cm. The cysts are simple in appearance and
no follow-up imaging is recommended.

Left Kidney:

Renal measurements: 10.5 x 6.0 x 4.8 cm = volume: 158 mL. Contains a
6.1 cm simple cyst in the midpole of no clinical significance. No
follow-up imaging recommended for the cysts.

Bladder:

Appears normal for degree of bladder distention.

Other:

The prostate is enlarged measuring 4.6 x 4.7 x 5.8 cm.
IMPRESSION: 1. The kidneys are unremarkable.
2. The bladder is unremarkable.
3. The prostate is enlarged measuring 4.6 by 4.7 x 5.8 cm with a
volume of 65.7 cc.

## 2023-02-17 ENCOUNTER — Ambulatory Visit (INDEPENDENT_AMBULATORY_CARE_PROVIDER_SITE_OTHER): Payer: Medicare HMO

## 2023-02-18 DIAGNOSIS — R972 Elevated prostate specific antigen [PSA]: Secondary | ICD-10-CM | POA: Diagnosis not present

## 2023-03-10 DIAGNOSIS — I8392 Asymptomatic varicose veins of left lower extremity: Secondary | ICD-10-CM | POA: Diagnosis not present

## 2023-03-10 DIAGNOSIS — L905 Scar conditions and fibrosis of skin: Secondary | ICD-10-CM | POA: Diagnosis not present

## 2023-03-10 DIAGNOSIS — L570 Actinic keratosis: Secondary | ICD-10-CM | POA: Diagnosis not present

## 2023-03-10 DIAGNOSIS — L814 Other melanin hyperpigmentation: Secondary | ICD-10-CM | POA: Diagnosis not present

## 2023-03-10 DIAGNOSIS — D225 Melanocytic nevi of trunk: Secondary | ICD-10-CM | POA: Diagnosis not present

## 2023-03-10 DIAGNOSIS — D492 Neoplasm of unspecified behavior of bone, soft tissue, and skin: Secondary | ICD-10-CM | POA: Diagnosis not present

## 2023-03-10 DIAGNOSIS — L821 Other seborrheic keratosis: Secondary | ICD-10-CM | POA: Diagnosis not present

## 2023-04-13 ENCOUNTER — Telehealth: Payer: Self-pay | Admitting: Cardiovascular Disease

## 2023-04-13 NOTE — Telephone Encounter (Signed)
Pt trying to get a supplies for his sleep from a new supplier. Please advise

## 2023-04-13 NOTE — Telephone Encounter (Signed)
Notified patient that I will help set him up with new DME [employer] for new supplies. Patient will come in to office to pick up sample mask from sleep coordinator.

## 2023-04-22 ENCOUNTER — Ambulatory Visit (INDEPENDENT_AMBULATORY_CARE_PROVIDER_SITE_OTHER): Payer: Medicare HMO

## 2023-04-22 ENCOUNTER — Encounter (INDEPENDENT_AMBULATORY_CARE_PROVIDER_SITE_OTHER): Payer: Self-pay

## 2023-04-22 VITALS — Ht 71.0 in | Wt 220.0 lb

## 2023-04-22 DIAGNOSIS — H6123 Impacted cerumen, bilateral: Secondary | ICD-10-CM

## 2023-04-22 DIAGNOSIS — H9042 Sensorineural hearing loss, unilateral, left ear, with unrestricted hearing on the contralateral side: Secondary | ICD-10-CM

## 2023-04-22 DIAGNOSIS — G4733 Obstructive sleep apnea (adult) (pediatric): Secondary | ICD-10-CM | POA: Diagnosis not present

## 2023-04-22 DIAGNOSIS — H8102 Meniere's disease, left ear: Secondary | ICD-10-CM | POA: Diagnosis not present

## 2023-04-22 DIAGNOSIS — H608X3 Other otitis externa, bilateral: Secondary | ICD-10-CM

## 2023-04-22 DIAGNOSIS — R42 Dizziness and giddiness: Secondary | ICD-10-CM

## 2023-04-22 NOTE — Progress Notes (Unsigned)
Patient ID: Jesus Juarez, male   DOB: 1950/11/28, 72 y.o.   MRN: [id number]  Follow-up: Recurrent dizziness, Mnire's disease, left ear hearing loss, recurrent cerumen impaction  HPI: The patient is a 72 year old male who returns today for his follow-up evaluation.  The patient was previously seen for recurrent dizziness, asymmetric left ear hearing loss, left ear Mnire's disease, and recurrent cerumen impaction.  The patient was treated with a 1500 mg low-salt diet.  The patient returns today reporting no significant dizziness over the past 6 months.  However, he has noted frequent itchy sensation in his ears.  He is still having difficulty hearing on the left side.  He has not noted any significant change in his hearing recently.  Exam: General: Communicates without difficulty, well nourished, no acute distress. Head: Normocephalic, no evidence injury, no tenderness, facial buttresses intact without stepoff. Face/sinus: No tenderness to palpation and percussion. Facial movement is normal and symmetric. Eyes: PERRL, EOMI. No scleral icterus, conjunctivae clear. Neuro: CN II exam reveals vision grossly intact.  No nystagmus at any point of gaze. Ears: Auricles well formed without lesions.  Bilateral cerumen impaction.  Nose: External evaluation reveals normal support and skin without lesions.  Dorsum is intact.  Anterior rhinoscopy reveals congested mucosa over anterior aspect of inferior turbinates and intact septum.  No purulence noted. Oral:  Oral cavity and oropharynx are intact, symmetric, without erythema or edema.  Mucosa is moist without lesions. Neck: Full range of motion without pain.  There is no significant lymphadenopathy.  No masses palpable.  Thyroid bed within normal limits to palpation.  Parotid glands and submandibular glands equal bilaterally without mass.  Trachea is midline. Neuro:  CN 2-12 grossly intact.    Procedure: Bilateral cerumen disimpaction Anesthesia:  None Description: Under the operating microscope, the cerumen is carefully removed with a combination of cerumen currette, alligator forceps, and suction catheters.  After the cerumen is removed, the TMs are noted to be normal.  Eczematous changes are noted within both ear canals.  No mass, erythema, or lesions. The patient tolerated the procedure well.    Assessment: 1.  Bilateral recurrent cerumen impaction.  After the cerumen disimpaction procedure, eczematous changes are noted in both ear canals. 2.  The patient's left ear Mnire's disease and dizziness are currently under control with the low-salt diet. 3.  Subjectively stable asymmetric left ear sensorineural hearing loss.  Plan: 1.  Otomicroscopy with bilateral cerumen disimpaction. 2.  Elocon cream to treat the chronic eczematous otitis externa. 3.  Continue the 1500 mg low-salt diet. 4.  The patient will return for reevaluation in 4 months.

## 2023-04-24 DIAGNOSIS — H6123 Impacted cerumen, bilateral: Secondary | ICD-10-CM | POA: Insufficient documentation

## 2023-04-24 DIAGNOSIS — H9042 Sensorineural hearing loss, unilateral, left ear, with unrestricted hearing on the contralateral side: Secondary | ICD-10-CM | POA: Insufficient documentation

## 2023-04-24 DIAGNOSIS — H608X3 Other otitis externa, bilateral: Secondary | ICD-10-CM | POA: Insufficient documentation

## 2023-05-08 ENCOUNTER — Other Ambulatory Visit: Payer: Self-pay | Admitting: Cardiology

## 2023-05-08 DIAGNOSIS — E785 Hyperlipidemia, unspecified: Secondary | ICD-10-CM

## 2023-05-08 DIAGNOSIS — I25118 Atherosclerotic heart disease of native coronary artery with other forms of angina pectoris: Secondary | ICD-10-CM

## 2023-05-08 DIAGNOSIS — I700 Atherosclerosis of aorta: Secondary | ICD-10-CM

## 2023-05-25 ENCOUNTER — Other Ambulatory Visit: Payer: Self-pay | Admitting: Urology

## 2023-05-25 DIAGNOSIS — R972 Elevated prostate specific antigen [PSA]: Secondary | ICD-10-CM

## 2023-06-06 ENCOUNTER — Telehealth: Payer: Self-pay | Admitting: Cardiology

## 2023-06-06 NOTE — Telephone Encounter (Signed)
Called and spoke to patient. Verified name and DOB.  Patient report he has been more SOB when walking, bending over tying his shoes, and climbing a hill. He also report about 1 week ago he had heart flutters and had to lean against the wall because he felt weak. He deny any other symptoms at this time. Appt scheduled for 2/4 with Joni Reining at 11:20. Patient advised per ED precaution. Patient verbalized understanding and agree.

## 2023-06-06 NOTE — Telephone Encounter (Signed)
Pt c/o Shortness Of Breath: STAT if SOB developed within the last 24 hours or pt is noticeably SOB on the phone  1. Are you currently SOB (can you hear that pt is SOB on the phone)?  No   2. How long have you been experiencing SOB?  Over the past month  3. Are you SOB when sitting or when up moving around?  When up and moving around   4. Are you currently experiencing any other symptoms?  Dizziness + palpitations (not currently)

## 2023-06-07 ENCOUNTER — Encounter: Payer: Self-pay | Admitting: Adult Health

## 2023-06-07 ENCOUNTER — Ambulatory Visit: Payer: Medicare HMO | Attending: Adult Health | Admitting: Adult Health

## 2023-06-07 VITALS — BP 140/76 | HR 74 | Ht 71.0 in | Wt 232.0 lb

## 2023-06-07 DIAGNOSIS — E6609 Other obesity due to excess calories: Secondary | ICD-10-CM | POA: Diagnosis not present

## 2023-06-07 DIAGNOSIS — I2510 Atherosclerotic heart disease of native coronary artery without angina pectoris: Secondary | ICD-10-CM | POA: Diagnosis not present

## 2023-06-07 DIAGNOSIS — I10 Essential (primary) hypertension: Secondary | ICD-10-CM | POA: Diagnosis not present

## 2023-06-07 DIAGNOSIS — I2583 Coronary atherosclerosis due to lipid rich plaque: Secondary | ICD-10-CM | POA: Diagnosis not present

## 2023-06-07 DIAGNOSIS — E66811 Obesity, class 1: Secondary | ICD-10-CM | POA: Diagnosis not present

## 2023-06-07 DIAGNOSIS — Z6832 Body mass index (BMI) 32.0-32.9, adult: Secondary | ICD-10-CM

## 2023-06-07 DIAGNOSIS — R079 Chest pain, unspecified: Secondary | ICD-10-CM

## 2023-06-07 DIAGNOSIS — E7800 Pure hypercholesterolemia, unspecified: Secondary | ICD-10-CM

## 2023-06-07 NOTE — Progress Notes (Signed)
 Cardiology Office Note:  .   Date:  06/07/2023  ID:  Jesus Juarez, DOB 02-13-1951, MRN [id number] PCP: Cleotilde Planas, MD   HeartCare Providers Cardiologist:  Lynwood Schilling, MD {  History of Present Illness: .   Jesus Juarez is a 73 y.o. male history of CAD, DES to the RCA in 2022, mild nonobstructive disease with plaquing involving the left main LAD and left circumflex but there was no high-grade stenosis in any of those vessels.    Hypertension, TIA, OSA, Mnire's disease, and dyslipidemia.  Called our office on 06/06/2023 with complaints of dyspnea when climbing stairs and heart fluttering.  This was transient but asked for an appointment to be seen concerning his symptoms.  He states he and his wife walk daily, about an hour to 45 minutes and a park.  There are hills and flat spaces.  He has noticed that he has become more winded, he also has symptoms of bendopnea.  He is feeling more fatigued and tired than normal.  He also noticed that his heart rate became elevated briefly while he was standing lasting a few seconds and returning to normal.  Due to these symptoms he has become concerned that his coronary artery disease has progressed.  He denies frank chest pain, significant dizziness but did feel presyncopal with a racing heart rate.  He has been medically compliant.  He offers no complaints of bleeding on clopidogrel .  He has been compliant with Repatha .  He has not had any labs drawn recently and is requesting these to be done.  ROS: As above otherwise negative  Studies Reviewed: [facility]      [facility] 03/27/2021    Prox RCA lesion is 90% stenosed.   A drug-eluting stent was successfully placed using a STENT ONYX FRONTIER 3.5X30.   Post intervention, there is a 0% residual stenosis.   1.  Severe single-vessel coronary artery disease with 90% eccentric stenosis of the proximal RCA, treated with PCI using a 3.5 x 30 mm Onyx frontier drug-eluting stent 2.  Mild diffuse  nonobstructive plaquing involving the left main, LAD, and left circumflex.  There are no high-grade stenoses in any of these vessels. 3.  Normal LVEDP  Recommendations: Ongoing aggressive medical therapy for secondary risk reduction, dual antiplatelet therapy with aspirin  and clopidogrel  for at least 6 months without interruption, same-day PCI protocol as long as no early complications arise.   Physical Exam:   VS:  BP (!) 140/76 (BP Location: Left Arm, Patient Position: Sitting, Cuff Size: Normal)   Pulse 74   Ht 5' 11 (1.803 m)   Wt 232 lb (105.2 kg)   SpO2 97%   BMI 32.36 kg/m    Wt Readings from Last 3 Encounters:  06/07/23 232 lb (105.2 kg)  04/22/23 220 lb (99.8 kg)  09/16/22 227 lb 6.4 oz (103.1 kg)    GEN: Well nourished, well developed in no acute distress NECK: No JVD; No carotid bruits CARDIAC: RRR, soft systolic murmur left sternal border, no rubs, gallops RESPIRATORY:  Clear to auscultation without rales, wheezing or rhonchi  ABDOMEN: Soft, non-tender, non-distended EXTREMITIES:  No edema; No deformity   ASSESSMENT AND PLAN: .    Coronary artery disease: History of single-vessel disease as with drug-eluting stent to the RCA in 2022 with mild nonobstructive disease and plaquing involving the left main, LAD, and left circumflex.  I am going to schedule him for cardiac PET scan for diagnostic prognostic purposes.  I have explained this  to the patient who verbalizes understanding and is willing to proceed.  2.  Hypertension: Repeating echocardiogram to evaluate for changes in LV function, and also to evaluate tricuspid valve.  3.  Hyperlipidemia: Remains on Repatha .  Fasting lipids along with a CMET is being ordered to evaluate his status.  With blood draw he is requesting a TSH and a vitamin D  level be drawn as well.  Vitamin D  and TSH will be drawn due to fatigue and history of vitamin D  deficiency.  4.  History of TIA: No recurrence of any symptoms, fugue, significant  dizziness, neurologic symptoms or changes in vision.         Signed, Lamarr HERO. Jerilynn CHOL, ANP, AACC

## 2023-06-07 NOTE — Patient Instructions (Addendum)
 Medication Instructions:  No changes *If you need a refill on your cardiac medications before your next appointment, please call your pharmacy*  Lab Work: At your convenience we would like a fasting lipid panel, Cmet, TSH, and Vitamin D  levels  If you have labs (blood work) drawn today and your tests are completely normal, you will receive your results only by: MyChart Message (if you have MyChart) OR A paper copy in the mail If you have any lab test that is abnormal or we need to change your treatment, we will call you to review the results.  Testing/Procedures: Your physician has requested that you have an echocardiogram. Echocardiography is a painless test that uses sound waves to create images of your heart. It provides your doctor with information about the size and shape of your heart and how well your heart's chambers and valves are working. This procedure takes approximately one hour. There are no restrictions for this procedure. Please do NOT wear cologne, perfume, aftershave, or lotions (deodorant is allowed). Please arrive 15 minutes prior to your appointment time.  Please note: We ask at that you not bring children with you during ultrasound (echo/ vascular) testing. Due to room size and safety concerns, children are not allowed in the ultrasound rooms during exams. Our front office staff cannot provide observation of children in our lobby area while testing is being conducted. An adult accompanying a patient to their appointment will only be allowed in the ultrasound room at the discretion of the ultrasound technician under special circumstances. We apologize for any inconvenience.     Please report to Radiology at the [facility] Main Entrance 30 minutes early for your test.  [address] [place], [place] [address]  How to Prepare for Your Cardiac PET Stress Test:  Nothing to eat or drink, except water, 3 hours prior to arrival time.  NO caffeine/decaffeinated  products, or chocolate 12 hours prior to arrival. (Please note decaffeinated beverages (teas/coffees) still contain caffeine).  If you have caffeine within 12 hours prior, the test will need to be rescheduled.  Medication instructions: Do not take erectile dysfunction medications for 72 hours prior to test (sildenafil, tadalafil) Do not take nitrates (isosorbide mononitrate, Ranexa) the day before or day of test Do not take tamsulosin the day before or morning of test Hold theophylline containing medications for 12 hours. Hold Dipyridamole 48 hours prior to the test.  Diabetic Preparation: If able to eat breakfast prior to 3 hour fasting, you may take all medications, including your insulin. Do not worry if you miss your breakfast dose of insulin - start at your next meal. If you do not eat prior to 3 hour fast-Hold all diabetes (oral and insulin) medications. Patients who wear a continuous glucose monitor MUST remove the device prior to scanning.  You may take your remaining medications with water.  NO perfume, cologne or lotion on chest or abdomen area. FEMALES - Please avoid wearing dresses to this appointment.  Total time is 1 to 2 hours; you may want to bring reading material for the waiting time.  IF YOU THINK YOU MAY BE PREGNANT, OR ARE NURSING PLEASE INFORM THE TECHNOLOGIST.  In preparation for your appointment, medication and supplies will be purchased.  Appointment availability is limited, so if you need to cancel or reschedule, please call the Radiology Department Scheduler at [phone] 24 hours in advance to avoid a cancellation fee of $100.00  What to Expect When you Arrive:  Once you  arrive and check in for your appointment, you will be taken to a preparation room within the Radiology Department.  A technologist or Nurse will obtain your medical history, verify that you are correctly prepped for the exam, and explain the procedure.  Afterwards, an IV will be started in  your arm and electrodes will be placed on your skin for EKG monitoring during the stress portion of the exam. Then you will be escorted to the PET/CT scanner.  There, staff will get you positioned on the scanner and obtain a blood pressure and EKG.  During the exam, you will continue to be connected to the EKG and blood pressure machines.  A small, safe amount of a radioactive tracer will be injected in your IV to obtain a series of pictures of your heart along with an injection of a stress agent.    After your Exam:  It is recommended that you eat a meal and drink a caffeinated beverage to counter act any effects of the stress agent.  Drink plenty of fluids for the remainder of the day and urinate frequently for the first couple of hours after the exam.  Your doctor will inform you of your test results within 7-10 business days.  For more information and frequently asked questions, please visit our website: [email or web address]  For questions about your test or how to prepare for your test, please call: Cardiac Imaging Nurse Navigators Office: [phone]  Follow-Up: At [facility], you and your health needs are our priority.  As part of our continuing mission to provide you with exceptional heart care, we have created designated Provider Care Teams.  These Care Teams include your primary Cardiologist (physician) and Advanced Practice Providers (APPs -  Physician Assistants and Nurse Practitioners) who all work together to provide you with the care you need, when you need it.  We recommend signing up for the patient portal called MyChart.  Sign up information is provided on this After Visit Summary.  MyChart is used to connect with patients for Virtual Visits (Telemedicine).  Patients are able to view lab/test results, encounter notes, upcoming appointments, etc.  Non-urgent messages can be sent to your provider as well.   To learn more about what you can do with MyChart,  go to [email or web address].    Your next appointment:   April or May  Provider:   Lynwood Schilling, MD

## 2023-06-14 ENCOUNTER — Ambulatory Visit (HOSPITAL_COMMUNITY): Payer: Medicare HMO | Attending: Adult Health

## 2023-06-14 DIAGNOSIS — R079 Chest pain, unspecified: Secondary | ICD-10-CM

## 2023-06-14 DIAGNOSIS — I10 Essential (primary) hypertension: Secondary | ICD-10-CM | POA: Diagnosis not present

## 2023-06-14 LAB — ECHOCARDIOGRAM COMPLETE
AR max vel: 3.08 cm2
AV Area VTI: 3.23 cm2
AV Area mean vel: 2.93 cm2
AV Mean grad: 7 mm[Hg]
AV Peak grad: 12.8 mm[Hg]
Ao pk vel: 1.79 m/s
Area-P 1/2: 2.91 cm2
S' Lateral: 2.6 cm

## 2023-06-15 ENCOUNTER — Encounter: Payer: Self-pay | Admitting: Cardiology

## 2023-06-18 LAB — LIPID PANEL
Chol/HDL Ratio: 3 {ratio} (ref 0.0–5.0)
Cholesterol, Total: 115 mg/dL (ref 100–199)
HDL: 38 mg/dL — ABNORMAL LOW (ref >39)
LDL Chol Calc (NIH): 31 mg/dL (ref 0–99)
Triglycerides: 309 mg/dL — ABNORMAL HIGH (ref 0–149)
VLDL Cholesterol Cal: 46 mg/dL — ABNORMAL HIGH (ref 5–40)

## 2023-06-18 LAB — COMPREHENSIVE METABOLIC PANEL
ALT: 15 [IU]/L (ref 0–44)
AST: 17 [IU]/L (ref 0–40)
Albumin: 4.1 g/dL (ref 3.8–4.8)
Alkaline Phosphatase: 77 [IU]/L (ref 44–121)
BUN/Creatinine Ratio: 16 (ref 10–24)
BUN: 17 mg/dL (ref 8–27)
Bilirubin Total: 0.4 mg/dL (ref 0.0–1.2)
CO2: 24 mmol/L (ref 20–29)
Calcium: 9 mg/dL (ref 8.6–10.2)
Chloride: 100 mmol/L (ref 96–106)
Creatinine, Ser: 1.06 mg/dL (ref 0.76–1.27)
Globulin, Total: 2.5 g/dL (ref 1.5–4.5)
Glucose: 119 mg/dL — ABNORMAL HIGH (ref 70–99)
Potassium: 3.6 mmol/L (ref 3.5–5.2)
Sodium: 139 mmol/L (ref 134–144)
Total Protein: 6.6 g/dL (ref 6.0–8.5)
eGFR: 75 mL/min/{1.73_m2} (ref >59)

## 2023-06-18 LAB — VITAMIN D 1,25 DIHYDROXY
Vitamin D 1, 25 (OH)2 Total: 43 pg/mL
Vitamin D2 1, 25 (OH)2: <10 pg/mL
Vitamin D3 1, 25 (OH)2: 42 pg/mL

## 2023-06-18 LAB — TSH: TSH: 2.5 u[IU]/mL (ref 0.450–4.500)

## 2023-06-20 ENCOUNTER — Telehealth: Payer: Self-pay

## 2023-06-20 NOTE — Telephone Encounter (Addendum)
Results viewed by patient via Mychart.----- Message from Joni Reining sent at 06/16/2023  7:46 AM EST ----- I have reviewed echo report. His EF is very strong, but left side of heart is mildly should increase his fluids to prevent dehydration. No significant valvular disease.   KL

## 2023-06-20 NOTE — Telephone Encounter (Addendum)
Results viewd by patient via Mychart.----- Message from Joni Reining sent at 06/19/2023  1:21 PM EST ----- Vitamin D Level was normal  Good report.

## 2023-06-28 ENCOUNTER — Other Ambulatory Visit (HOSPITAL_COMMUNITY): Payer: Medicare HMO

## 2023-06-28 ENCOUNTER — Other Ambulatory Visit: Payer: Self-pay | Admitting: Cardiovascular Disease

## 2023-06-29 ENCOUNTER — Other Ambulatory Visit (HOSPITAL_COMMUNITY): Payer: Medicare HMO

## 2023-07-08 ENCOUNTER — Ambulatory Visit
Admission: RE | Admit: 2023-07-08 | Discharge: 2023-07-08 | Disposition: A | Discharge status: Home / Self Care | Payer: Medicare HMO | Source: Ambulatory Visit | Attending: Urology | Admitting: Urology

## 2023-07-08 DIAGNOSIS — R972 Elevated prostate specific antigen [PSA]: Secondary | ICD-10-CM

## 2023-07-08 MED ORDER — GADOPICLENOL 0.5 MMOL/ML IV SOLN
9.0000 mL | Freq: Once | INTRAVENOUS | Status: AC | PRN
  Administered 2023-07-08: 9 mL via INTRAVENOUS

## 2023-07-14 ENCOUNTER — Telehealth (INDEPENDENT_AMBULATORY_CARE_PROVIDER_SITE_OTHER): Payer: Self-pay | Admitting: Otolaryngology

## 2023-07-14 NOTE — Telephone Encounter (Signed)
 Confirmed appt and address with patient for 07/15/2023.

## 2023-07-15 ENCOUNTER — Ambulatory Visit (INDEPENDENT_AMBULATORY_CARE_PROVIDER_SITE_OTHER): Payer: Medicare HMO | Admitting: Otolaryngology

## 2023-07-15 ENCOUNTER — Telehealth: Payer: Self-pay | Admitting: Cardiovascular Disease

## 2023-07-15 ENCOUNTER — Encounter (INDEPENDENT_AMBULATORY_CARE_PROVIDER_SITE_OTHER): Payer: Self-pay

## 2023-07-15 VITALS — BP 146/85 | HR 69 | Ht 71.5 in | Wt 225.0 lb

## 2023-07-15 DIAGNOSIS — I700 Atherosclerosis of aorta: Secondary | ICD-10-CM

## 2023-07-15 DIAGNOSIS — H6123 Impacted cerumen, bilateral: Secondary | ICD-10-CM | POA: Diagnosis not present

## 2023-07-15 DIAGNOSIS — H9042 Sensorineural hearing loss, unilateral, left ear, with unrestricted hearing on the contralateral side: Secondary | ICD-10-CM

## 2023-07-15 DIAGNOSIS — H9312 Tinnitus, left ear: Secondary | ICD-10-CM

## 2023-07-15 DIAGNOSIS — I2583 Coronary atherosclerosis due to lipid rich plaque: Secondary | ICD-10-CM

## 2023-07-15 DIAGNOSIS — I679 Cerebrovascular disease, unspecified: Secondary | ICD-10-CM

## 2023-07-15 DIAGNOSIS — I2510 Atherosclerotic heart disease of native coronary artery without angina pectoris: Secondary | ICD-10-CM

## 2023-07-15 DIAGNOSIS — H9192 Unspecified hearing loss, left ear: Secondary | ICD-10-CM

## 2023-07-15 DIAGNOSIS — R0602 Shortness of breath: Secondary | ICD-10-CM

## 2023-07-15 DIAGNOSIS — R42 Dizziness and giddiness: Secondary | ICD-10-CM | POA: Diagnosis not present

## 2023-07-15 DIAGNOSIS — E785 Hyperlipidemia, unspecified: Secondary | ICD-10-CM

## 2023-07-15 DIAGNOSIS — I25118 Atherosclerotic heart disease of native coronary artery with other forms of angina pectoris: Secondary | ICD-10-CM

## 2023-07-15 DIAGNOSIS — G4730 Sleep apnea, unspecified: Secondary | ICD-10-CM

## 2023-07-15 DIAGNOSIS — I10 Essential (primary) hypertension: Secondary | ICD-10-CM

## 2023-07-15 NOTE — Addendum Note (Signed)
 Addended by: Brunetta Genera on: 07/15/2023 03:35 PM   Modules accepted: Orders

## 2023-07-15 NOTE — Telephone Encounter (Signed)
 Patient calling about his cpap equipment as well as a supplier in [employer]. Please advise

## 2023-07-15 NOTE — Telephone Encounter (Signed)
 Notified patent that he is overdue for Sleep Compliance. Order for supplies sent to new DME, [employer]. Message sent to [facility] to get patient an office visit.

## 2023-07-16 ENCOUNTER — Other Ambulatory Visit: Payer: Self-pay | Admitting: Cardiology

## 2023-07-16 DIAGNOSIS — H9312 Tinnitus, left ear: Secondary | ICD-10-CM | POA: Insufficient documentation

## 2023-07-16 NOTE — Progress Notes (Signed)
 Patient ID: Jesus Juarez, male   DOB: 07/14/1950, 73 y.o.   MRN: [id number]  Follow-up: Recurrent cerumen impaction, recurrent dizziness, Mnire's disease, left ear hearing loss  HPI: The patient is a 73 year old male who returns today for his follow-up evaluation.  The patient has a history of left ear hearing loss, left ear Mnire's disease, and recurrent cerumen impaction.  He was treated with a 1500 mg low-salt diet.  The patient returns today complaining of recurrent left ear tinnitus.  He denies any recent change in his hearing.  He has not noted any spinning vertigo.  He has occasional off-balance sensation.  Exam: General: Communicates without difficulty, well nourished, no acute distress. Head: Normocephalic, no evidence injury, no tenderness, facial buttresses intact without stepoff. Face/sinus: No tenderness to palpation and percussion. Facial movement is normal and symmetric. Eyes: PERRL, EOMI. No scleral icterus, conjunctivae clear. Neuro: CN II exam reveals vision grossly intact.  No nystagmus at any point of gaze. EAC: Bilateral cerumen impaction.  Under the operating microscope, the cerumen is carefully removed with a combination of cerumen currette, alligator forceps, and suction catheters.  After the cerumen is removed, the TMs are noted to be normal. Nose: External evaluation reveals normal support and skin without lesions.  Dorsum is intact.  Anterior rhinoscopy reveals pink mucosa over anterior aspect of inferior turbinates and intact septum.  No purulence noted. Oral:  Oral cavity and oropharynx are intact, symmetric, without erythema or edema.  Mucosa is moist without lesions. Neck: Full range of motion without pain.  There is no significant lymphadenopathy.  No masses palpable.  Thyroid bed within normal limits to palpation.  Parotid glands and submandibular glands equal bilaterally without mass.  Trachea is midline. Neuro:  CN 2-12 grossly intact. Gait wide-based. Vestibular: No  nystagmus at any point of gaze. Vestibular: There is no nystagmus with pneumatic pressure on either tympanic membrane or Valsalva. The cerebellar examination is unremarkable.   Procedure: Bilateral cerumen disimpaction Anesthesia: None Description: Under the operating microscope, the cerumen is carefully removed with a combination of cerumen currette, alligator forceps, and suction catheters.  After the cerumen is removed, the TMs are noted to be normal.  No mass, erythema, or lesions. The patient tolerated the procedure well.   Assessment: 1.  Bilateral recurrent cerumen impaction.  After the disimpaction procedure, both tympanic membranes and middle ear spaces are noted to be normal. 2.  Subjectively stable left ear hearing loss and left ear tinnitus, likely secondary to his Mnire's disease. 3.  His dizziness is currently under controlled with the low-salt diet.  Plan: 1.  Otomicroscopy with bilateral cerumen disimpaction. 2.  The physical exam findings are reviewed with the patient. 3.  Continue with his 1500 mg low-salt diet. 4.  The patient will return for reevaluation in 3 to 4 months.

## 2023-08-02 ENCOUNTER — Other Ambulatory Visit (HOSPITAL_COMMUNITY): Payer: Medicare HMO

## 2023-08-10 ENCOUNTER — Telehealth: Payer: Self-pay | Admitting: Cardiovascular Disease

## 2023-08-10 NOTE — Telephone Encounter (Signed)
 Patient wants a call back to get a supplier for his sleep equipment closer than [place].

## 2023-08-11 ENCOUNTER — Encounter (HOSPITAL_COMMUNITY): Payer: Self-pay

## 2023-08-16 ENCOUNTER — Ambulatory Visit (HOSPITAL_COMMUNITY)
Admission: RE | Admit: 2023-08-16 | Discharge: 2023-08-16 | Disposition: A | Discharge status: Home / Self Care | Payer: Medicare HMO | Source: Ambulatory Visit | Attending: Adult Health | Admitting: Adult Health

## 2023-08-16 DIAGNOSIS — I10 Essential (primary) hypertension: Secondary | ICD-10-CM | POA: Diagnosis not present

## 2023-08-16 DIAGNOSIS — R079 Chest pain, unspecified: Secondary | ICD-10-CM | POA: Insufficient documentation

## 2023-08-16 LAB — NM PET CT CARDIAC PERFUSION MULTI W/ABSOLUTE BLOODFLOW
LV dias vol: 89 mL (ref 62–150)
LV sys vol: 25 mL
MBFR: 1.85
Nuc Rest EF: 72 %
Nuc Stress EF: 67 %
Rest MBF: 1.02 ml/g/min
Rest Nuclear Isotope Dose: 26.6 mCi
ST Depression (mm): 0 mm
Stress MBF: 1.89 ml/g/min
Stress Nuclear Isotope Dose: 26.5 mCi

## 2023-08-16 MED ORDER — RUBIDIUM RB82 GENERATOR (RUBYFILL)
26.5000 | PACK | Freq: Once | INTRAVENOUS | Status: AC
  Administered 2023-08-16: 26.5 via INTRAVENOUS

## 2023-08-16 MED ORDER — RUBIDIUM RB82 GENERATOR (RUBYFILL)
26.6000 | PACK | Freq: Once | INTRAVENOUS | Status: AC
  Administered 2023-08-16: 26.6 via INTRAVENOUS

## 2023-08-16 MED ORDER — REGADENOSON 0.4 MG/5ML IV SOLN
0.4000 mg | Freq: Once | INTRAVENOUS | Status: AC
  Administered 2023-08-16: 0.4 mg via INTRAVENOUS

## 2023-08-16 MED ORDER — REGADENOSON 0.4 MG/5ML IV SOLN
INTRAVENOUS | Status: AC
  Filled 2023-08-16: qty 5

## 2023-08-31 ENCOUNTER — Other Ambulatory Visit: Payer: Self-pay | Admitting: Cardiology

## 2023-08-31 NOTE — Progress Notes (Addendum)
 Cardiology Office Note:   Date:  09/01/2023  ID:  Jesus Juarez, DOB January 21, 1951, MRN [id number] PCP: Jesus Bradley, MD  [facility] Providers Cardiologist:  Jesus Grates, MD {  History of Present Illness:   Jesus Juarez is a 73 y.o. male  who presents for evaluation of hypertension.  He has a history of a normal cath 20 yrs ago. His last nuclear was in 2017 with low risk study. He had palpitations in 10/2017 and an event monitor with SR/ST. In ER in Feb 2020 he had hand numbness other symptoms suggestive of a possible TIA.  CT head was normal and MRI of brian without acute abnormality.     He has seen a neurologist.  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   He had an angiogram.    The L MCA had complete occlusion.   He had a 75% stenosis of the R MCA prox M1 segment as well.   He had symptoms of unstable angina and PCI of an RCA in Nov 2022.   He is being treated for sleep apnea.  He had dizziness at the last visit and he had brief runs of SVT without a cause fore the dizziness.  He had increased dyspnea and had a PET without any evidence of ischemia.  He had a echocardiogram in January that demonstrated no significant findings.  He does have a slight intracavitary gradient with hyperdynamic LV function and mild LVH.  There were no significant valvular abnormalities.  He had normal pulmonary artery pressure.   Since I last saw him he thinks his breathing is a little bit better.  He still getting winded going up inclines.  He does not have any chest pressure, neck or arm discomfort.  He does not have any shortness of breath at rest and denies any PND or orthopnea.  He has had no new palpitations, presyncope or syncope.  He does use CPAP.  ROS: As stated in the HPI and negative for all other systems.   Studies Reviewed:    EKG:   EKG Interpretation Date/Time:  Thursday Sep 01 2023 09:15:45 EDT Ventricular Rate:  68 PR  Interval:  158 QRS Duration:  124 QT Interval:  424 QTC Calculation: 450 R Axis:   37  Text Interpretation: Normal sinus rhythm Right bundle branch block When compared with ECG of 27-Mar-2021 11:36, No significant change was found Confirmed by Jesus Juarez ([id number]) on 09/01/2023 9:40:08 AM   Risk Assessment/Calculations:         Physical Exam:   VS:  BP (!) 146/80   Pulse 68   Ht 5\' 11"  (1.803 m)   Wt 237 lb (107.5 kg)   SpO2 99%   BMI 33.05 kg/m    Wt Readings from Last 3 Encounters:  09/01/23 237 lb (107.5 kg)  07/15/23 225 lb (102.1 kg)  06/07/23 232 lb (105.2 kg)     GEN: Well nourished, well developed in no acute distress NECK: No JVD; No carotid bruits CARDIAC: RRR, 2 out of 6 apical systolic murmur radiating slightly at the aortic outflow tract, no diastolic murmurs, rubs, gallops RESPIRATORY:  Clear to auscultation without rales, wheezing or rhonchi  ABDOMEN: Soft, non-tender, non-distended EXTREMITIES:  No edema; No deformity   ASSESSMENT AND PLAN:   CAD:    He had a negative PET scan.   I do not suspect an ischemic etiology to any of his dyspnea.  He will continue with risk reduction.   DIZZINESS:    There is no clear etiology.  He did have some brief runs of SVT on the monitor in the past but no new symptoms.  No change in therapy.  HTN: The blood pressure is mildly elevated and I am going to add amlodipine  2.5 mg to his existing regimen.  NEUROVASCULAR DISEASE:   He has had TIAs and remains on DAPT.   DYSLIPIDEMIA:    His goal LDL was 31.  No change in therapy.    SLEEP APNEA:   He wears CPAP.  He is using the CPAP and benefiting form this.    LVH: He has had some mild dynamic outflow obstruction but no clear diagnosis of hypertrophic cardiomyopathy.  He has been instructed about hydrating in the past.  He does have a little bit of a gradient of murmur but I am not clear that this is causing any of his symptoms.  He has been improving.  I have asked an  exerciser to the center and see how his breathing is.  For now no change in therapy or further imaging.  If he has increasing dyspnea in the future we might consider MRI.    Follow up with APP in six months.   Signed, Jesus Grates, MD

## 2023-09-01 ENCOUNTER — Encounter: Payer: Self-pay | Admitting: Cardiology

## 2023-09-01 ENCOUNTER — Ambulatory Visit: Payer: Medicare HMO | Attending: Cardiology | Admitting: Cardiology

## 2023-09-01 VITALS — BP 146/80 | HR 68 | Ht 71.0 in | Wt 237.0 lb

## 2023-09-01 DIAGNOSIS — I10 Essential (primary) hypertension: Secondary | ICD-10-CM

## 2023-09-01 DIAGNOSIS — G4730 Sleep apnea, unspecified: Secondary | ICD-10-CM

## 2023-09-01 DIAGNOSIS — I2510 Atherosclerotic heart disease of native coronary artery without angina pectoris: Secondary | ICD-10-CM | POA: Diagnosis not present

## 2023-09-01 DIAGNOSIS — E785 Hyperlipidemia, unspecified: Secondary | ICD-10-CM | POA: Diagnosis not present

## 2023-09-01 MED ORDER — AMLODIPINE BESYLATE 2.5 MG PO TABS: 2.5000 mg | ORAL_TABLET | Freq: Every day | ORAL | 3 refills | Status: DC

## 2023-09-01 NOTE — Patient Instructions (Signed)
 Medication Instructions:  START 2.5 MG AMLODIPINE  BY MOUTH DAILY.    *If you need a refill on your cardiac medications before your next appointment, please call your pharmacy*   Lab Work: NONE    If you have labs (blood work) drawn today and your tests are completely normal, you will receive your results only by: MyChart Message (if you have MyChart) OR A paper copy in the mail If you have any lab test that is abnormal or we need to change your treatment, we will call you to review the results.   Testing/Procedures: NONE    Follow-Up: At [facility], you and your health needs are our priority.  As part of our continuing mission to provide you with exceptional heart care, we have created designated Provider Care Teams.  These Care Teams include your primary Cardiologist (physician) and Advanced Practice Providers (APPs -  Physician Assistants and Nurse Practitioners) who all work together to provide you with the care you need, when you need it.  We recommend signing up for the patient portal called "MyChart".  Sign up information is provided on this After Visit Summary.  MyChart is used to connect with patients for Virtual Visits (Telemedicine).  Patients are able to view lab/test results, encounter notes, upcoming appointments, etc.  Non-urgent messages can be sent to your provider as well.   To learn more about what you can do with MyChart, go to [email or web address].    Your next appointment:   6 month(s)  The format for your next appointment:   In Person  Provider:   Friddie Jetty, NP         Other Instructions

## 2023-09-06 DIAGNOSIS — H40033 Anatomical narrow angle, bilateral: Secondary | ICD-10-CM | POA: Diagnosis not present

## 2023-09-06 DIAGNOSIS — H2513 Age-related nuclear cataract, bilateral: Secondary | ICD-10-CM | POA: Diagnosis not present

## 2023-09-06 DIAGNOSIS — H02834 Dermatochalasis of left upper eyelid: Secondary | ICD-10-CM | POA: Diagnosis not present

## 2023-09-06 DIAGNOSIS — H02831 Dermatochalasis of right upper eyelid: Secondary | ICD-10-CM | POA: Diagnosis not present

## 2023-09-08 DIAGNOSIS — D225 Melanocytic nevi of trunk: Secondary | ICD-10-CM | POA: Diagnosis not present

## 2023-09-08 DIAGNOSIS — L814 Other melanin hyperpigmentation: Secondary | ICD-10-CM | POA: Diagnosis not present

## 2023-09-08 DIAGNOSIS — B079 Viral wart, unspecified: Secondary | ICD-10-CM | POA: Diagnosis not present

## 2023-09-08 DIAGNOSIS — L570 Actinic keratosis: Secondary | ICD-10-CM | POA: Diagnosis not present

## 2023-09-08 DIAGNOSIS — D492 Neoplasm of unspecified behavior of bone, soft tissue, and skin: Secondary | ICD-10-CM | POA: Diagnosis not present

## 2023-09-08 DIAGNOSIS — Z1283 Encounter for screening for malignant neoplasm of skin: Secondary | ICD-10-CM | POA: Diagnosis not present

## 2023-09-08 DIAGNOSIS — L821 Other seborrheic keratosis: Secondary | ICD-10-CM | POA: Diagnosis not present

## 2023-09-21 ENCOUNTER — Encounter: Payer: Self-pay | Admitting: Cardiology

## 2023-09-21 DIAGNOSIS — I2510 Atherosclerotic heart disease of native coronary artery without angina pectoris: Secondary | ICD-10-CM

## 2023-09-21 DIAGNOSIS — I10 Essential (primary) hypertension: Secondary | ICD-10-CM

## 2023-09-21 DIAGNOSIS — E785 Hyperlipidemia, unspecified: Secondary | ICD-10-CM

## 2023-10-07 ENCOUNTER — Telehealth: Payer: Self-pay

## 2023-10-07 NOTE — Telephone Encounter (Signed)
 Left voice message to call back 6/6

## 2023-10-12 ENCOUNTER — Encounter: Payer: Self-pay | Admitting: Cardiovascular Disease

## 2023-10-12 ENCOUNTER — Ambulatory Visit: Attending: Cardiovascular Disease | Admitting: Cardiovascular Disease

## 2023-10-12 DIAGNOSIS — E785 Hyperlipidemia, unspecified: Secondary | ICD-10-CM | POA: Diagnosis not present

## 2023-10-12 DIAGNOSIS — G4733 Obstructive sleep apnea (adult) (pediatric): Secondary | ICD-10-CM

## 2023-10-12 DIAGNOSIS — I2510 Atherosclerotic heart disease of native coronary artery without angina pectoris: Secondary | ICD-10-CM | POA: Diagnosis not present

## 2023-10-12 DIAGNOSIS — E7800 Pure hypercholesterolemia, unspecified: Secondary | ICD-10-CM

## 2023-10-12 DIAGNOSIS — I10 Essential (primary) hypertension: Secondary | ICD-10-CM | POA: Diagnosis not present

## 2023-10-12 NOTE — Progress Notes (Signed)
 Cardiology Office Note    Date:  10/17/2023   ID:  Jesus Juarez, DOB [birth year], MRN [id number]  PCP:  Perley Bradley, MD  Cardiologist:  Magnus Schuller, MD (sleep); Dr. Lavonne Prairie  27 month F/U sleep evaluation  History of Present Illness:  Jesus Juarez is a 73 y.o. male who is followed by Dr. Lavonne Prairie for his cardiology care.  He has a long history of sleep apnea for greater than 25 years.  After moving to [facility], he established care with me in follow-up of his sleep apnea in October 2020.  I last saw him on July 19, 2021.  He presents for a 49-month follow-up sleep evaluation.  Jesus Juarez has a history of hypertension, and remote TIA with documented branch vessel occlusion of the left middle cerebral artery.  He has had issues with intermittent leg swelling.    Jesus Juarez was originally diagnosed with sleep apnea over 25 years ago in the early 1990s.   He has utilized several CPAP machines over the years.  He has not seen a sleep physician for a long time.  Recently he had been [place] and his DME company was [employer].  It appears that he underwent a reevaluation with Novasom sleep study in August 2017 prior to receiving a new machine and his AHI was 51.9/h.  He had received a ResMed AirSense machine but over the past half a year he had reverted back to using his S9 Elite CPAP machine which is not wireless.  With the move he packed up his new ResMed and apparently had not unpacked it yet to start using his new machine.  There is concern that since he had not been evaluated in several years that his settings may not be optimal and may need adjustment in verification.  For this reason he was scheduled for a new sleep evaluation with me.  Presently, he admits to 100% CPAP use.  We were able to obtain a download of his S9 Elite non-wireless CPAP machine from his card in the device.  Usage is 100% over 90 days and he is averaging 8 hours and 6 minutes of CPAP use.  At a 10 cm set  pressure, AHI remains mildly increased at 6.3 with an apnea index of 3.6 and hypopnea index of 2.7.  He is unaware of breakthrough snoring.  He denies bruxism, restless legs, hypnagogic hallucinations, or cataplectic events.  Prior initiating CPAP therapy he would have frequent awakenings, and awakening gasping for breath.  He also experienced significant daytime sleepiness.  At his initial evaluation I calculated an Epworth Sleepiness Scale score which endorsed at 10 and as shown below:  Epworth Sleepiness Scale: Situation   Chance of Dozing/Sleeping (0 = never , 1 = slight chance , 2 = moderate chance , 3 = high chance )   sitting and reading 1   watching TV 2   sitting inactive in a public place 1   being a passenger in a motor vehicle for an hour or more 2   lying down in the afternoon 2   sitting and talking to someone 0   sitting quietly after lunch (no alcohol) 2   while stopped for a few minutes in traffic as the driver 0   Total Score  10   During his initial evaluation with me he had brought his machine with him and a download from his card revealed 100% compliance with average use at 8 hours and 6  minutes.  He was set at a 10 cm pressure and AHI was 6.3.  At that time I recommended increasing CPAP to 12 cm water pressure.  Since that time we were able to arrange for his DME company at [facility] so that his machine could be linked to our office.  I saw him in February 2022.  At that time he was continuing to use CPAP with excellent compliance.   He typically goes to bed at 11 PM and wakes up at 7 AM.  He has nocturia approximately 1 time per night.  A download was obtained from January 17 through June 17, 2020.  Usage is 100% with average use at 7 hours and 56 minutes.  His air sense 10 AutoSet unit is set at a minimum pressure of 12 with maximum up to 18.  There was some days of increased leak.  AHI was 4.0.  Upon further questioning, the patient leaves his machine on when he  goes to the bathroom.  This may be creating an artifactual increase in his 95th percentile average pressure which is 15.8 with maximum average pressure at 17.1/h.  A new Epworth Sleepiness Scale score was calculated in the office today and this endorsed at 13 suggestive of residual daytime sleepiness.  He continued to be on amlodipine  5 mg and chlorthalidone  25 mg for hypertension.  He is on pravastatin  for hyperlipidemia.  He is on Plavix  daily with remote symptoms of possible TIA and branch vessel occlusion off the left middle cerebral artery.   I last saw him on July 19, 2021.  He was sleeping well and his sleep was restorative.  Typically he goes to bed between 10 and 11 PM at night and wakes up around 7 AM.  He states his blood pressure at home  is in the 130s over 70s.  A download was obtained from February 12 through July 13, 2021.  Compliance is 100% regarding usage with an average use at 8 hours and 21 minutes.  AHI is excellent at 2.8 and his AirSense 10 AutoSet unit is set at a pressure range of 10 to 16 cm with 95th percentile pressure 13.1.  An Epworth sleepiness scale score was calculated in the office today and this endorsed at 11 suggestive of mild residual daytime sleepiness.  In November 2022 he underwent cardiac catheterization after coronary CTA suggested functionally significant RCA stenosis.  He underwent successful DES stenting of a 90% proximal RCA on March 27, 2021 and was found to have mild concomitant nonobstructive CAD.  He saw Alice Innocent in follow-up of his catheterization and intervention.  He continues to be followed by Dr. Lavonne Prairie for his primary cardiology care.  Since his prior evaluation with me, he had undergone a coronary CTA with FFR analysis suggesting functionally significant RCA stenosis and he underwent catheterization and stenting of his RCA by Dr. Arlester Ladd in November 2022.  Presently, Jesus Juarez feels well.  He would like to switch to a new DME company and will be  switched from [facility] pharmacy in [place] to [facility].  He continues to use CPAP.  A download was obtained from 812 through October 11, 2023.  Compliance is excellent with 100% use and average use at 8 hours and 5 minutes.  He has a ResMed air sense 10 AutoSet unit which is set at a pressure range of 12 to 18 cm of water.  AHI is 3.1.  His 95th percentile pressure is 15.3 with maximal average pressure 16.9.  He is unaware of any breakthrough snoring.  He denies frequent nocturia.  Sleep is refreshing.  Typically he goes to bed around 10 PM and wakes up between 5 and 6 AM.  He presents for follow-up evaluation.  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 s/p DES to RCA 03/2021, mild residual disease for med rx.   Cerebrovascular disease    History of cardiac catheterization    Normal coronaries - approximately 20 years ago   HLD (hyperlipidemia)    Hypertension    Incomplete right bundle branch block (RBBB)    Obstructive sleep apnea    CPAP   Vitamin D  deficiency     Past Surgical History:  Procedure Laterality Date   CORONARY STENT INTERVENTION N/A 03/27/2021   Procedure: CORONARY STENT INTERVENTION;  Surgeon: Arnoldo Lapping, MD;  Location: [facility] INVASIVE CV LAB;  Service: Cardiovascular;  Laterality: N/A;   IR ANGIO INTRA EXTRACRAN SEL COM CAROTID INNOMINATE BILAT MOD SED  10/03/2018   IR ANGIO VERTEBRAL SEL SUBCLAVIAN INNOMINATE UNI L MOD SED  10/03/2018   IR ANGIO VERTEBRAL SEL VERTEBRAL UNI R MOD SED  10/03/2018   IR US  GUIDE VASC ACCESS RIGHT  10/03/2018   LEFT HEART CATH AND CORONARY ANGIOGRAPHY N/A 03/27/2021   Procedure: LEFT HEART CATH AND CORONARY ANGIOGRAPHY;  Surgeon: Arnoldo Lapping, MD;  Location: [facility] INVASIVE CV LAB;  Service: Cardiovascular;  Laterality: N/A;    Current Medications: Outpatient Medications Prior to Visit  Medication Sig Dispense Refill   amLODipine  (NORVASC ) 2.5 MG tablet Take 1 tablet (2.5 mg total) by  mouth daily. (Patient taking differently: Take 7.5 mg by mouth daily.) 180 tablet 3   B Complex Vitamins (VITAMIN-B COMPLEX PO) Take 1 tablet by mouth daily.      chlorthalidone  (HYGROTON ) 25 MG tablet Take 1 tablet (25 mg total) by mouth daily. 90 tablet 3   cholecalciferol (VITAMIN D ) 1000 units tablet Take 1,000 Units by mouth 2 (two) times daily.     clopidogrel  (PLAVIX ) 75 MG tablet TAKE 1 TABLET BY MOUTH EVERY DAY 100 tablet 3   Evolocumab  (REPATHA  SURECLICK) 140 MG/ML SOAJ INJECT 140 MG INTO THE SKIN EVERY 14 (FOURTEEN) DAYS. 6 mL 3   Multiple Vitamin (MULTIVITAMIN) tablet Take 1 tablet by mouth daily.     vitamin C (ASCORBIC ACID) 500 MG tablet Take 500 mg by mouth daily.     Zinc Sulfate (ZINC 15 PO) Take by mouth.     nitroGLYCERIN  (NITROSTAT ) 0.4 MG SL tablet Place 1 tablet (0.4 mg total) under the tongue every 5 (five) minutes as needed for chest pain (up to 3 doses. If taking 3rd dose call 911). 25 tablet 3   No facility-administered medications prior to visit.     Allergies:   Crestor  [rosuvastatin  calcium ], Penicillins, Elemental sulfur, and Sulfa antibiotics   Social History   Socioeconomic History   Marital status: Married    Spouse name: Not on file   Number of children: 4   Years of education: 14   Highest education level: Associate degree: academic program  Occupational History   Occupation: retired  Tobacco Use   Smoking status: Former    Types: Cigarettes   Smokeless tobacco: Never   Tobacco comments:    tried as a teenager about a pack  Vaping Use   Vaping status: Never Used  Substance and Sexual Activity  Alcohol use: Yes    Alcohol/week: 0.0 standard drinks of alcohol    Comment: occasional beer   Drug use: No   Sexual activity: Not on file  Other Topics Concern   Not on file  Social History Narrative   Lives with wife in a one story home.  Has 4 children.     Retired from Conservation officer, historic buildings) in 2008.     Education: 2 year college  degree.    Left Handed   Social Drivers of Health   Financial Resource Strain: Not on file  Food Insecurity: Not on file  Transportation Needs: Not on file  Physical Activity: Not on file  Stress: Not on file  Social Connections: Not on file   He retired at age 77.  He has 4 children and 1 grandchild.  Family History:  The patient's family history includes Breast cancer in his mother; Dementia in his mother; Heart disease in his maternal grandmother; Hypertension in his mother; Kidney disease in his maternal grandmother; Supraventricular tachycardia in his mother.  His father died at age 75.  His mother is still living.  ROS General: Negative; No fevers, chills, or night sweats;  HEENT: Negative; No changes in vision or hearing, sinus congestion, difficulty swallowing Pulmonary: Negative; No cough, wheezing, shortness of breath, hemoptysis Cardiovascular: Negative; No chest pain, presyncope, syncope, palpitations GI: Negative; No nausea, vomiting, diarrhea, or abdominal pain GU: Negative; No dysuria, hematuria, or difficulty voiding Musculoskeletal: Negative; no myalgias, joint pain, or weakness Hematologic/Oncology: Negative; no easy bruising, bleeding Endocrine: Negative; no heat/cold intolerance; no diabetes Neuro: Negative; no changes in balance, headaches Skin: Negative; No rashes or skin lesions Psychiatric: Negative; No behavioral problems, depression Sleep: Positive for OSA on CPAP no awareness of breakthrough snoring.  Positive for residual daytime sleepiness with an Epworth Sleepiness Scale score endorsed today at 11  no bruxism, restless legs, hypnogognic hallucinations, no cataplexy Other comprehensive 14 point system review is negative.   PHYSICAL EXAM:   VS:  BP 125/64 (BP Location: Right Arm, Patient Position: Sitting, Cuff Size: Large)   Pulse 65   Resp 16   Ht 5' 11 (1.803 m)   Wt 234 lb 8 oz (106.4 kg)   SpO2 97%   BMI 32.71 kg/m     Repeat blood  pressure by me was 136/76  Wt Readings from Last 3 Encounters:  10/12/23 234 lb 8 oz (106.4 kg)  09/01/23 237 lb (107.5 kg)  07/15/23 225 lb (102.1 kg)    General: Alert, oriented, no distress.  Skin: normal turgor, no rashes, warm and dry HEENT: Normocephalic, atraumatic. Pupils equal round and reactive to light; sclera anicteric; extraocular muscles intact;  Nose without nasal septal hypertrophy Mouth/Parynx benign; Mallinpatti scale 3 Neck: No JVD, no carotid bruits; normal carotid upstroke Lungs: clear to ausculatation and percussion; no wheezing or rales Chest wall: without tenderness to palpitation Heart: PMI not displaced, RRR, s1 s2 normal, 1/6 systolic murmur, no diastolic murmur, no rubs, gallops, thrills, or heaves Abdomen: soft, nontender; no hepatosplenomehaly, BS+; abdominal aorta nontender and not dilated by palpation. Back: no CVA tenderness Pulses 2+ Musculoskeletal: full range of motion, normal strength, no joint deformities Extremities: no clubbing cyanosis or edema, Homan's sign negative  Neurologic: grossly nonfocal; Cranial nerves grossly wnl Psychologic: Normal mood and affect  Studies/Labs Reviewed:   EKG Interpretation Date/Time:  Wednesday October 12 2023 09:40:57 EDT Ventricular Rate:  65 PR Interval:  168 QRS Duration:  128 QT Interval:  438 QTC Calculation: 455  R Axis:   22  Text Interpretation: Normal sinus rhythm Right bundle branch block When compared with ECG of 01-Sep-2023 09:15, No significant change was found Confirmed by Magnus Schuller ([id number]) on 10/17/2023 9:21:24 AM    July 13, 2021 ECG (independently read by me): NSR at 67; IRBBB; QTc 454 msec  June 19, 2020 ECG (independently read by me): :  NSR at 63, no ectopy; QTc 431  February 01, 2019 ECG (independently read by me):Normal sinus rhythm at 60 bpm.  RV conduction delay.  Nonspecific ST changes.  Recent Labs:    Latest Ref Rng & Units 06/08/2023    8:06 AM 07/08/2022    8:13 AM  06/08/2021    9:52 AM  BMP  Glucose 70 - 99 mg/dL 604  540  981   BUN 8 - 27 mg/dL 17  18  20    Creatinine 0.76 - 1.27 mg/dL 1.91  4.78  2.95   BUN/Creat Ratio 10 - 24 16  17  19    Sodium 134 - 144 mmol/L 139  134  142   Potassium 3.5 - 5.2 mmol/L 3.6  3.7  4.2   Chloride 96 - 106 mmol/L 100  95  100   CO2 20 - 29 mmol/L 24  24  27    Calcium  8.6 - 10.2 mg/dL 9.0  9.2  9.8         Latest Ref Rng & Units 06/08/2023    8:06 AM 07/08/2022    8:13 AM 06/08/2021    9:52 AM  Hepatic Function  Total Protein 6.0 - 8.5 g/dL 6.6  6.7  7.0   Albumin 3.8 - 4.8 g/dL 4.1  4.2  4.5   AST 0 - 40 IU/L 17  19  17    ALT 0 - 44 IU/L 15  17  22    Alk Phosphatase 44 - 121 IU/L 77  83  83   Total Bilirubin 0.0 - 1.2 mg/dL 0.4  0.4  0.6        Latest Ref Rng & Units 03/25/2021   11:26 AM 10/03/2018    7:04 AM 06/12/2018    3:14 PM  CBC  WBC 3.4 - 10.8 x10E3/uL 8.7  6.3  8.6   Hemoglobin 13.0 - 17.7 g/dL 62.1  30.8  65.7   Hematocrit 37.5 - 51.0 % 46.9  41.7  46.2   Platelets 150 - 450 x10E3/uL 269  220  237    Lab Results  Component Value Date   MCV 87 03/25/2021   MCV 88.0 10/03/2018   MCV 88.0 06/12/2018   Lab Results  Component Value Date   TSH 2.500 06/08/2023   Lab Results  Component Value Date   HGBA1C 5.6 07/06/2018     BNP No results found for: BNP  ProBNP No results found for: PROBNP   Lipid Panel     Component Value Date/Time   CHOL 115 06/08/2023 0806   TRIG 309 (H) 06/08/2023 0806   HDL 38 (L) 06/08/2023 0806   CHOLHDL 3.0 06/08/2023 0806   CHOLHDL 4 07/06/2018 0729   VLDL 43.2 (H) 07/06/2018 0729   LDLCALC 31 06/08/2023 0806   LDLDIRECT 123.0 07/06/2018 0729   LABVLDL 46 (H) 06/08/2023 0806     RADIOLOGY: No results found.   Additional studies/ records that were reviewed today include:  I reviewed the records of Dr. Lavonne Prairie, imaging studies,Novasom sleep study from Jan 12, 2016 and obtained a download from his card in his old S9  Elite CPAP unit  A  new download was obtained from his ResMed air sense 10 CPAP auto unit from February 12 through July 13, 2021.  AHI 2.8 with 95 percentile pressure at 13.1 and maximum average pressure at 14.3  Cardiac catheterization/PCI data from March 27, 2021 was reviewed.  New download data from May 12 through October 11, 2023 was obtained and reviewed  ASSESSMENT:    1. OSA on CPAP   2. CAD in native artery: DES stent to RCA, March 27, 2021   3. Essential hypertension   4. Dyslipidemia    PLAN:  Mr. Dorin Stooksbury is a 73 year old gentleman who has a history of hypertension, documented aortic atherosclerosis as well as a remote TIA.  He was originally diagnosed with obstructive sleep apnea in the early 1990s and has been on CPAP therapy for greater than 30 years.  His sleep evaluation in August 2017 confirmed severe sleep apnea with an AHI of 51.9/h.  He received a new ResMed air sense 10 CPAP machine.  When I  saw him in February 2022 he was keeping his machine on when he would take his mask off to go to the bathroom which was contributing to his higher pressures.  His compliance continues to be excellent with 100% use and average sleep duration with CPAP at 8 hours and 21 minutes.  He is going to bed between 10 and 11 PM and wakes up around 7 AM.  On his download from February 12 through July 13, 2021 AHI was 2.8/h and his pressure ranges 11 to 16 cm with 95th percentile pressure of 13.1.  There is no mask leak on his current fullface mask.  Over the past several years he has continued to use CPAP with 100% compliance.  His most recent download from 812 through October 03, 2023 shows 100% use with average use of 8 hours and 5 minutes.  His pressure currently is set at 12 to 18 cm of water and his 95th percentile pressure is 15.3 with maximum average pressure at 16.9.  AHI was 3.1.  I will make slight transition to his pressure and we will start him at a CPAP pressure of 13 with continued range to 18.  I am also  increasing his ramp start pressure from 4 up to 6.  Per his request we will switch him from [facility] in [place] to [facility] for his DME company.  He continues to be stable from a cardiac standpoint and is status post PCI to his RCA in 2022.  He is followed by Dr. Lavonne Prairie for primary cardiology care.  He is aware of my imminent retirement.  I will transition him to the sleep care of Dr. Micael Adas with follow-up in 1 year or sooner as needed.   Medication Adjustments/Labs and Tests Ordered: Current medicines are reviewed at length with the patient today.  Concerns regarding medicines are outlined above.  Medication changes, Labs and Tests ordered today are listed in the Patient Instructions below. Patient Instructions  Medication Instructions:  NO CHANGES   Lab Work: NONE   Testing/Procedures: NONE  Follow-Up: At [facility], you and your health needs are our priority.  As part of our continuing mission to provide you with exceptional heart care, our providers are all part of one team.  This team includes your primary Cardiologist (physician) and Advanced Practice Providers or APPs (Physician Assistants and Nurse Practitioners) who all work together to provide you with the care you need, when you  need it.  Your next appointment:   3 month(s)  Provider:   Eilleen Grates, MD  Erla Haw, MD    Signed, Magnus Schuller, MD, [facility], ABSM Diplomate, American Board of Sleep Medicine   10/17/2023 9:30 AM    [facility] [address], [address], [place], [place]  [address] Phone: [phone]

## 2023-10-12 NOTE — Patient Instructions (Addendum)
 Medication Instructions:  NO CHANGES   Lab Work: NONE   Testing/Procedures: NONE  Follow-Up: At [facility], you and your health needs are our priority.  As part of our continuing mission to provide you with exceptional heart care, our providers are all part of one team.  This team includes your primary Cardiologist (physician) and Advanced Practice Providers or APPs (Physician Assistants and Nurse Practitioners) who all work together to provide you with the care you need, when you need it.  Your next appointment:   3 month(s)  Provider:   Eilleen Grates, MD

## 2023-10-17 ENCOUNTER — Encounter: Payer: Self-pay | Admitting: Cardiovascular Disease

## 2023-10-17 MED ORDER — AMLODIPINE BESYLATE 5 MG PO TABS: 5.0000 mg | ORAL_TABLET | Freq: Every day | ORAL | 3 refills | Status: DC

## 2023-10-17 NOTE — Telephone Encounter (Signed)
 Spoke with pt regarding Dr. Atlas Lea suggestion to reduce dosage of amlodipine  from 7.5 to 5 mg once daily. Pt agreeable. Pt to keep blood pressure log and take to appointment with PharmD. A referral was placed. Pt aware. Prescription updated in chart. Pt is interested in a referral to a nutritionist to help keep his blood pressure down. Pt was told his request would be sent to Dr. Lavonne Prairie for suggestions. Pt verbalized understanding. All questions if any were answered.

## 2023-10-17 NOTE — Addendum Note (Signed)
 Addended by: Burlene Montecalvo N on: 10/17/2023 04:50 PM   Modules accepted: Orders

## 2023-10-19 NOTE — Telephone Encounter (Signed)
     10/17/23  8:47 PM I don't have a particular nutritionist.  Please just put in a consult for heart healthy diet consult.  Schlanger, Lonnell Rob, RN to Eilleen Grates, MD     10/17/23  4:50 PM Pt asking for referral to nutritionist.  Ordered placed as requeseted

## 2023-10-19 NOTE — Addendum Note (Signed)
 Addended by: Thaddeus Filippo on: 10/19/2023 03:38 PM   Modules accepted: Orders

## 2023-10-20 NOTE — Telephone Encounter (Signed)
 Last read by [name] at 4:39PM on 10/20/2023.

## 2023-10-24 ENCOUNTER — Telehealth: Payer: Self-pay | Admitting: Cardiovascular Disease

## 2023-10-24 DIAGNOSIS — I700 Atherosclerosis of aorta: Secondary | ICD-10-CM

## 2023-10-24 DIAGNOSIS — I2510 Atherosclerotic heart disease of native coronary artery without angina pectoris: Secondary | ICD-10-CM

## 2023-10-24 DIAGNOSIS — E785 Hyperlipidemia, unspecified: Secondary | ICD-10-CM

## 2023-10-24 DIAGNOSIS — R0602 Shortness of breath: Secondary | ICD-10-CM

## 2023-10-24 DIAGNOSIS — I10 Essential (primary) hypertension: Secondary | ICD-10-CM

## 2023-10-24 DIAGNOSIS — G4733 Obstructive sleep apnea (adult) (pediatric): Secondary | ICD-10-CM

## 2023-10-24 NOTE — Telephone Encounter (Signed)
 Patient states Dr. Burnard was suppose to set him up with a new machine.  He wants to know why that hasn't gone through.  He has an appt tomorrow to [facility].  He said he spoke with them and they haven't received an order.

## 2023-10-25 DIAGNOSIS — G4733 Obstructive sleep apnea (adult) (pediatric): Secondary | ICD-10-CM | POA: Diagnosis not present

## 2023-10-29 ENCOUNTER — Encounter (HOSPITAL_COMMUNITY): Payer: Self-pay | Admitting: Interventional Radiology

## 2023-11-02 DIAGNOSIS — R400 Somnolence: Secondary | ICD-10-CM | POA: Diagnosis not present

## 2023-11-02 DIAGNOSIS — G4733 Obstructive sleep apnea (adult) (pediatric): Secondary | ICD-10-CM | POA: Diagnosis not present

## 2023-11-03 DIAGNOSIS — E669 Obesity, unspecified: Secondary | ICD-10-CM | POA: Diagnosis not present

## 2023-11-03 DIAGNOSIS — E785 Hyperlipidemia, unspecified: Secondary | ICD-10-CM | POA: Diagnosis not present

## 2023-11-03 DIAGNOSIS — I700 Atherosclerosis of aorta: Secondary | ICD-10-CM | POA: Diagnosis not present

## 2023-11-03 DIAGNOSIS — G4733 Obstructive sleep apnea (adult) (pediatric): Secondary | ICD-10-CM | POA: Diagnosis not present

## 2023-11-03 DIAGNOSIS — I2510 Atherosclerotic heart disease of native coronary artery without angina pectoris: Secondary | ICD-10-CM | POA: Diagnosis not present

## 2023-11-03 DIAGNOSIS — Z8249 Family history of ischemic heart disease and other diseases of the circulatory system: Secondary | ICD-10-CM | POA: Diagnosis not present

## 2023-11-03 DIAGNOSIS — Z880 Allergy status to penicillin: Secondary | ICD-10-CM | POA: Diagnosis not present

## 2023-11-03 DIAGNOSIS — M1990 Unspecified osteoarthritis, unspecified site: Secondary | ICD-10-CM | POA: Diagnosis not present

## 2023-11-03 DIAGNOSIS — N400 Enlarged prostate without lower urinary tract symptoms: Secondary | ICD-10-CM | POA: Diagnosis not present

## 2023-11-03 DIAGNOSIS — N182 Chronic kidney disease, stage 2 (mild): Secondary | ICD-10-CM | POA: Diagnosis not present

## 2023-11-03 DIAGNOSIS — Z008 Encounter for other general examination: Secondary | ICD-10-CM | POA: Diagnosis not present

## 2023-11-03 DIAGNOSIS — N529 Male erectile dysfunction, unspecified: Secondary | ICD-10-CM | POA: Diagnosis not present

## 2023-11-03 DIAGNOSIS — I771 Stricture of artery: Secondary | ICD-10-CM | POA: Diagnosis not present

## 2023-11-17 NOTE — Telephone Encounter (Signed)
 Pt requesting a c/b with a update

## 2023-11-18 ENCOUNTER — Encounter (INDEPENDENT_AMBULATORY_CARE_PROVIDER_SITE_OTHER): Payer: Self-pay | Admitting: Otolaryngology

## 2023-11-18 ENCOUNTER — Ambulatory Visit (INDEPENDENT_AMBULATORY_CARE_PROVIDER_SITE_OTHER): Admitting: Otolaryngology

## 2023-11-18 VITALS — BP 143/83 | HR 69

## 2023-11-18 DIAGNOSIS — H6123 Impacted cerumen, bilateral: Secondary | ICD-10-CM

## 2023-11-18 NOTE — Progress Notes (Signed)
 Patient ID: Jesus Juarez, male   DOB: June 27, 1950, 73 y.o.   MRN: [id number]  Procedure: Bilateral cerumen disimpaction.   Indication: Cerumen impaction, resulting in ear discomfort and conductive hearing loss.   Description: The patient is placed supine on the operating table. Under the operating microscope, the right ear canal is examined and is noted to be impacted with cerumen. The cerumen is carefully removed with a combination of suction catheters, cerumen curette, and alligator forceps. After the cerumen removal, the ear canal and tympanic membrane are noted to be normal. No middle ear effusion is noted. The same procedure is then repeated on the left side without exception. The patient tolerated the procedure well.  Follow-up care:  The patient is instructed not to use Q-tips to clean the ear canals. The patient will follow up in 4 months.

## 2023-12-03 DIAGNOSIS — R400 Somnolence: Secondary | ICD-10-CM | POA: Diagnosis not present

## 2023-12-03 DIAGNOSIS — G4733 Obstructive sleep apnea (adult) (pediatric): Secondary | ICD-10-CM | POA: Diagnosis not present

## 2023-12-05 DIAGNOSIS — H5203 Hypermetropia, bilateral: Secondary | ICD-10-CM | POA: Diagnosis not present

## 2023-12-05 DIAGNOSIS — H40033 Anatomical narrow angle, bilateral: Secondary | ICD-10-CM | POA: Diagnosis not present

## 2023-12-05 DIAGNOSIS — H2513 Age-related nuclear cataract, bilateral: Secondary | ICD-10-CM | POA: Diagnosis not present

## 2023-12-07 NOTE — Progress Notes (Unsigned)
 Patient ID: Jesus Juarez                 DOB: 20-Apr-1951                      MRN: [id number]      HPI: Jesus Juarez is a 73 y.o. male referred by Dr. Lavona  to HTN clinic. PMH is significant for hypertension, CAD, hx of  remote TIA, OSA, obesity, HLD.  Amlodipine  2.5 mg was added to his other meds at his visit with Dr.Hochrein on 09/01/2023  and later the dose was increased to 5 mg daily and his other BP meds includes chlorthalidone  25 mg daily   Current HTN meds: chlorthalidone  25 mg daily and amlodipine  5 mg daily  Previously tried:  BP goal: <130/80   Family History:   Social History:   Diet:   Exercise:  {types:28256}  Home BP readings:  Date SBP/DBP  HR              Average      Wt Readings from Last 3 Encounters:  10/12/23 234 lb 8 oz (106.4 kg)  09/01/23 237 lb (107.5 kg)  07/15/23 225 lb (102.1 kg)   BP Readings from Last 3 Encounters:  11/18/23 (!) 143/83  10/12/23 125/64  09/01/23 (!) 146/80   Pulse Readings from Last 3 Encounters:  11/18/23 69  10/12/23 65  09/01/23 68    Renal function: CrCl cannot be calculated (Patient's most recent lab result is older than the maximum 21 days allowed.).  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 s/p DES to RCA 03/2021, mild residual disease for med rx.   Cerebrovascular disease    History of cardiac catheterization    Normal coronaries - approximately 20 years ago   HLD (hyperlipidemia)    Hypertension    Incomplete right bundle branch block (RBBB)    Obstructive sleep apnea    CPAP   Vitamin D  deficiency     Current Outpatient Medications on File Prior to Visit  Medication Sig Dispense Refill   amLODipine  (NORVASC ) 5 MG tablet Take 1 tablet (5 mg total) by mouth daily. 90 tablet 3   B Complex Vitamins (VITAMIN-B COMPLEX PO) Take 1 tablet by mouth daily.      chlorthalidone  (HYGROTON ) 25 MG tablet Take 1 tablet (25 mg  total) by mouth daily. 90 tablet 3   cholecalciferol (VITAMIN D ) 1000 units tablet Take 1,000 Units by mouth 2 (two) times daily.     clopidogrel  (PLAVIX ) 75 MG tablet TAKE 1 TABLET BY MOUTH EVERY DAY 100 tablet 3   Evolocumab  (REPATHA  SURECLICK) 140 MG/ML SOAJ INJECT 140 MG INTO THE SKIN EVERY 14 (FOURTEEN) DAYS. 6 mL 3   Multiple Vitamin (MULTIVITAMIN) tablet Take 1 tablet by mouth daily.     nitroGLYCERIN  (NITROSTAT ) 0.4 MG SL tablet Place 1 tablet (0.4 mg total) under the tongue every 5 (five) minutes as needed for chest pain (up to 3 doses. If taking 3rd dose call 911). 25 tablet 3   vitamin C (ASCORBIC ACID) 500 MG tablet Take 500 mg by mouth daily.     Zinc Sulfate (ZINC 15 PO) Take by mouth.     No current facility-administered medications on file prior to visit.    Allergies  Allergen Reactions   Crestor  [Rosuvastatin  Calcium ] Other (See Comments)  myalgias   Penicillins Hives    Did it involve swelling of the face/tongue/throat, SOB, or low BP? No Did it involve sudden or severe rash/hives, skin peeling, or any reaction on the inside of your mouth or nose? Yes Did you need to seek medical attention at a hospital or doctor's office? Unknown When did it last happen?       If all above answers are "NO", may proceed with cephalosporin use.    Elemental Sulfur Rash   Sulfa Antibiotics Rash    There were no vitals taken for this visit.   Assessment/Plan:  1. Hypertension -  No problem-specific Assessment & Plan notes found for this encounter.      Thank you  Robbi Blanch, Pharm.D [facility] [address], [place], [place] [address] Phone: [phone]; Fax: [phone]

## 2023-12-08 ENCOUNTER — Ambulatory Visit: Attending: Cardiology | Admitting: Pharmacist

## 2023-12-08 ENCOUNTER — Encounter: Payer: Self-pay | Admitting: Pharmacist

## 2023-12-08 ENCOUNTER — Other Ambulatory Visit (HOSPITAL_COMMUNITY): Payer: Self-pay

## 2023-12-08 VITALS — BP 132/71 | HR 65

## 2023-12-08 DIAGNOSIS — I10 Essential (primary) hypertension: Secondary | ICD-10-CM

## 2023-12-08 MED ORDER — OMRON 3 SERIES BP MONITOR DEVI
1.0000 | Freq: Every day | 0 refills | Status: AC
  Filled 2023-12-08: qty 1, 30d supply, fill #0

## 2023-12-08 NOTE — Patient Instructions (Addendum)
 No Changes made to your BP meds by your pharmacist Robbi Blanch, PharmD at today's visit:     Bring all of your meds, your BP cuff and your record of home blood pressures to your next appointment.    HOW TO TAKE YOUR BLOOD PRESSURE AT HOME  Rest 5 minutes before taking your blood pressure.  Don't smoke or drink caffeinated beverages for at least 30 minutes before. Take your blood pressure before (not after) you eat. Sit comfortably with your back supported and both feet on the floor (don't cross your legs). Elevate your arm to heart level on a table or a desk. Use the proper sized cuff. It should fit smoothly and snugly around your bare upper arm. There should be enough room to slip a fingertip under the cuff. The bottom edge of the cuff should be 1 inch above the crease of the elbow. Ideally, take 3 measurements at one sitting and record the average.  Important lifestyle changes to control high blood pressure  Intervention  Effect on the BP  Lose extra pounds and watch your waistline Weight loss is one of the most effective lifestyle changes for controlling blood pressure. If you're overweight or obese, losing even a small amount of weight can help reduce blood pressure. Blood pressure might go down by about 1 millimeter of mercury (mm Hg) with each kilogram (about 2.2 pounds) of weight lost.  Exercise regularly As a general goal, aim for at least 30 minutes of moderate physical activity every day. Regular physical activity can lower high blood pressure by about 5 to 8 mm Hg.  Eat a healthy diet Eating a diet rich in whole grains, fruits, vegetables, and low-fat dairy products and low in saturated fat and cholesterol. A healthy diet can lower high blood pressure by up to 11 mm Hg.  Reduce salt (sodium) in your diet Even a small reduction of sodium in the diet can improve heart health and reduce high blood pressure by about 5 to 6 mm Hg.  Limit alcohol One drink equals 12 ounces of beer,  5 ounces of wine, or 1.5 ounces of 80-proof liquor.  Limiting alcohol to less than one drink a day for women or two drinks a day for men can help lower blood pressure by about 4 mm Hg.   If you have any questions or concerns please use My Chart to send questions or call the office at [phone]

## 2023-12-08 NOTE — Assessment & Plan Note (Signed)
 Assessment: In office BP 1st reading 144/79 and 2nd reading 132/71 mmHg heart rate 65 (goal <130/80). Takes the current BP meds regularly and tolerates them well without any side effects Denies SOB, palpitation, chest pain, headaches,or swelling Reiterated the importance of regular exercise and low salt diet   Home BP varies from 140/80 to 125/70 heart rate ~65-70 - BP monitor few years old and never validated   Plan:  No change to current BP meds  Continue taking chlorthalidone  25 mg daily and amlodipine  5 mg daily  In future if BP remains elevated can consider addition of ARB or ACEi  Pt to buy new BP monitor and bring it next OV for validation  Correct home BP measurement techniques reviewed with the pt  Patient to keep record of BP readings with heart rate and report to us  at the next visit Patient to see PharmD in 4 weeks for follow up  Follow up lab(s): none

## 2023-12-09 NOTE — Telephone Encounter (Signed)
 Per Dr Burnard order, New resmed AirSense with settings:  Mode: AutoSet  Min Pressure (cmH2O): 12.0  Max Pressure (cmH2O): 18.0  Response: Soft  EPR: Ramp Only  EPR level (cmH2O): 3  Ramp enable: Auto  Ramp time (min): 5  Start pressure (cmH2O): 4.0   Upon patient request DME selection is [facility] Patient understands he will be contacted by [facility] to set up his cpap. Patient understands to call if [facility] does not contact him with new setup in a timely manner. Patient understands they will be called once confirmation has been received from [facility] that they have received their new machine to schedule 10 week follow up appointment.   [facility] notified of new cpap order  Please add to airview Patient was grateful for the call and thanked me.

## 2023-12-14 DIAGNOSIS — H40031 Anatomical narrow angle, right eye: Secondary | ICD-10-CM | POA: Diagnosis not present

## 2023-12-21 DIAGNOSIS — H40032 Anatomical narrow angle, left eye: Secondary | ICD-10-CM | POA: Diagnosis not present

## 2023-12-25 ENCOUNTER — Telehealth: Payer: Self-pay | Admitting: Physician Assistant

## 2023-12-25 NOTE — Telephone Encounter (Signed)
 73 yo male w hx of CAD, HTN, OSA, LVH, prior TIA called the answering service b/c he took his Clopidogrel , Amlodipine , Chlorthalidone  twice today by mistake. He feels sleepy but no near syncope or syncope.  BP is 156/91  PLAN:  -I advised him to drink plenty of fluids and monitor his BP periodically. If he develops near syncope or syncope and his SBP is < 100, he should go to the ED. If his SBP is < 110 in the morning before his meds, he can skip Chlorthalidone  and Amlodipine  tomorrow and resume Tues.  Glendia Ferrier, PA-C    12/25/2023 3:18 PM

## 2023-12-27 ENCOUNTER — Telehealth: Payer: Self-pay | Admitting: Cardiology

## 2023-12-27 NOTE — Telephone Encounter (Signed)
 Patient called to confirm his appointment on 9/2 will need to be in in-office visit.

## 2023-12-27 NOTE — Telephone Encounter (Signed)
 Will forward to Dr Shlomo Frieze and sleep team for verify if appointment needs to be in office.

## 2023-12-28 NOTE — Telephone Encounter (Signed)
 LVM asking patient to call us  if he would like to change his appointment. Also sent [facility] message. Forwarded to scheduling.

## 2023-12-29 ENCOUNTER — Telehealth: Payer: Self-pay

## 2023-12-29 NOTE — Telephone Encounter (Signed)
 SABRA

## 2024-01-02 NOTE — Progress Notes (Unsigned)
 SLEEP MEDICINE VIRTUAL VISIT via Video Note   Because of Jesus Juarez's co-morbid illnesses, he is at least at moderate risk for complications without adequate follow up.  This format is felt to be most appropriate for this patient at this time.  All issues noted in this document were discussed and addressed.  A limited physical exam was performed with this format.  Please refer to the patient's chart for his consent to telehealth for [facility]    Date:  01/03/2024   ID:  Jesus Juarez, DOB 03/06/1951, MRN [id number] The patient was identified using 2 identifiers.  Patient Location: Home Provider Location: Home Office   PCP:  Cleotilde Planas, MD   [facility] Providers Cardiologist:  Lynwood Schilling, MD     Evaluation Performed:  Follow-Up Visit  Chief Complaint:  OSA  History of Present Illness:    Jesus Juarez is a 73 y.o. male with a hx of HTN, TIA (branch occlusion of LMCA) and OSA on CPAP.  He was dx with OSA over 25 years ago and has been on CPAP.  He had a sleep study done I 2017 showing severe OSA with [facility] 52/hr.  He was seen by Dr. Burnard 10/12/2023 and was using an old S9 Elite non-wireless CPAP with 100% compliance and on CPAP at 10cm H2O with AHI 6.3/hr. At last OV his CPAP was changed to auto from 12 to 18cm H2O and increased RAMP to .  He uses [facility] for DME. At last OV Dr. Burnard ordered a new CPAP for him. He is now here for followup to establish sleep care with me upon Dr. Joesphine retirement.  He is doing well with his PAP device and thinks that he has gotten used to it.  He tolerates the full face mask.  He feels that the pressure gets too high during the night and his mask will start to leak.  Since going on PAP he feels more rested in the am.  He sometimes gets sleepy during the day but does not have to take a nap during day.   He denies any significant mouth or nasal dryness or nasal congestion.  He does not think that he snores.   Patient denies any episodes of bruxism, restless legs, No gagging hallucinations or cataplectic events.     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 s/p DES to RCA 03/2021, mild residual disease for med rx.   Cerebrovascular disease    History of cardiac catheterization    Normal coronaries - approximately 20 years ago   HLD (hyperlipidemia)    Hypertension    Incomplete right bundle branch block (RBBB)    Obstructive sleep apnea    CPAP   Vitamin D  deficiency    Past Surgical History:  Procedure Laterality Date   CORONARY STENT INTERVENTION N/A 03/27/2021   Procedure: CORONARY STENT INTERVENTION;  Surgeon: Wonda Sharper, MD;  Location: [facility] INVASIVE CV LAB;  Service: Cardiovascular;  Laterality: N/A;   IR ANGIO INTRA EXTRACRAN SEL COM CAROTID INNOMINATE BILAT MOD SED  10/03/2018   IR ANGIO VERTEBRAL SEL SUBCLAVIAN INNOMINATE UNI L MOD SED  10/03/2018   IR ANGIO VERTEBRAL SEL VERTEBRAL UNI R MOD SED  10/03/2018   IR US  GUIDE VASC ACCESS RIGHT  10/03/2018   LEFT HEART CATH AND CORONARY ANGIOGRAPHY N/A 03/27/2021  Procedure: LEFT HEART CATH AND CORONARY ANGIOGRAPHY;  Surgeon: Wonda Sharper, MD;  Location[facility] INVASIVE CV LAB;  Service: Cardiovascular;  Laterality: N/A;     Current Meds  Medication Sig   amLODipine  (NORVASC ) 5 MG tablet Take 1 tablet (5 mg total) by mouth daily.   B Complex Vitamins (VITAMIN-B COMPLEX PO) Take 1 tablet by mouth daily.    Blood Pressure Monitoring (OMRON 3 SERIES BP MONITOR) DEVI Use to measure blood pressure as directed.   chlorthalidone  (HYGROTON ) 25 MG tablet Take 1 tablet (25 mg total) by mouth daily.   cholecalciferol (VITAMIN D ) 1000 units tablet Take 1,000 Units by mouth 2 (two) times daily.   clopidogrel  (PLAVIX ) 75 MG tablet TAKE 1 TABLET BY MOUTH EVERY DAY   Evolocumab  (REPATHA  SURECLICK) 140 MG/ML SOAJ INJECT 140 MG INTO THE SKIN EVERY 14 (FOURTEEN) DAYS.   Multiple Vitamin  (MULTIVITAMIN) tablet Take 1 tablet by mouth daily.   nitroGLYCERIN  (NITROSTAT ) 0.4 MG SL tablet Place 1 tablet (0.4 mg total) under the tongue every 5 (five) minutes as needed for chest pain (up to 3 doses. If taking 3rd dose call 911).   vitamin C (ASCORBIC ACID) 500 MG tablet Take 500 mg by mouth daily.   Zinc Sulfate (ZINC 15 PO) Take by mouth.     Allergies:   Crestor  [rosuvastatin  calcium ], Penicillins, Elemental sulfur, and Sulfa antibiotics   Social History   Tobacco Use   Smoking status: Former    Types: Cigarettes   Smokeless tobacco: Never   Tobacco comments:    tried as a teenager about a pack  Vaping Use   Vaping status: Never Used  Substance Use Topics   Alcohol use: Yes    Alcohol/week: 0.0 standard drinks of alcohol    Comment: occasional beer   Drug use: No     Family Hx: The patient's family history includes Breast cancer in his mother; Dementia in his mother; Heart disease in his maternal grandmother; Hypertension in his mother; Kidney disease in his maternal grandmother; Supraventricular tachycardia in his mother.  ROS:   Please see the history of present illness.     All other systems reviewed and are negative.   Prior Sleep studies:   The following studies were reviewed today:  PAP compliance download  Labs/Other Tests and Data Reviewed:     Recent Labs: 06/08/2023: ALT 15; BUN 17; Creatinine, Ser 1.06; Potassium 3.6; Sodium 139; TSH 2.500   Wt Readings from Last 3 Encounters:  01/03/24 225 lb (102.1 kg)  10/12/23 234 lb 8 oz (106.4 kg)  09/01/23 237 lb (107.5 kg)     Risk Assessment/Calculations:      Objective:    Vital Signs:  BP 121/73   Pulse 77   Ht 5' 11 (1.803 m)   Wt 225 lb (102.1 kg)   BMI 31.38 kg/m    VITAL SIGNS:  reviewed GEN:  no acute distress EYES:  sclerae anicteric, EOMI - Extraocular Movements Intact RESPIRATORY:  normal respiratory effort, symmetric expansion CARDIOVASCULAR:  no peripheral edema SKIN:  no  rash, lesions or ulcers. MUSCULOSKELETAL:  no obvious deformities. NEURO:  alert and oriented x 3, no obvious focal deficit PSYCH:  normal affect  ASSESSMENT & PLAN:    OSA - The patient is tolerating PAP therapy well without any problems. The PAP download performed by his DME was personally reviewed and interpreted by me today and showed an AHI of 1.6/hr on auto CPAP from 12-18 cm H2O with 100% compliance  in using more than 4 hours nightly.  The patient has been using and benefiting from PAP use and will continue to benefit from therapy.  -he complains that his mask leaks some on the higher pressure and his 95th% pressure is 15.6cm H2O so I will lower his pressure to 12-16cm H2O and get a download in 2 weeks  HTN -BP controlled on exam today -continue Amlodipine  5mg  daily, Chlorthalidone  25mg  daily with PRN refills    Total time of encounter: 20 minutes total time of encounter, including 15 minutes spent in face-to-face patient care on the date of this encounter. This time includes coordination of care and counseling regarding above mentioned problem list. Remainder of non-face-to-face time involved reviewing chart documents/testing relevant to the patient encounter and documentation in the medical record. I have independently reviewed documentation from referring provider.    Medication Adjustments/Labs and Tests Ordered: Current medicines are reviewed at length with the patient today.  Concerns regarding medicines are outlined above.   Tests Ordered: No orders of the defined types were placed in this encounter.   Medication Changes: No orders of the defined types were placed in this encounter.   Follow Up:  In Person in 1 year(s)  Signed, Wilbert Bihari, MD  01/03/2024 8:38 AM    [facility]

## 2024-01-03 ENCOUNTER — Ambulatory Visit: Attending: Cardiology | Admitting: Cardiology

## 2024-01-03 ENCOUNTER — Encounter: Payer: Self-pay | Admitting: Cardiology

## 2024-01-03 VITALS — BP 121/73 | HR 77 | Ht 71.0 in | Wt 225.0 lb

## 2024-01-03 DIAGNOSIS — G4733 Obstructive sleep apnea (adult) (pediatric): Secondary | ICD-10-CM | POA: Diagnosis not present

## 2024-01-03 DIAGNOSIS — I10 Essential (primary) hypertension: Secondary | ICD-10-CM

## 2024-01-03 DIAGNOSIS — R400 Somnolence: Secondary | ICD-10-CM | POA: Diagnosis not present

## 2024-01-03 NOTE — Patient Instructions (Signed)

## 2024-01-04 DIAGNOSIS — H2513 Age-related nuclear cataract, bilateral: Secondary | ICD-10-CM | POA: Diagnosis not present

## 2024-01-05 ENCOUNTER — Telehealth: Payer: Self-pay | Admitting: *Deleted

## 2024-01-05 DIAGNOSIS — Z0000 Encounter for general adult medical examination without abnormal findings: Secondary | ICD-10-CM | POA: Diagnosis not present

## 2024-01-05 DIAGNOSIS — G4733 Obstructive sleep apnea (adult) (pediatric): Secondary | ICD-10-CM

## 2024-01-05 DIAGNOSIS — Z1331 Encounter for screening for depression: Secondary | ICD-10-CM | POA: Diagnosis not present

## 2024-01-05 NOTE — Telephone Encounter (Signed)
 Per Dr Shlomo, please lower his pressure to 12-16cm H2O and get a download in 2 weeks   Order placed to  [facility]

## 2024-01-05 NOTE — Telephone Encounter (Signed)
-----   Message from Wilbert Bihari sent at 01/03/2024  8:39 AM EDT ----- he complains that his mask leaks some on the higher pressure and his 95th% pressure is 15.6cm H2O so please lower his pressure to 12-16cm H2O and get a download in 2 weeks

## 2024-01-08 ENCOUNTER — Encounter: Payer: Self-pay | Admitting: Cardiology

## 2024-01-12 NOTE — Progress Notes (Unsigned)
 Patient ID: Jesus Juarez                 DOB: 01-Apr-1951                      MRN: [id number]      HPI: Jesus Juarez is a 73 y.o. male referred by Dr. Lavona  to HTN clinic. PMH is significant for hypertension, CAD, hx of  remote TIA, OSA, obesity, HLD.  Amlodipine  2.5 mg was added to his other meds at his visit with Dr.Hochrein on 09/01/2023  and later the dose was increased to 5 mg daily and his other BP meds includes chlorthalidone  25 mg daily   Patient presented today for hypertension f/u. Reports home BP varies from 140/80 to 125/70 heart rate 70. He follows low salt diet and walks for 1 heart rate everyday. Does some resistance exercise 2-3 days per week but due to previous TIA afraid to do heavy weights. He notices mild swelling towards end of the day in his lower extremities - which improves with elevation. Overall able tolerate meds well.   Today at BP follow up visit patient brought in a home blood pressure log showing that morning readings are consistently at goal, while afternoon readings average around 149/78 mmHg. The accuracy of the home BP monitor was validated during the visit. The patient exercises regularly and adheres to a low-sodium diet. A few weeks ago, he experienced an episode of dizziness accompanied by a fluttering sensation in the chest, requiring him to steady himself using the kitchen counter. He reports having had similar episodes several years ago. He consumes two cups of decaffeinated coffee daily--one in the morning and one in the evening--and waits approximately two hours after his morning coffee before checking his blood pressure, but does not follow the same routine in the afternoon. He has noticed that his afternoon readings tend to be higher. The patient expressed concern about taking too many medications and a desire to reduce or discontinue some of them. We reviewed his entire medication list together and discussed the indication and importance of each  medication in managing his health. At this time, he is not ready to make any changes to his blood pressure regimen but is willing to continue focusing on lifestyle modifications for the next two weeks.  Current HTN meds: chlorthalidone  25 mg daily and amlodipine  5 mg daily  Previously tried: none  BP goal: <130/80   Family History:  Relation Problem Comments  Mother (Deceased) Breast cancer   Dementia   Hypertension   Supraventricular tachycardia     Father (Deceased)   Maternal Grandmother Heart disease   Kidney disease      Social History:  Alcohol: beer  Smoking: none  Diet:  Follows low salt diet and eats home cooked meals. Eats out twice a month with friends - mainly breakfast  Exercise: walks 60 min 7 days a week and resistance exercise 2-3 times per week.  HYPERTENSION CONTROL Vitals:   01/13/24 0836 01/13/24 0837  BP: (!) 165/80 (!) 158/77    The patient's blood pressure is elevated above target today.  In order to address the patient's elevated BP: Blood pressure will be monitored at home to determine if medication changes need to be made.     Home BP readings:  Morning   Evening    SBP DBP SBP  DBP  133 84 179 83  121 73 151 81  142 75 133 73  130 67 148 85  123 72 148 85  146 85 137 64  131 81 149.3333 78.5  133 78    127 75    131.7778 23.33332      Wt Readings from Last 3 Encounters:  01/03/24 225 lb (102.1 kg)  10/12/23 234 lb 8 oz (106.4 kg)  09/01/23 237 lb (107.5 kg)   BP Readings from Last 3 Encounters:  01/13/24 (!) 158/77  01/03/24 121/73  12/08/23 132/71   Pulse Readings from Last 3 Encounters:  01/13/24 66  01/03/24 77  12/08/23 65    Renal function: CrCl cannot be calculated (Patient's most recent lab result is older than the maximum 21 days allowed.).  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 s/p DES to RCA 03/2021, mild residual disease for med rx.    Cerebrovascular disease    History of cardiac catheterization    Normal coronaries - approximately 20 years ago   HLD (hyperlipidemia)    Hypertension    Incomplete right bundle branch block (RBBB)    Obstructive sleep apnea    CPAP   Vitamin D  deficiency     Current Outpatient Medications on File Prior to Visit  Medication Sig Dispense Refill   amLODipine  (NORVASC ) 5 MG tablet Take 1 tablet (5 mg total) by mouth daily. 90 tablet 3   B Complex Vitamins (VITAMIN-B COMPLEX PO) Take 1 tablet by mouth daily.      Blood Pressure Monitoring (OMRON 3 SERIES BP MONITOR) DEVI Use to measure blood pressure as directed. 1 each 0   chlorthalidone  (HYGROTON ) 25 MG tablet Take 1 tablet (25 mg total) by mouth daily. 90 tablet 3   cholecalciferol (VITAMIN D ) 1000 units tablet Take 1,000 Units by mouth 2 (two) times daily.     clopidogrel  (PLAVIX ) 75 MG tablet TAKE 1 TABLET BY MOUTH EVERY DAY 100 tablet 3   Evolocumab  (REPATHA  SURECLICK) 140 MG/ML SOAJ INJECT 140 MG INTO THE SKIN EVERY 14 (FOURTEEN) DAYS. 6 mL 3   Multiple Vitamin (MULTIVITAMIN) tablet Take 1 tablet by mouth daily.     nitroGLYCERIN  (NITROSTAT ) 0.4 MG SL tablet Place 1 tablet (0.4 mg total) under the tongue every 5 (five) minutes as needed for chest pain (up to 3 doses. If taking 3rd dose call 911). 25 tablet 3   vitamin C (ASCORBIC ACID) 500 MG tablet Take 500 mg by mouth daily.     Zinc Sulfate (ZINC 15 PO) Take by mouth.     No current facility-administered medications on file prior to visit.    Allergies  Allergen Reactions   Crestor  [Rosuvastatin  Calcium ] Other (See Comments)    myalgias   Penicillins Hives    Did it involve swelling of the face/tongue/throat, SOB, or low BP? No Did it involve sudden or severe rash/hives, skin peeling, or any reaction on the inside of your mouth or nose? Yes Did you need to seek medical attention at a hospital or doctor's office? Unknown When did it last happen?       If all above answers  are "NO", may proceed with cephalosporin use.    Elemental Sulfur Rash   Sulfa Antibiotics Rash    Blood pressure (!) 158/77, pulse 66, SpO2 98%.   Assessment/Plan:  1. Hypertension -  Essential hypertension Assessment: In office BP 1st reading 165/80 and 2nd reading 158/77 mmHg heart rate 71 (goal <  130/80). Takes the current BP meds regularly and tolerates them well without any side effects Denies SOB, palpitation, chest pain, headaches,or swelling Reiterated the importance of regular exercise and low salt diet  morning readings are consistently at goal, while afternoon readings average around 149/78 mmHg. The accuracy of the home BP monitor was validated during the visit- found to be accurate   Plan:  Pt want to wait for 2 more week before we change any meds he want to cut down on coffee to see if that would make any difference to the BP  Continue taking chlorthalidone  25 mg daily and amlodipine  5 mg daily  Pt to call back in 2 weeks with BP readings - will add irbesartan 150 mg daily to his other BP meds if BP remains above the goal       Thank you  Robbi Blanch, Pharm.D [facility]. [facility] [address], [place], [place] [address] Phone: [phone]; Fax: [phone]

## 2024-01-13 ENCOUNTER — Encounter: Payer: Self-pay | Admitting: Pharmacist

## 2024-01-13 ENCOUNTER — Ambulatory Visit: Attending: Cardiology | Admitting: Pharmacist

## 2024-01-13 VITALS — BP 158/77 | HR 66

## 2024-01-13 DIAGNOSIS — I10 Essential (primary) hypertension: Secondary | ICD-10-CM | POA: Diagnosis not present

## 2024-01-13 NOTE — Assessment & Plan Note (Addendum)
 Assessment: In office BP 1st reading 165/80 and 2nd reading 158/77 mmHg heart rate 71 (goal <130/80). Takes the current BP meds regularly and tolerates them well without any side effects Denies SOB, palpitation, chest pain, headaches,or swelling Reiterated the importance of regular exercise and low salt diet  morning readings are consistently at goal, while afternoon readings average around 149/78 mmHg. The accuracy of the home BP monitor was validated during the visit- found to be accurate   Plan:  Pt want to wait for 2 more week before we change any meds he want to cut down on coffee to see if that would make any difference to the BP  Continue taking chlorthalidone  25 mg daily and amlodipine  5 mg daily  Pt to call back in 2 weeks with BP readings - will add irbesartan 150 mg daily to his other BP meds if BP remains above the goal

## 2024-01-16 DIAGNOSIS — R972 Elevated prostate specific antigen [PSA]: Secondary | ICD-10-CM | POA: Diagnosis not present

## 2024-01-25 ENCOUNTER — Telehealth: Payer: Self-pay | Admitting: Pharmacist

## 2024-01-25 DIAGNOSIS — I10 Essential (primary) hypertension: Secondary | ICD-10-CM

## 2024-01-25 NOTE — Telephone Encounter (Signed)
 Patient called to report home BP readings  date  AM SBP DBP HR PM SBP  DBP HR  12-Sep 121 77 81 142 74 73  13-Sep 134 79 65 126 74 71  14-Sep 128 78 72 133 76 90  15-Sep 110 78 74 130 78 75  16-Sep 137 77 74 142 87 74  17-Sep 130 78 70 141 89 89  18-Sep 129 75 91 131 81 82  19-Sep 132 78 83 132 78 83  20-Sep 128 80 76 128 72 75  21-Sep 124 78 75 134 80 76  22-Sep 116 73 76 142 70 74  23-Sep 135 74 72 130 76 79  24-Sep 114 72 85 134.25 77.91667 78.41667   [phone] 76.46154      Evening BP still average above the goal. Will share more readings via MyChart in 2 weeks ( amlodipine  current dose causing mild lower extremities swelling) next option can be irbesartan 150 mg. F/u due in 2 weeks via MyChart

## 2024-02-02 DIAGNOSIS — R400 Somnolence: Secondary | ICD-10-CM | POA: Diagnosis not present

## 2024-02-02 DIAGNOSIS — G4733 Obstructive sleep apnea (adult) (pediatric): Secondary | ICD-10-CM | POA: Diagnosis not present

## 2024-02-10 NOTE — Telephone Encounter (Signed)
 Home BP readings reviewed over the phone. Given few above goal BP readings and mild lower extremities swelling from amlodipine  suggested to change amlodipine  to irbesartan 150 mg daily. Patient will think about it and let us  know Monday Feb 13, 2024.

## 2024-02-13 MED ORDER — IRBESARTAN 150 MG PO TABS: 150.0000 mg | ORAL_TABLET | Freq: Every day | ORAL | 3 refills | Status: AC

## 2024-02-13 NOTE — Addendum Note (Signed)
 Addended by: Seretha Estabrooks K on: 02/13/2024 11:23 AM   Modules accepted: Orders

## 2024-02-13 NOTE — Telephone Encounter (Signed)
 Spoke to patient, patient is in agreement to switch amlodipine  to irbesartan 150 mg daily ( will start from tomorrow) f/u BMP will due on Feb 27, 2024

## 2024-02-16 ENCOUNTER — Ambulatory Visit (INDEPENDENT_AMBULATORY_CARE_PROVIDER_SITE_OTHER): Admitting: Otolaryngology

## 2024-02-16 ENCOUNTER — Encounter (INDEPENDENT_AMBULATORY_CARE_PROVIDER_SITE_OTHER): Payer: Self-pay | Admitting: Otolaryngology

## 2024-02-16 VITALS — BP 144/78 | HR 84 | Temp 97.9°F

## 2024-02-16 DIAGNOSIS — H6123 Impacted cerumen, bilateral: Secondary | ICD-10-CM | POA: Diagnosis not present

## 2024-02-16 NOTE — Progress Notes (Signed)
 Patient ID: Jesus Juarez, male   DOB: Dec 01, 1950, 73 y.o.   MRN: [id number]  Procedure: Bilateral cerumen disimpaction.   Indication: Cerumen impaction, resulting in ear discomfort and conductive hearing loss.   Description: The patient is placed supine on the operating table. Under the operating microscope, the right ear canal is examined and is noted to be impacted with cerumen. The cerumen is carefully removed with a combination of suction catheters, cerumen curette, and alligator forceps. After the cerumen removal, the ear canal and tympanic membrane are noted to be normal. No middle ear effusion is noted. The same procedure is then repeated on the left side without exception. The patient tolerated the procedure well.  Follow-up care: The patient will follow up in 3 months.

## 2024-02-21 NOTE — Telephone Encounter (Signed)
 Call to discuss BP readings. N/A LVM per DPR

## 2024-02-28 LAB — BASIC METABOLIC PANEL WITH GFR
BUN/Creatinine Ratio: 16 (ref 10–24)
BUN: 18 mg/dL (ref 8–27)
CO2: 21 mmol/L (ref 20–29)
Calcium: 9.5 mg/dL (ref 8.6–10.2)
Chloride: 99 mmol/L (ref 96–106)
Creatinine, Ser: 1.11 mg/dL (ref 0.76–1.27)
Glucose: 112 mg/dL — ABNORMAL HIGH (ref 70–99)
Potassium: 4.2 mmol/L (ref 3.5–5.2)
Sodium: 139 mmol/L (ref 134–144)
eGFR: 71 mL/min/1.73 (ref >59)

## 2024-02-29 ENCOUNTER — Ambulatory Visit: Payer: Self-pay | Admitting: Pharmacist

## 2024-03-04 DIAGNOSIS — R400 Somnolence: Secondary | ICD-10-CM | POA: Diagnosis not present

## 2024-03-08 DIAGNOSIS — H25811 Combined forms of age-related cataract, right eye: Secondary | ICD-10-CM | POA: Diagnosis not present

## 2024-03-08 DIAGNOSIS — Z961 Presence of intraocular lens: Secondary | ICD-10-CM | POA: Diagnosis not present

## 2024-03-08 DIAGNOSIS — H2511 Age-related nuclear cataract, right eye: Secondary | ICD-10-CM | POA: Diagnosis not present

## 2024-03-12 DIAGNOSIS — D1801 Hemangioma of skin and subcutaneous tissue: Secondary | ICD-10-CM | POA: Diagnosis not present

## 2024-03-12 DIAGNOSIS — B079 Viral wart, unspecified: Secondary | ICD-10-CM | POA: Diagnosis not present

## 2024-03-12 DIAGNOSIS — L821 Other seborrheic keratosis: Secondary | ICD-10-CM | POA: Diagnosis not present

## 2024-03-12 DIAGNOSIS — L814 Other melanin hyperpigmentation: Secondary | ICD-10-CM | POA: Diagnosis not present

## 2024-03-12 DIAGNOSIS — D492 Neoplasm of unspecified behavior of bone, soft tissue, and skin: Secondary | ICD-10-CM | POA: Diagnosis not present

## 2024-03-12 DIAGNOSIS — L570 Actinic keratosis: Secondary | ICD-10-CM | POA: Diagnosis not present

## 2024-03-15 DIAGNOSIS — Z961 Presence of intraocular lens: Secondary | ICD-10-CM | POA: Diagnosis not present

## 2024-03-15 DIAGNOSIS — H2512 Age-related nuclear cataract, left eye: Secondary | ICD-10-CM | POA: Diagnosis not present

## 2024-03-15 DIAGNOSIS — H25812 Combined forms of age-related cataract, left eye: Secondary | ICD-10-CM | POA: Diagnosis not present

## 2024-03-23 ENCOUNTER — Ambulatory Visit (INDEPENDENT_AMBULATORY_CARE_PROVIDER_SITE_OTHER): Admitting: Otolaryngology

## 2024-04-03 DIAGNOSIS — R400 Somnolence: Secondary | ICD-10-CM | POA: Diagnosis not present

## 2024-04-03 DIAGNOSIS — G4733 Obstructive sleep apnea (adult) (pediatric): Secondary | ICD-10-CM | POA: Diagnosis not present

## 2024-04-08 ENCOUNTER — Other Ambulatory Visit: Payer: Self-pay | Admitting: Cardiology

## 2024-04-08 DIAGNOSIS — I700 Atherosclerosis of aorta: Secondary | ICD-10-CM

## 2024-04-08 DIAGNOSIS — E785 Hyperlipidemia, unspecified: Secondary | ICD-10-CM

## 2024-04-08 DIAGNOSIS — I25118 Atherosclerotic heart disease of native coronary artery with other forms of angina pectoris: Secondary | ICD-10-CM

## 2024-04-13 DIAGNOSIS — Z961 Presence of intraocular lens: Secondary | ICD-10-CM | POA: Diagnosis not present

## 2024-04-24 DIAGNOSIS — Z974 Presence of external hearing-aid: Secondary | ICD-10-CM | POA: Diagnosis not present

## 2024-04-24 DIAGNOSIS — H6123 Impacted cerumen, bilateral: Secondary | ICD-10-CM | POA: Diagnosis not present

## 2024-04-24 DIAGNOSIS — H9042 Sensorineural hearing loss, unilateral, left ear, with unrestricted hearing on the contralateral side: Secondary | ICD-10-CM | POA: Diagnosis not present

## 2024-06-01 ENCOUNTER — Ambulatory Visit: Attending: Cardiology | Admitting: Cardiology

## 2024-06-01 ENCOUNTER — Encounter: Payer: Self-pay | Admitting: Cardiology

## 2024-06-01 VITALS — BP 159/67 | HR 78 | Ht 71.0 in | Wt 229.0 lb

## 2024-06-01 DIAGNOSIS — I2510 Atherosclerotic heart disease of native coronary artery without angina pectoris: Secondary | ICD-10-CM

## 2024-06-01 DIAGNOSIS — R0602 Shortness of breath: Secondary | ICD-10-CM

## 2024-06-01 DIAGNOSIS — E785 Hyperlipidemia, unspecified: Secondary | ICD-10-CM | POA: Diagnosis not present

## 2024-06-01 DIAGNOSIS — I10 Essential (primary) hypertension: Secondary | ICD-10-CM

## 2024-06-01 DIAGNOSIS — I517 Cardiomegaly: Secondary | ICD-10-CM

## 2024-06-01 NOTE — Patient Instructions (Signed)
 Medication Instructions:  Your physician recommends that you continue on your current medications as directed. Please refer to the Current Medication list given to you today.  *If you need a refill on your cardiac medications before your next appointment, please call your pharmacy*  Lab Work: Fasting lipid panel, BNP at [facility] If you have labs (blood work) drawn today and your tests are completely normal, you will receive your results only by: MyChart Message (if you have MyChart) OR A paper copy in the mail If you have any lab test that is abnormal or we need to change your treatment, we will call you to review the results.  Testing/Procedures: Pulmonary Function Test   Follow-Up: At [facility], you and your health needs are our priority.  As part of our continuing mission to provide you with exceptional heart care, our providers are all part of one team.  This team includes your primary Cardiologist (physician) and Advanced Practice Providers or APPs (Physician Assistants and Nurse Practitioners) who all work together to provide you with the care you need, when you need it.  Your next appointment:   2 month(s)  Provider:   Lynwood Schilling, MD    We recommend signing up for the patient portal called MyChart.  Sign up information is provided on this After Visit Summary.  MyChart is used to connect with patients for Virtual Visits (Telemedicine).  Patients are able to view lab/test results, encounter notes, upcoming appointments, etc.  Non-urgent messages can be sent to your provider as well.   To learn more about what you can do with MyChart, go to [email or web address].

## 2024-06-08 ENCOUNTER — Ambulatory Visit (INDEPENDENT_AMBULATORY_CARE_PROVIDER_SITE_OTHER): Admitting: Otolaryngology

## 2024-07-05 ENCOUNTER — Encounter (HOSPITAL_COMMUNITY)

## 2024-08-10 ENCOUNTER — Ambulatory Visit: Admitting: Cardiology
